# Patient Record
Sex: Female | Born: 1955 | Race: White | Hispanic: No | Marital: Married | Smoking: Current every day smoker
Health system: Southern US, Community
[De-identification: ages and names within clinical notes are randomized; demographics above are authoritative.]

## PROBLEM LIST (undated history)

## (undated) DIAGNOSIS — R000 Tachycardia, unspecified: Secondary | ICD-10-CM

## (undated) DIAGNOSIS — F32A Depression, unspecified: Secondary | ICD-10-CM

## (undated) DIAGNOSIS — N3946 Mixed incontinence: Secondary | ICD-10-CM

## (undated) DIAGNOSIS — R5383 Other fatigue: Secondary | ICD-10-CM

## (undated) DIAGNOSIS — G2581 Restless legs syndrome: Secondary | ICD-10-CM

## (undated) DIAGNOSIS — Z860101 Personal history of adenomatous and serrated colon polyps: Secondary | ICD-10-CM

## (undated) DIAGNOSIS — E785 Hyperlipidemia, unspecified: Secondary | ICD-10-CM

## (undated) DIAGNOSIS — Z8709 Personal history of other diseases of the respiratory system: Secondary | ICD-10-CM

## (undated) DIAGNOSIS — Z87448 Personal history of other diseases of urinary system: Secondary | ICD-10-CM

## (undated) DIAGNOSIS — N200 Calculus of kidney: Secondary | ICD-10-CM

## (undated) DIAGNOSIS — R058 Other specified cough: Secondary | ICD-10-CM

## (undated) DIAGNOSIS — Z8659 Personal history of other mental and behavioral disorders: Secondary | ICD-10-CM

## (undated) DIAGNOSIS — J449 Chronic obstructive pulmonary disease, unspecified: Secondary | ICD-10-CM

## (undated) DIAGNOSIS — Z973 Presence of spectacles and contact lenses: Secondary | ICD-10-CM

## (undated) DIAGNOSIS — D494 Neoplasm of unspecified behavior of bladder: Secondary | ICD-10-CM

## (undated) DIAGNOSIS — R3915 Urgency of urination: Secondary | ICD-10-CM

## (undated) DIAGNOSIS — F329 Major depressive disorder, single episode, unspecified: Secondary | ICD-10-CM

## (undated) DIAGNOSIS — R05 Cough: Secondary | ICD-10-CM

## (undated) DIAGNOSIS — F411 Generalized anxiety disorder: Secondary | ICD-10-CM

## (undated) DIAGNOSIS — J441 Chronic obstructive pulmonary disease with (acute) exacerbation: Secondary | ICD-10-CM

## (undated) DIAGNOSIS — I479 Paroxysmal tachycardia, unspecified: Secondary | ICD-10-CM

## (undated) DIAGNOSIS — G629 Polyneuropathy, unspecified: Secondary | ICD-10-CM

## (undated) DIAGNOSIS — N281 Cyst of kidney, acquired: Secondary | ICD-10-CM

## (undated) DIAGNOSIS — Z86010 Personal history of colonic polyps: Secondary | ICD-10-CM

## (undated) DIAGNOSIS — Z87442 Personal history of urinary calculi: Secondary | ICD-10-CM

## (undated) HISTORY — PX: EXTRACORPOREAL SHOCK WAVE LITHOTRIPSY: SHX1557

## (undated) HISTORY — DX: Restless legs syndrome: G25.81

## (undated) HISTORY — DX: Other fatigue: R53.83

## (undated) HISTORY — DX: Depression, unspecified: F32.A

## (undated) HISTORY — DX: Tachycardia, unspecified: R00.0

## (undated) HISTORY — DX: Major depressive disorder, single episode, unspecified: F32.9

## (undated) HISTORY — PX: OTHER SURGICAL HISTORY: SHX169

---
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
## 1981-09-07 HISTORY — PX: ABDOMINAL HYSTERECTOMY: SHX81

## 1990-09-07 HISTORY — PX: CARPAL TUNNEL RELEASE: SHX101

## 1992-09-07 HISTORY — PX: KNEE ARTHROSCOPY: SUR90

## 1992-09-07 HISTORY — PX: ULNAR NERVE REPAIR: SHX2594

## 1997-12-21 ENCOUNTER — Ambulatory Visit (HOSPITAL_BASED_OUTPATIENT_CLINIC_OR_DEPARTMENT_OTHER): Admission: RE | Admit: 1997-12-21 | Discharge: 1997-12-21 | Payer: Self-pay | Admitting: *Deleted

## 2000-12-22 ENCOUNTER — Ambulatory Visit (HOSPITAL_BASED_OUTPATIENT_CLINIC_OR_DEPARTMENT_OTHER): Admission: RE | Admit: 2000-12-22 | Discharge: 2000-12-22 | Payer: Self-pay | Admitting: *Deleted

## 2000-12-22 ENCOUNTER — Encounter (INDEPENDENT_AMBULATORY_CARE_PROVIDER_SITE_OTHER): Payer: Self-pay | Admitting: Specialist

## 2001-02-17 ENCOUNTER — Encounter: Payer: Self-pay | Admitting: Family Medicine

## 2001-02-17 ENCOUNTER — Encounter: Admission: RE | Admit: 2001-02-17 | Discharge: 2001-02-17 | Payer: Self-pay | Admitting: Family Medicine

## 2001-03-23 HISTORY — PX: OTHER SURGICAL HISTORY: SHX169

## 2001-08-29 ENCOUNTER — Encounter: Payer: Self-pay | Admitting: Family Medicine

## 2001-08-29 ENCOUNTER — Encounter: Admission: RE | Admit: 2001-08-29 | Discharge: 2001-08-29 | Payer: Self-pay | Admitting: Family Medicine

## 2001-09-01 ENCOUNTER — Encounter: Admission: RE | Admit: 2001-09-01 | Discharge: 2001-09-01 | Payer: Self-pay | Admitting: Neurosurgery

## 2001-09-01 ENCOUNTER — Encounter: Payer: Self-pay | Admitting: Neurosurgery

## 2001-09-16 ENCOUNTER — Encounter: Admission: RE | Admit: 2001-09-16 | Discharge: 2001-09-16 | Payer: Self-pay | Admitting: Neurosurgery

## 2001-09-16 ENCOUNTER — Encounter: Payer: Self-pay | Admitting: Neurosurgery

## 2001-10-03 ENCOUNTER — Encounter: Admission: RE | Admit: 2001-10-03 | Discharge: 2001-10-03 | Payer: Self-pay | Admitting: Neurosurgery

## 2001-10-03 ENCOUNTER — Encounter: Payer: Self-pay | Admitting: Neurosurgery

## 2002-01-12 ENCOUNTER — Encounter: Payer: Self-pay | Admitting: Family Medicine

## 2002-01-12 ENCOUNTER — Encounter: Admission: RE | Admit: 2002-01-12 | Discharge: 2002-01-12 | Payer: Self-pay | Admitting: Family Medicine

## 2006-04-22 ENCOUNTER — Ambulatory Visit: Payer: Self-pay | Admitting: Family Medicine

## 2006-06-09 ENCOUNTER — Ambulatory Visit: Payer: Self-pay | Admitting: Family Medicine

## 2006-09-30 ENCOUNTER — Ambulatory Visit (HOSPITAL_COMMUNITY): Admission: RE | Admit: 2006-09-30 | Discharge: 2006-09-30 | Payer: Self-pay | Admitting: Urology

## 2007-03-18 ENCOUNTER — Encounter: Admission: RE | Admit: 2007-03-18 | Discharge: 2007-03-18 | Payer: Self-pay | Admitting: Family Medicine

## 2011-01-23 NOTE — Op Note (Signed)
[place]  Patient:    Gabrielle Gallegos, Gabrielle Gallegos                     MRN: [id number] Proc. Date: 12/22/00 Adm. Date:  [id number] Attending:  Kendell Bane                           Operative Report  PREOPERATIVE DIAGNOSIS:  Mass, right little finger.  POSTOPERATIVE DIAGNOSIS:  Ganglion cyst, right little finger, proximal interphalangeal joint.  OPERATION PERFORMED:  Excision of ganglion, right little finger.  SURGEON:  Lowell Bouton, M.D.  ANESTHESIA:  0.5% Marcaine local with sedation.  OPERATIVE FINDINGS:  The patient had a small nodular mass over the dorsal ulnar aspect of the PIP joint of the right little finger.  DESCRIPTION OF PROCEDURE:  Under 0.5% Marcaine local anesthesia, with the tourniquet on the right arm, the right hand was prepped and draped in the usual fashion and after exsanguinating the limb the tourniquet was inflated to 250 mmHg.  A transverse incision was made across the dorsum of the PIP joint and blunt dissection carried through the subcutaneous tissues.  The ganglion was identified between the central slip and the ulnar lateral band.  It was excised along with a portion of the capsule.  The joint was examined and was found to be fairly smooth beneath the cyst.  The wound was then irrigated with saline.  The skin was closed with a 4-0 subcuticular Prolene.  Sterile dressings were applied and the tourniquet as released with good circulation to the hand.  The patient went to the recovery room awake and stable in good condition. DD:  12/22/00 TD:  12/22/00 Job: [id number]

## 2011-02-12 ENCOUNTER — Other Ambulatory Visit: Payer: Self-pay

## 2011-02-12 ENCOUNTER — Other Ambulatory Visit: Payer: Self-pay | Admitting: Family Medicine

## 2011-02-12 DIAGNOSIS — R110 Nausea: Secondary | ICD-10-CM

## 2011-02-13 ENCOUNTER — Other Ambulatory Visit: Payer: Self-pay

## 2011-02-13 ENCOUNTER — Ambulatory Visit
Admission: RE | Admit: 2011-02-13 | Discharge: 2011-02-13 | Disposition: A | Discharge status: Home / Self Care | Payer: Self-pay | Source: Ambulatory Visit | Attending: Family Medicine | Admitting: Family Medicine

## 2011-02-13 DIAGNOSIS — R110 Nausea: Secondary | ICD-10-CM

## 2011-02-23 ENCOUNTER — Other Ambulatory Visit: Payer: Self-pay | Admitting: Family Medicine

## 2011-02-25 ENCOUNTER — Other Ambulatory Visit: Payer: BC Managed Care – PPO

## 2012-06-21 ENCOUNTER — Other Ambulatory Visit: Payer: Self-pay | Admitting: Internal Medicine

## 2012-12-02 ENCOUNTER — Telehealth: Payer: Self-pay | Admitting: Nurse Practitioner

## 2012-12-02 ENCOUNTER — Other Ambulatory Visit: Payer: Self-pay | Admitting: Nurse Practitioner

## 2012-12-02 NOTE — Telephone Encounter (Signed)
Pt instructed to call pharmacy and have them send request over for refill and can take up to 72 hours. Pt verbalized understanding.

## 2012-12-05 ENCOUNTER — Encounter: Payer: Self-pay | Admitting: *Deleted

## 2012-12-05 NOTE — Telephone Encounter (Signed)
Last seen 09/08/12, last filled 10/31/12

## 2012-12-05 NOTE — Telephone Encounter (Signed)
This encounter was created in error - please disregard.

## 2012-12-27 ENCOUNTER — Other Ambulatory Visit: Payer: Self-pay | Admitting: Nurse Practitioner

## 2012-12-28 NOTE — Telephone Encounter (Signed)
PLEASE PRINT. CALL PT WHEN READY. LAST REFILL 11/05/12. LAST OV 1/14 WITH [facility].

## 2012-12-28 NOTE — Telephone Encounter (Signed)
Let patient know that RX ready for pick up

## 2012-12-28 NOTE — Telephone Encounter (Signed)
Patient aware.

## 2013-01-03 ENCOUNTER — Telehealth: Payer: Self-pay | Admitting: Nurse Practitioner

## 2013-01-03 NOTE — Telephone Encounter (Signed)
Mmm to address 

## 2013-01-03 NOTE — Telephone Encounter (Signed)
NTBS to discuss 

## 2013-01-04 ENCOUNTER — Telehealth: Payer: Self-pay | Admitting: *Deleted

## 2013-01-04 NOTE — Telephone Encounter (Signed)
Appt given to come in on 5-1 to discuss with MMM

## 2013-01-05 ENCOUNTER — Encounter: Payer: Self-pay | Admitting: Nurse Practitioner

## 2013-01-05 ENCOUNTER — Ambulatory Visit (INDEPENDENT_AMBULATORY_CARE_PROVIDER_SITE_OTHER): Payer: BC Managed Care – PPO | Admitting: Nurse Practitioner

## 2013-01-05 VITALS — BP 135/89 | HR 116 | Temp 97.7°F | Ht 63.75 in | Wt 145.0 lb

## 2013-01-05 DIAGNOSIS — G43909 Migraine, unspecified, not intractable, without status migrainosus: Secondary | ICD-10-CM | POA: Insufficient documentation

## 2013-01-05 DIAGNOSIS — M4802 Spinal stenosis, cervical region: Secondary | ICD-10-CM

## 2013-01-05 DIAGNOSIS — F32A Depression, unspecified: Secondary | ICD-10-CM | POA: Insufficient documentation

## 2013-01-05 DIAGNOSIS — F329 Major depressive disorder, single episode, unspecified: Secondary | ICD-10-CM

## 2013-01-05 NOTE — Progress Notes (Signed)
  Subjective:    Patient ID: Gabrielle Gallegos, female    DOB: Jun 08, 1956, 57 y.o.   MRN: [id number]  HPI Patient had Cervical disc surgery in February of 2013. Note was written by neurosurgeon stating that she needed help caring for her granddaughter whom is handicapped. [employer] only has down that she needs 24 hrs a week of help . She is here staing she needs to up the hours because she is having constatnt back pain and cervical neck pain from having to lift granddaughter to provide ADL.    Review of Systems  All other systems reviewed and are negative.       Objective:   Physical Exam  NO PHYSICAL EXAM DONE      Assessment & Plan:  LETTER WRITTEN FOR {ATIENT TO IVE TO [employer].

## 2013-01-18 ENCOUNTER — Telehealth: Payer: Self-pay | Admitting: Nurse Practitioner

## 2013-01-18 MED ORDER — HYDROCODONE-ACETAMINOPHEN 5-325 MG PO TABS: 1.0000 | ORAL_TABLET | Freq: Two times a day (BID) | ORAL | Status: DC

## 2013-01-18 NOTE — Telephone Encounter (Signed)
Patient aware.

## 2013-01-18 NOTE — Telephone Encounter (Signed)
RX ready for pick-up. Inform patient that she is only to get pain meds from me and no other office.

## 2013-01-18 NOTE — Telephone Encounter (Signed)
Mmm to address 

## 2013-02-15 ENCOUNTER — Telehealth: Payer: Self-pay | Admitting: Nurse Practitioner

## 2013-02-15 MED ORDER — HYDROCODONE-ACETAMINOPHEN 5-325 MG PO TABS: 1.0000 | ORAL_TABLET | Freq: Two times a day (BID) | ORAL | Status: DC

## 2013-02-15 NOTE — Telephone Encounter (Signed)
rx ready for pickup 

## 2013-02-15 NOTE — Telephone Encounter (Signed)
Pt aware - rx up front 

## 2013-03-16 ENCOUNTER — Telehealth: Payer: Self-pay | Admitting: Nurse Practitioner

## 2013-03-17 ENCOUNTER — Other Ambulatory Visit: Payer: Self-pay | Admitting: *Deleted

## 2013-03-17 MED ORDER — HYDROCODONE-ACETAMINOPHEN 5-325 MG PO TABS: 1.0000 | ORAL_TABLET | Freq: Two times a day (BID) | ORAL | Status: DC

## 2013-03-17 NOTE — Telephone Encounter (Signed)
rx ready for pickup 

## 2013-03-17 NOTE — Telephone Encounter (Signed)
Last filled 02/15/13, last seen 01/05/13. Call pt to pickup if approved

## 2013-03-17 NOTE — Telephone Encounter (Signed)
Aware. 

## 2013-04-17 ENCOUNTER — Other Ambulatory Visit: Payer: Self-pay | Admitting: Nurse Practitioner

## 2013-04-17 MED ORDER — HYDROCODONE-ACETAMINOPHEN 5-325 MG PO TABS: 1.0000 | ORAL_TABLET | Freq: Two times a day (BID) | ORAL | Status: DC

## 2013-04-17 NOTE — Telephone Encounter (Signed)
Last seen 01/05/13, last filled 03/17/13. Will print, call pt when ready

## 2013-04-17 NOTE — Telephone Encounter (Signed)
rx ready for pickup 

## 2013-04-17 NOTE — Telephone Encounter (Signed)
Pt aware to pick up rx 

## 2013-05-18 ENCOUNTER — Other Ambulatory Visit: Payer: Self-pay | Admitting: Nurse Practitioner

## 2013-05-19 MED ORDER — HYDROCODONE-ACETAMINOPHEN 5-325 MG PO TABS: 1.0000 | ORAL_TABLET | Freq: Two times a day (BID) | ORAL | Status: DC

## 2013-05-19 NOTE — Telephone Encounter (Signed)
rx ready for pick up NTBS for future refills 

## 2013-05-19 NOTE — Telephone Encounter (Signed)
Last seen 01/05/13, last filled 04/17/13, will print call pt to pickup

## 2013-05-22 NOTE — Telephone Encounter (Signed)
Rx up front. Patient notified 

## 2013-06-19 ENCOUNTER — Encounter (INDEPENDENT_AMBULATORY_CARE_PROVIDER_SITE_OTHER): Payer: Self-pay

## 2013-06-19 ENCOUNTER — Ambulatory Visit (INDEPENDENT_AMBULATORY_CARE_PROVIDER_SITE_OTHER): Payer: BC Managed Care – PPO

## 2013-06-19 ENCOUNTER — Encounter: Payer: Self-pay | Admitting: Family Medicine

## 2013-06-19 ENCOUNTER — Ambulatory Visit (INDEPENDENT_AMBULATORY_CARE_PROVIDER_SITE_OTHER): Payer: BC Managed Care – PPO | Admitting: Family Medicine

## 2013-06-19 VITALS — BP 106/69 | HR 110 | Temp 98.2°F | Ht 64.0 in | Wt 147.0 lb

## 2013-06-19 DIAGNOSIS — R059 Cough, unspecified: Secondary | ICD-10-CM

## 2013-06-19 DIAGNOSIS — J209 Acute bronchitis, unspecified: Secondary | ICD-10-CM

## 2013-06-19 DIAGNOSIS — F17200 Nicotine dependence, unspecified, uncomplicated: Secondary | ICD-10-CM

## 2013-06-19 DIAGNOSIS — R05 Cough: Secondary | ICD-10-CM

## 2013-06-19 MED ORDER — BENZONATATE 200 MG PO CAPS: 200.0000 mg | ORAL_CAPSULE | Freq: Two times a day (BID) | ORAL | Status: DC | PRN

## 2013-06-19 MED ORDER — SULFAMETHOXAZOLE-TMP DS 800-160 MG PO TABS: 1.0000 | ORAL_TABLET | Freq: Two times a day (BID) | ORAL | Status: DC

## 2013-06-19 NOTE — Patient Instructions (Signed)
Smoking Cessation Quitting smoking is important to your health and has many advantages. However, it is not always easy to quit since nicotine is a very addictive drug. Often times, people try 3 times or more before being able to quit. This document explains the best ways for you to prepare to quit smoking. Quitting takes hard work and a lot of effort, but you can do it. ADVANTAGES OF QUITTING SMOKING  You will live longer, feel better, and live better.  Your body will feel the impact of quitting smoking almost immediately.  Within 20 minutes, blood pressure decreases. Your pulse returns to its normal level.  After 8 hours, carbon monoxide levels in the blood return to normal. Your oxygen level increases.  After 24 hours, the chance of having a heart attack starts to decrease. Your breath, hair, and body stop smelling like smoke.  After 48 hours, damaged nerve endings begin to recover. Your sense of taste and smell improve.  After 72 hours, the body is virtually free of nicotine. Your bronchial tubes relax and breathing becomes easier.  After 2 to 12 weeks, lungs can hold more air. Exercise becomes easier and circulation improves.  The risk of having a heart attack, stroke, cancer, or lung disease is greatly reduced.  After 1 year, the risk of coronary heart disease is cut in half.  After 5 years, the risk of stroke falls to the same as a nonsmoker.  After 10 years, the risk of lung cancer is cut in half and the risk of other cancers decreases significantly.  After 15 years, the risk of coronary heart disease drops, usually to the level of a nonsmoker.  If you are pregnant, quitting smoking will improve your chances of having a healthy baby.  The people you live with, especially any children, will be healthier.  You will have extra money to spend on things other than cigarettes. QUESTIONS TO THINK ABOUT BEFORE ATTEMPTING TO QUIT You may want to talk about your answers with your  caregiver.  Why do you want to quit?  If you tried to quit in the past, what helped and what did not?  What will be the most difficult situations for you after you quit? How will you plan to handle them?  Who can help you through the tough times? Your family? Friends? A caregiver?  What pleasures do you get from smoking? What ways can you still get pleasure if you quit? Here are some questions to ask your caregiver:  How can you help me to be successful at quitting?  What medicine do you think would be best for me and how should I take it?  What should I do if I need more help?  What is smoking withdrawal like? How can I get information on withdrawal? GET READY  Set a quit date.  Change your environment by getting rid of all cigarettes, ashtrays, matches, and lighters in your home, car, or work. Do not let people smoke in your home.  Review your past attempts to quit. Think about what worked and what did not. GET SUPPORT AND ENCOURAGEMENT You have a better chance of being successful if you have help. You can get support in many ways.  Tell your family, friends, and co-workers that you are going to quit and need their support. Ask them not to smoke around you.  Get individual, group, or telephone counseling and support. Programs are available at local hospitals and health centers. Call your local health department for   information about programs in your area.  Spiritual beliefs and practices may help some smokers quit.  Download a "quit meter" on your computer to keep track of quit statistics, such as how long you have gone without smoking, cigarettes not smoked, and money saved.  Get a self-help book about quitting smoking and staying off of tobacco. LEARN NEW SKILLS AND BEHAVIORS  Distract yourself from urges to smoke. Talk to someone, go for a walk, or occupy your time with a task.  Change your normal routine. Take a different route to work. Drink tea instead of coffee.  Eat breakfast in a different place.  Reduce your stress. Take a hot bath, exercise, or read a book.  Plan something enjoyable to do every day. Reward yourself for not smoking.  Explore interactive web-based programs that specialize in helping you quit. GET MEDICINE AND USE IT CORRECTLY Medicines can help you stop smoking and decrease the urge to smoke. Combining medicine with the above behavioral methods and support can greatly increase your chances of successfully quitting smoking.  Nicotine replacement therapy helps deliver nicotine to your body without the negative effects and risks of smoking. Nicotine replacement therapy includes nicotine gum, lozenges, inhalers, nasal sprays, and skin patches. Some may be available over-the-counter and others require a prescription.  Antidepressant medicine helps people abstain from smoking, but how this works is unknown. This medicine is available by prescription.  Nicotinic receptor partial agonist medicine simulates the effect of nicotine in your brain. This medicine is available by prescription. Ask your caregiver for advice about which medicines to use and how to use them based on your health history. Your caregiver will tell you what side effects to look out for if you choose to be on a medicine or therapy. Carefully read the information on the package. Do not use any other product containing nicotine while using a nicotine replacement product.  RELAPSE OR DIFFICULT SITUATIONS Most relapses occur within the first 3 months after quitting. Do not be discouraged if you start smoking again. Remember, most people try several times before finally quitting. You may have symptoms of withdrawal because your body is used to nicotine. You may crave cigarettes, be irritable, feel very hungry, cough often, get headaches, or have difficulty concentrating. The withdrawal symptoms are only temporary. They are strongest when you first quit, but they will go away within  10 14 days. To reduce the chances of relapse, try to:  Avoid drinking alcohol. Drinking lowers your chances of successfully quitting.  Reduce the amount of caffeine you consume. Once you quit smoking, the amount of caffeine in your body increases and can give you symptoms, such as a rapid heartbeat, sweating, and anxiety.  Avoid smokers because they can make you want to smoke.  Do not let weight gain distract you. Many smokers will gain weight when they quit, usually less than 10 pounds. Eat a healthy diet and stay active. You can always lose the weight gained after you quit.  Find ways to improve your mood other than smoking. FOR MORE INFORMATION  www.smokefree.gov  Document Released: 08/18/2001 Document Revised: 02/23/2012 Document Reviewed: 12/03/2011 [employer] Patient Information 2014 [employer].  

## 2013-06-19 NOTE — Progress Notes (Signed)
Patient ID: Gabrielle Gallegos, female   DOB: Jul 31, 1956, 57 y.o.   MRN: [id number] SUBJECTIVE: CC: Chief Complaint  Patient presents with  . Acute Visit    cough and congestion     HPI: Has cough and congestion. Patient is a smoker. Started with a scratchy throat and nasal congestion. Then cough and congestion. Worse at night. No fever , no chills.    Past Medical History  Diagnosis Date  . Depression   . Fatigue   . Migraines    Past Surgical History  Procedure Laterality Date  . Knee surgery    . Abdominal hysterectomy    . Carpal tunnel release    . Left arm surgery     History   Social History  . Marital Status: Married    Spouse Name: N/A    Number of Children: N/A  . Years of Education: N/A   Occupational History  . Not on file.   Social History Main Topics  . Smoking status: Current Every Day Smoker  . Smokeless tobacco: Never Used  . Alcohol Use: No  . Drug Use: No  . Sexual Activity: Not on file   Other Topics Concern  . Not on file   Social History Narrative  . No narrative on file   Family History  Problem Relation Age of Onset  . Heart disease Mother   . Cancer Father    Current Outpatient Prescriptions on File Prior to Visit  Medication Sig Dispense Refill  . clonazePAM (KLONOPIN) 1 MG tablet Take 1 mg by mouth at bedtime.       . diazepam (VALIUM) 5 MG tablet Take 5 mg by mouth every 12 (twelve) hours as needed.       Marland Kitchen rOPINIRole (REQUIP) 2 MG tablet Take 2 mg by mouth 3 (three) times daily.       Marland Kitchen escitalopram (LEXAPRO) 20 MG tablet Take 20 mg by mouth daily.       Marland Kitchen gabapentin (NEURONTIN) 300 MG capsule Take 300 mg by mouth daily.        No current facility-administered medications on file prior to visit.   No Known Allergies  There is no immunization history on file for this patient. Prior to Admission medications   Medication Sig Start Date End Date Taking? Authorizing Provider  clonazePAM (KLONOPIN) 1 MG tablet Take 1 mg by mouth  at bedtime.  12/28/12  Yes Historical Provider, MD  diazepam (VALIUM) 5 MG tablet Take 5 mg by mouth every 12 (twelve) hours as needed.  01/03/13  Yes Historical Provider, MD  rOPINIRole (REQUIP) 2 MG tablet Take 2 mg by mouth 3 (three) times daily.  12/22/12  Yes Historical Provider, MD  escitalopram (LEXAPRO) 20 MG tablet Take 20 mg by mouth daily.  10/04/12   Historical Provider, MD  gabapentin (NEURONTIN) 300 MG capsule Take 300 mg by mouth daily.  12/22/12   Historical Provider, MD  HYDROcodone-acetaminophen (NORCO/VICODIN) 5-325 MG per tablet Take 1 tablet by mouth 2 (two) times daily. DR [place] 05/19/13   Historical Provider, MD     ROS: As above in the HPI. All other systems are stable or negative.  OBJECTIVE: APPEARANCE:  Patient in no acute distress.The patient appeared well nourished and normally developed. Acyanotic. Waist: VITAL SIGNS:BP 106/69  Pulse 110  Temp(Src) 98.2 F (36.8 C) (Oral)  Ht 5\' 4"  (1.626 m)  Wt 147 lb (66.679 kg)  BMI 25.22 kg/m2  WF Smells of tobacco.  SKIN: warm and  Dry without overt rashes, tattoos and scars  HEAD and Neck: without JVD, Head and scalp: normal Eyes:No scleral icterus. Fundi normal, eye movements normal. Ears: Auricle normal, canal normal, Tympanic membranes normal, insufflation normal. Nose: normal Throat: normal Neck & thyroid: normal  CHEST & LUNGS: Chest wall: normal Lungs: Coarse breath sounds, rhonchi, no wheezes.mno Rales.   CVS: Reveals the PMI to be normally located. Regular rhythm, First and Second Heart sounds are normal,  absence of murmurs, rubs or gallops. Peripheral vasculature: Radial pulses: normal Dorsal pedis pulses: normal Posterior pulses: normal  ABDOMEN:  Appearance: normal Benign, no organomegaly, no masses, no Abdominal Aortic enlargement. No Guarding , no rebound. No Bruits. Bowel sounds: normal  RECTAL: N/A GU: N/A  EXTREMETIES: nonedematous.  NEUROLOGIC: oriented to time,place and  person; nonfocal.  ASSESSMENT: Cough - Plan: DG Chest 2 View, sulfamethoxazole-trimethoprim (BACTRIM DS) 800-160 MG per tablet, benzonatate (TESSALON) 200 MG capsule  Acute bronchitis - Plan: sulfamethoxazole-trimethoprim (BACTRIM DS) 800-160 MG per tablet  Tobacco use disorder   PLAN:  Orders Placed This Encounter  Procedures  . DG Chest 2 View    Standing Status: Future     Number of Occurrences: 1     Standing Expiration Date: 08/19/2014    Order Specific Question:  Reason for Exam (SYMPTOM  OR DIAGNOSIS REQUIRED)    Answer:  cough    Order Specific Question:  Is the patient pregnant?    Answer:  No    Order Specific Question:  Preferred imaging location?    Answer:  Internal    Meds ordered this encounter  Medications  . HYDROcodone-acetaminophen (NORCO/VICODIN) 5-325 MG per tablet    Sig: Take 1 tablet by mouth 2 (two) times daily. DR [place]  . sulfamethoxazole-trimethoprim (BACTRIM DS) 800-160 MG per tablet    Sig: Take 1 tablet by mouth 2 (two) times daily.    Dispense:  20 tablet    Refill:  0  . benzonatate (TESSALON) 200 MG capsule    Sig: Take 1 capsule (200 mg total) by mouth 2 (two) times daily as needed for cough.    Dispense:  20 capsule    Refill:  0    Fluids rest   Smoking cessation discussed and handout in the AVS.  Return if symptoms worsen or fail to improve.  Shawnae Leiva P. Modesto Charon, M.D.

## 2013-06-20 NOTE — Progress Notes (Signed)
Patient ID: Gabrielle Gallegos, female   DOB: [birth year], 57 y.o.   MRN: [id number] [facility] reading (PRIMARY) by  Dr. Modesto Charon: Chest Xray yesterday: increased markings . Bronchitis. No other significant findings. Patient is a smoker.                             Gabrielle Gallegos P. [place]esto Charon, M.D.

## 2013-06-21 ENCOUNTER — Other Ambulatory Visit: Payer: Self-pay | Admitting: Nurse Practitioner

## 2013-06-21 ENCOUNTER — Telehealth: Payer: Self-pay | Admitting: Family Medicine

## 2013-06-22 MED ORDER — HYDROCODONE-ACETAMINOPHEN 5-325 MG PO TABS: 1.0000 | ORAL_TABLET | Freq: Two times a day (BID) | ORAL | Status: DC

## 2013-06-22 NOTE — Telephone Encounter (Signed)
Last filled 05/19/13, last seen by you 05/14

## 2013-06-22 NOTE — Telephone Encounter (Signed)
rx ready for pickup 

## 2013-06-22 NOTE — Telephone Encounter (Signed)
Cannot give her anything else due to norco, valium and clonazepam. 1 teaspoon of buckwheat honey 4 x  A day.

## 2013-06-23 NOTE — Telephone Encounter (Signed)
UP front

## 2013-06-23 NOTE — Telephone Encounter (Signed)
Pt.notified

## 2013-06-27 ENCOUNTER — Ambulatory Visit (INDEPENDENT_AMBULATORY_CARE_PROVIDER_SITE_OTHER): Payer: BC Managed Care – PPO | Admitting: Family Medicine

## 2013-06-27 ENCOUNTER — Encounter: Payer: Self-pay | Admitting: Family Medicine

## 2013-06-27 VITALS — BP 111/74 | HR 113 | Temp 98.5°F | Ht 63.5 in | Wt 148.0 lb

## 2013-06-27 DIAGNOSIS — R1031 Right lower quadrant pain: Secondary | ICD-10-CM

## 2013-06-27 DIAGNOSIS — Z7189 Other specified counseling: Secondary | ICD-10-CM

## 2013-06-27 DIAGNOSIS — IMO0002 Reserved for concepts with insufficient information to code with codable children: Secondary | ICD-10-CM

## 2013-06-27 DIAGNOSIS — S39011A Strain of muscle, fascia and tendon of abdomen, initial encounter: Secondary | ICD-10-CM

## 2013-06-27 DIAGNOSIS — F17200 Nicotine dependence, unspecified, uncomplicated: Secondary | ICD-10-CM

## 2013-06-27 DIAGNOSIS — Z716 Tobacco abuse counseling: Secondary | ICD-10-CM

## 2013-06-27 DIAGNOSIS — J40 Bronchitis, not specified as acute or chronic: Secondary | ICD-10-CM

## 2013-06-27 LAB — POCT CBC
Granulocyte percent: 73.4 %G (ref 37–80)
HCT, POC: 42 % (ref 37.7–47.9)
Hemoglobin: 13.8 g/dL (ref 12.2–16.2)
Lymph, poc: 2.4 (ref 0.6–3.4)
MCH, POC: 28.4 pg (ref 27–31.2)
MCHC: 33 g/dL (ref 31.8–35.4)
MCV: 86.1 fL (ref 80–97)
MPV: 8.1 fL (ref 0–99.8)
POC Granulocyte: 8.7 — AB (ref 2–6.9)
POC LYMPH PERCENT: 20.3 %L (ref 10–50)
Platelet Count, POC: 232 10*3/uL (ref 142–424)
RBC: 4.9 M/uL (ref 4.04–5.48)
RDW, POC: 14.4 %
WBC: 11.8 10*3/uL — AB (ref 4.6–10.2)

## 2013-06-27 LAB — POCT URINALYSIS DIPSTICK
Bilirubin, UA: NEGATIVE
Blood, UA: NEGATIVE
Glucose, UA: NEGATIVE
Ketones, UA: NEGATIVE
Leukocytes, UA: NEGATIVE
Nitrite, UA: NEGATIVE
Protein, UA: NEGATIVE
Spec Grav, UA: 1.02
Urobilinogen, UA: NEGATIVE
pH, UA: 5

## 2013-06-27 MED ORDER — PREDNISONE 50 MG PO TABS: ORAL_TABLET | ORAL | Status: DC

## 2013-06-27 MED ORDER — SODIUM CHLORIDE 0.9 % IV SOLN: 125.0000 mg | Freq: Once | INTRAVENOUS | Status: DC

## 2013-06-27 MED ORDER — METHYLPREDNISOLONE SODIUM SUCC 125 MG IJ SOLR
125.0000 mg | Freq: Once | INTRAMUSCULAR | Status: AC
  Administered 2013-06-27: 125 mg via INTRAVENOUS

## 2013-06-27 MED ORDER — GUAIFENESIN-CODEINE 100-10 MG/5ML PO SOLN: 5.0000 mL | Freq: Three times a day (TID) | ORAL | Status: DC | PRN

## 2013-06-27 NOTE — Progress Notes (Signed)
  Subjective:    Patient ID: Gabrielle Gallegos, female    DOB: 07/15/1956, 57 y.o.   MRN: [id number]  HPI Patient presents today with chief complaint of cough and rectal quadrant pain. Patient was seen last week for upper respiratory symptoms. Was diagnosed with bronchitis. Patient was placed on Bactrim as well as Tessalon Perles for cough. Patient is a symptoms are mildly improved though cough is persistent. Basal history of one pack per day smoking. Greater than 30+ pack years smoking history. Has a progressive right lower quadrant pain it seems to be worse with coughing. Some wheezing. No shortness of breath. No fevers or chills. Patient denies any dysuria, diarrhea, constipation. Patient states that coughing and Abilify has woke her up secondary to abdominal pain.   Review of Systems  All other systems reviewed and are negative.       Objective:   Physical Exam  Constitutional: She appears well-developed and well-nourished.  HENT:  Head: Normocephalic and atraumatic.  Eyes: Conjunctivae are normal. Pupils are equal, round, and reactive to light.  Neck: Normal range of motion. Neck supple.  Cardiovascular: Normal rate and regular rhythm.   Pulmonary/Chest: Effort normal. She has wheezes.  Abdominal: Soft. Bowel sounds are normal.  + RLQ TTP, 10/10 pain  + pain with hip flexion and increased intraabdominal pressure.    Musculoskeletal: Normal range of motion.  Neurological: She is alert.          Assessment & Plan:  Abdominal pain, RLQ - Plan: CT Abdomen Pelvis W Contrast, Comprehensive metabolic panel, POCT CBC  Bronchitis - Plan: methylPREDNISolone sodium succinate (SOLU-MEDROL) 130 mg in sodium chloride 0.9 % 50 mL IVPB, predniSONE (DELTASONE) 50 MG tablet, guaiFENesin-codeine 100-10 MG/5ML syrup  Tobacco abuse counseling  Abdominal wall strain, initial encounter  Suspect viral induced mild COPD exacerbation. Complete course of Bactrim. Solu-Medrol 125 mg IM x1.  Prednisone x7 days. Right lower quadrant abdominal pain likely abdominal wall strain from her recurrent cough. However, given the severity of pain on exam, will obtain CT of the abdomen and pelvis to further evaluate anatomy. No urinary sxs today. Check CBC as well as a CMP. Codeine with guaifenesin for cough. Discussed smoking cessation at length. Will likely need outpatient PFTs in 4-6 weeks pending resolution of symptoms. Discussed general an infectious/respiratory reflux. Follow up as needed

## 2013-06-27 NOTE — Addendum Note (Signed)
Addended by: Gwenith Daily on: 06/27/2013 02:21 PM   Modules accepted: Orders

## 2013-06-27 NOTE — Patient Instructions (Signed)
Bronchitis Bronchitis is the body's way of reacting to injury and/or infection (inflammation) of the bronchi. Bronchi are the air tubes that extend from the windpipe into the lungs. If the inflammation becomes severe, it may cause shortness of breath. CAUSES  Inflammation may be caused by:  A virus.  Germs (bacteria).  Dust.  Allergens.  Pollutants and many other irritants. The cells lining the bronchial tree are covered with tiny hairs (cilia). These constantly beat upward, away from the lungs, toward the mouth. This keeps the lungs free of pollutants. When these cells become too irritated and are unable to do their job, mucus begins to develop. This causes the characteristic cough of bronchitis. The cough clears the lungs when the cilia are unable to do their job. Without either of these protective mechanisms, the mucus would settle in the lungs. Then you would develop pneumonia. Smoking is a common cause of bronchitis and can contribute to pneumonia. Stopping this habit is the single most important thing you can do to help yourself. TREATMENT   Your caregiver may prescribe an antibiotic if the cough is caused by bacteria. Also, medicines that open up your airways make it easier to breathe. Your caregiver may also recommend or prescribe an expectorant. It will loosen the mucus to be coughed up. Only take over-the-counter or prescription medicines for pain, discomfort, or fever as directed by your caregiver.  Removing whatever causes the problem (smoking, for example) is critical to preventing the problem from getting worse.  Cough suppressants may be prescribed for relief of cough symptoms.  Inhaled medicines may be prescribed to help with symptoms now and to help prevent problems from returning.  For those with recurrent (chronic) bronchitis, there may be a need for steroid medicines. SEEK IMMEDIATE MEDICAL CARE IF:   During treatment, you develop more pus-like mucus (purulent  sputum).  You have a fever.  Your baby is older than 3 months with a rectal temperature of 102 F (38.9 C) or higher.  Your baby is 58 months old or younger with a rectal temperature of 100.4 F (38 C) or higher.  You become progressively more ill.  You have increased difficulty breathing, wheezing, or shortness of breath. It is necessary to seek immediate medical care if you are elderly or sick from any other disease. MAKE SURE YOU:   Understand these instructions.  Will watch your condition.  Will get help right away if you are not doing well or get worse. Document Released: 08/24/2005 Document Revised: 11/16/2011 Document Reviewed: 07/03/2008 [facility] Patient Information 2014 [place], [place].  Smoking Cessation Quitting smoking is important to your health and has many advantages. However, it is not always easy to quit since nicotine is a very addictive drug. Often times, people try 3 times or more before being able to quit. This document explains the best ways for you to prepare to quit smoking. Quitting takes hard work and a lot of effort, but you can do it. ADVANTAGES OF QUITTING SMOKING  You will live longer, feel better, and live better.  Your body will feel the impact of quitting smoking almost immediately.  Within 20 minutes, blood pressure decreases. Your pulse returns to its normal level.  After 8 hours, carbon monoxide levels in the blood return to normal. Your oxygen level increases.  After 24 hours, the chance of having a heart attack starts to decrease. Your breath, hair, and body stop smelling like smoke.  After 48 hours, damaged nerve endings begin to recover. Your sense  of taste and smell improve.  After 72 hours, the body is virtually free of nicotine. Your bronchial tubes relax and breathing becomes easier.  After 2 to 12 weeks, lungs can hold more air. Exercise becomes easier and circulation improves.  The risk of having a heart attack, stroke, cancer,  or lung disease is greatly reduced.  After 1 year, the risk of coronary heart disease is cut in half.  After 5 years, the risk of stroke falls to the same as a nonsmoker.  After 10 years, the risk of lung cancer is cut in half and the risk of other cancers decreases significantly.  After 15 years, the risk of coronary heart disease drops, usually to the level of a nonsmoker.  If you are pregnant, quitting smoking will improve your chances of having a healthy baby.  The people you live with, especially any children, will be healthier.  You will have extra money to spend on things other than cigarettes. QUESTIONS TO THINK ABOUT BEFORE ATTEMPTING TO QUIT You may want to talk about your answers with your caregiver.  Why do you want to quit?  If you tried to quit in the past, what helped and what did not?  What will be the most difficult situations for you after you quit? How will you plan to handle them?  Who can help you through the tough times? Your family? Friends? A caregiver?  What pleasures do you get from smoking? What ways can you still get pleasure if you quit? Here are some questions to ask your caregiver:  How can you help me to be successful at quitting?  What medicine do you think would be best for me and how should I take it?  What should I do if I need more help?  What is smoking withdrawal like? How can I get information on withdrawal? GET READY  Set a quit date.  Change your environment by getting rid of all cigarettes, ashtrays, matches, and lighters in your home, car, or work. Do not let people smoke in your home.  Review your past attempts to quit. Think about what worked and what did not. GET SUPPORT AND ENCOURAGEMENT You have a better chance of being successful if you have help. You can get support in many ways.  Tell your family, friends, and co-workers that you are going to quit and need their support. Ask them not to smoke around you.  Get  individual, group, or telephone counseling and support. Programs are available at [employer] and health centers. Call your local health department for information about programs in your area.  Spiritual beliefs and practices may help some smokers quit.  Download a "quit meter" on your computer to keep track of quit statistics, such as how long you have gone without smoking, cigarettes not smoked, and money saved.  Get a self-help book about quitting smoking and staying off of tobacco. LEARN NEW SKILLS AND BEHAVIORS  Distract yourself from urges to smoke. Talk to someone, go for a walk, or occupy your time with a task.  Change your normal routine. Take a different route to work. Drink tea instead of coffee. Eat breakfast in a different place.  Reduce your stress. Take a hot bath, exercise, or read a book.  Plan something enjoyable to do every day. Reward yourself for not smoking.  Explore interactive web-based programs that specialize in helping you quit. GET MEDICINE AND USE IT CORRECTLY Medicines can help you stop smoking and decrease the urge  to smoke. Combining medicine with the above behavioral methods and support can greatly increase your chances of successfully quitting smoking.  Nicotine replacement therapy helps deliver nicotine to your body without the negative effects and risks of smoking. Nicotine replacement therapy includes nicotine gum, lozenges, inhalers, nasal sprays, and skin patches. Some may be available over-the-counter and others require a prescription.  Antidepressant medicine helps people abstain from smoking, but how this works is unknown. This medicine is available by prescription.  Nicotinic receptor partial agonist medicine simulates the effect of nicotine in your brain. This medicine is available by prescription. Ask your caregiver for advice about which medicines to use and how to use them based on your health history. Your caregiver will tell you what side  effects to look out for if you choose to be on a medicine or therapy. Carefully read the information on the package. Do not use any other product containing nicotine while using a nicotine replacement product.  RELAPSE OR DIFFICULT SITUATIONS Most relapses occur within the first 3 months after quitting. Do not be discouraged if you start smoking again. Remember, most people try several times before finally quitting. You may have symptoms of withdrawal because your body is used to nicotine. You may crave cigarettes, be irritable, feel very hungry, cough often, get headaches, or have difficulty concentrating. The withdrawal symptoms are only temporary. They are strongest when you first quit, but they will go away within 10 14 days. To reduce the chances of relapse, try to:  Avoid drinking alcohol. Drinking lowers your chances of successfully quitting.  Reduce the amount of caffeine you consume. Once you quit smoking, the amount of caffeine in your body increases and can give you symptoms, such as a rapid heartbeat, sweating, and anxiety.  Avoid smokers because they can make you want to smoke.  Do not let weight gain distract you. Many smokers will gain weight when they quit, usually less than 10 pounds. Eat a healthy diet and stay active. You can always lose the weight gained after you quit.  Find ways to improve your mood other than smoking. FOR MORE INFORMATION  www.smokefree.gov  Document Released: 08/18/2001 Document Revised: 02/23/2012 Document Reviewed: 12/03/2011 [facility] Patient Information 2014 [place], [place].  Muscle Strain Muscle strain occurs when a muscle is stretched beyond its normal length. A small number of muscle fibers generally are torn. This is especially common in athletes. This happens when a sudden, violent force placed on a muscle stretches it too far. Usually, recovery from muscle strain takes 1 to 2 weeks. Complete healing will take 5 to 6 weeks.  HOME CARE INSTRUCTIONS    While awake, apply ice to the sore muscle for the first 2 days after the injury.  Put ice in a plastic bag.  Place a towel between your skin and the bag.  Leave the ice on for 15-20 minutes each hour.  Do not use the strained muscle for several days, until you no longer have pain.  You may wrap the injured area with an elastic bandage for comfort. Be careful not to wrap it too tightly. This may interfere with blood circulation or increase swelling.  Only take over-the-counter or prescription medicines for pain, discomfort, or fever as directed by your caregiver. SEEK MEDICAL CARE IF:  You have increasing pain or swelling in the injured area. MAKE SURE YOU:   Understand these instructions.  Will watch your condition.  Will get help right away if you are not doing well or get  worse. Document Released: 08/24/2005 Document Revised: 11/16/2011 Document Reviewed: 09/05/2011 [facility] Patient Information 2014 [place], [place].

## 2013-06-28 LAB — COMPREHENSIVE METABOLIC PANEL
ALT: 14 IU/L (ref 0–32)
AST: 23 IU/L (ref 0–40)
Albumin/Globulin Ratio: 1.3 (ref 1.1–2.5)
Albumin: 3.9 g/dL (ref 3.5–5.5)
Alkaline Phosphatase: 114 IU/L (ref 39–117)
BUN/Creatinine Ratio: 18 (ref 9–23)
BUN: 11 mg/dL (ref 6–24)
CO2: 26 mmol/L (ref 18–29)
Calcium: 9.2 mg/dL (ref 8.7–10.2)
Chloride: 97 mmol/L (ref 97–108)
Creatinine, Ser: 0.62 mg/dL (ref 0.57–1.00)
GFR calc Af Amer: 117 mL/min/{1.73_m2} (ref >59)
GFR calc non Af Amer: 101 mL/min/{1.73_m2} (ref >59)
Globulin, Total: 3 g/dL (ref 1.5–4.5)
Glucose: 102 mg/dL — ABNORMAL HIGH (ref 65–99)
Potassium: 4.8 mmol/L (ref 3.5–5.2)
Sodium: 138 mmol/L (ref 134–144)
Total Bilirubin: <0.2 mg/dL (ref 0.0–1.2)
Total Protein: 6.9 g/dL (ref 6.0–8.5)

## 2013-07-04 ENCOUNTER — Ambulatory Visit (HOSPITAL_COMMUNITY)
Admission: RE | Admit: 2013-07-04 | Discharge: 2013-07-04 | Disposition: A | Discharge status: Home / Self Care | Payer: BC Managed Care – PPO | Source: Ambulatory Visit | Attending: Family Medicine | Admitting: Family Medicine

## 2013-07-04 DIAGNOSIS — R1031 Right lower quadrant pain: Secondary | ICD-10-CM

## 2013-07-04 DIAGNOSIS — R109 Unspecified abdominal pain: Secondary | ICD-10-CM | POA: Insufficient documentation

## 2013-07-04 DIAGNOSIS — N289 Disorder of kidney and ureter, unspecified: Secondary | ICD-10-CM | POA: Insufficient documentation

## 2013-07-04 DIAGNOSIS — K7689 Other specified diseases of liver: Secondary | ICD-10-CM | POA: Insufficient documentation

## 2013-07-04 DIAGNOSIS — N200 Calculus of kidney: Secondary | ICD-10-CM | POA: Insufficient documentation

## 2013-07-04 MED ORDER — IOHEXOL 300 MG/ML  SOLN
100.0000 mL | Freq: Once | INTRAMUSCULAR | Status: AC | PRN
  Administered 2013-07-04: 100 mL via INTRAVENOUS

## 2013-07-06 ENCOUNTER — Telehealth: Payer: Self-pay | Admitting: Family Medicine

## 2013-07-10 NOTE — Telephone Encounter (Signed)
DR. Alvester Morin, CAN YOU REVIEW HER CT SCAN? REPORT IS IN EPIC. THANKS.

## 2013-07-11 ENCOUNTER — Ambulatory Visit (INDEPENDENT_AMBULATORY_CARE_PROVIDER_SITE_OTHER): Payer: BC Managed Care – PPO | Admitting: Family Medicine

## 2013-07-11 ENCOUNTER — Encounter: Payer: Self-pay | Admitting: Family Medicine

## 2013-07-11 ENCOUNTER — Ambulatory Visit (INDEPENDENT_AMBULATORY_CARE_PROVIDER_SITE_OTHER): Payer: BC Managed Care – PPO

## 2013-07-11 VITALS — BP 127/79 | HR 121 | Temp 98.0°F | Ht 63.5 in | Wt 149.0 lb

## 2013-07-11 DIAGNOSIS — R109 Unspecified abdominal pain: Secondary | ICD-10-CM

## 2013-07-11 LAB — POCT CBC
Granulocyte percent: 80.1 %G — AB (ref 37–80)
HCT, POC: 37.5 % — AB (ref 37.7–47.9)
Hemoglobin: 13.7 g/dL (ref 12.2–16.2)
Lymph, poc: 2 (ref 0.6–3.4)
MCH, POC: 31.6 pg — AB (ref 27–31.2)
MCHC: 36.5 g/dL — AB (ref 31.8–35.4)
MCV: 86.7 fL (ref 80–97)
MPV: 8.2 fL (ref 0–99.8)
POC Granulocyte: 8.4 — AB (ref 2–6.9)
POC LYMPH PERCENT: 18.6 %L (ref 10–50)
Platelet Count, POC: 204 10*3/uL (ref 142–424)
RBC: 4.3 M/uL (ref 4.04–5.48)
RDW, POC: 14.6 %
WBC: 10.5 10*3/uL — AB (ref 4.6–10.2)

## 2013-07-11 MED ORDER — HYDROCODONE-ACETAMINOPHEN 10-325 MG PO TABS: 1.0000 | ORAL_TABLET | Freq: Three times a day (TID) | ORAL | Status: DC | PRN

## 2013-07-11 MED ORDER — KETOROLAC TROMETHAMINE 60 MG/2ML IM SOLN
60.0000 mg | Freq: Once | INTRAMUSCULAR | Status: AC
  Administered 2013-07-11: 60 mg via INTRAMUSCULAR

## 2013-07-11 MED ORDER — MELOXICAM 15 MG PO TABS: 15.0000 mg | ORAL_TABLET | Freq: Every day | ORAL | Status: DC

## 2013-07-11 NOTE — Progress Notes (Signed)
  Subjective:    Patient ID: Gabrielle Gallegos, female    DOB: March 16, 1956, 57 y.o.   MRN: [id number]  HPI Patient presents today for followup of right-sided abdominal pain. Patient seen on October 21 for symptoms. Had predominant right-sided abdominal pain that was worse with coughing as well as lying on the affected side. A CT scan of the abdomen and pelvis was done at that time to rule out intra-abdominal process. There was a fatty liver changes, small bilateral nonobstructing kidney stones, renal angiomyolipoma, no shortness of process. Patient did have a CBC with a mild white count of 11.8. Patient has had no fevers. A urinalysis was also within normal limits. The patient presents today stating that the pain has been persistent at home. Patient states that pain is greater than 10 out of 10. It is significantly worse with coughing as well as lying on the affected side. No dysuria or diarrhea. Patient is a baseline smoker. Pain is significantly worse with coughing. No shortness of breath.   Review of Systems  All other systems reviewed and are negative.       Objective:   Physical Exam  Constitutional: She appears well-developed and well-nourished.  HENT:  Head: Normocephalic and atraumatic.  Eyes: EOM are normal. Pupils are equal, round, and reactive to light.  Neck: Normal range of motion. Neck supple.  Cardiovascular: Normal rate and regular rhythm.   Abdominal: Soft. Bowel sounds are normal.    + marked TTP over affected area  Most predominant over R lower lateral ribs.   Musculoskeletal: Normal range of motion.  Neurological: She is alert.  Skin: Skin is warm.   [facility] reading (PRIMARY) by  Dr. Alvester Morin Preliminary read of rib x-ray negative for any acute fracture or dislocation.                                         Assessment & Plan:  Right sided abdominal pain - Plan: DG Ribs Unilateral W/Chest Right, POCT CBC, meloxicam (MOBIC) 15 MG tablet, HYDROcodone-acetaminophen  (NORCO) 10-325 MG per tablet, Ambulatory referral to Physical Therapy, CT Chest Wo Contrast, ketorolac (TORADOL) injection 60 mg  Overall symptomatology seems highly consistent with costochondritis/abdominal wall strain. Suspect that chronic coughing from recurrent smoking is likely exacerbating symptoms. Rib films are negative today. However, given duration and intensity of symptoms I think a CT scan will be more beneficial, especially given smoking history. Noted leukocytosis from recent visit is trending down today. Afebrile. No shortness of breath. Patient given Toradol 30 mg IM x1. We'll place a course of Mobic as well as when necessary Vicodin for pain. Followup pending CT scan. Noted chronic findings on recent CT scan of the abdomen. I do not suspect these are contributing to patient's symptoms is pain is fairly focal and reproducible with palpation as well as movement. Had a fairly lengthy discussion with patient. She is agreeable to plan. Followup in ER if symptoms persist despite treatment.

## 2013-07-11 NOTE — Patient Instructions (Signed)
Ketorolac injection What is this medicine? KETOROLAC (kee toe ROLE ak) is a non-steroidal anti-inflammatory drug (NSAID). It is used to treat moderate to severe pain for up to 5 days. It is commonly used after surgery. This medicine should not be used for more than 5 days. This medicine may be used for other purposes; ask your health care provider or pharmacist if you have questions. What should I tell my health care provider before I take this medicine? They need to know if you have any of these conditions: -asthma, especially aspirin-sensitive asthma -bleeding problems -kidney disease -stomach bleed, ulcer, or other problem -taking aspirin, other NSAID, or probenecid -an unusual or allergic reaction to ketorolac, tromethamine, aspirin, other NSAIDs, other medicines, foods, dyes or preservatives -pregnant or trying to get pregnant -breast-feeding How should I use this medicine? This medicine is for injection into a muscle or into a vein. It is given by a health care professional in a hospital or clinic setting. Talk to your pediatrician regarding the use of this medicine in children. While this drug may be prescribed for children as young as 2 years old for selected conditions, precautions do apply. Patients over 56 years old may have a stronger reaction and need a smaller dose. Overdosage: If you think you have taken too much of this medicine contact a poison control center or emergency room at once. NOTE: This medicine is only for you. Do not share this medicine with others. What if I miss a dose? This does not apply. What may interact with this medicine? Do not take this medicine with any of the following medications: -aspirin and aspirin-like medicines -cidofovir -methotrexate -NSAIDs, medicines for pain and inflammation, like ibuprofen or naproxen -pentoxifylline -probenecid This medicine may also interact with the following  medications: -alcohol -alendronate -alprazolam -carbamazepine -diuretics -flavocoxid -fluoxetine -ginkgo -lithium -medicines for blood pressure like enalapril -medicines that affect platelets like pentoxifylline -medicines that treat or prevent blood clots like heparin, warfarin -muscle relaxants -pemetrexed -phenytoin -thiothixene This list may not describe all possible interactions. Give your health care provider a list of all the medicines, herbs, non-prescription drugs, or dietary supplements you use. Also tell them if you smoke, drink alcohol, or use illegal drugs. Some items may interact with your medicine. What should I watch for while using this medicine? Tell your doctor or healthcare professional if your symptoms do not start to get better or if they get worse. This medicine does not prevent heart attack or stroke. In fact, this medicine may increase the chance of a heart attack or stroke. The chance may increase with longer use of this medicine and in people who have heart disease. If you take aspirin to prevent heart attack or stroke, talk with your doctor or health care professional. Do not take medicines such as ibuprofen and naproxen with this medicine. Side effects such as stomach upset, nausea, or ulcers may be more likely to occur. Many medicines available without a prescription should not be taken with this medicine. This medicine can cause ulcers and bleeding in the stomach and intestines at any time during treatment. Do not smoke cigarettes or drink alcohol. These increase irritation to your stomach and can make it more susceptible to damage from this medicine. Ulcers and bleeding can happen without warning symptoms and can cause death. This medicine can cause you to bleed more easily. Try to avoid damage to your teeth and gums when you brush or floss your teeth. What side effects may I notice from receiving   this medicine? Side effects that you should report to your  doctor or health care professional as soon as possible: -allergic reactions like skin rash, itching or hives, swelling of the face, lips, or tongue -black or tarry stools -breathing problems -changes in vision -chest pain -high blood pressure -nausea, vomiting -redness, blistering, peeling or loosening of the skin, including inside the mouth -severe abdominal pain -slurred speech or weakness on one side of the body -trouble passing urine or change in the amount of urine -unexplained weight gain or swelling -unusual bleeding or bruising -unusually weak or tired -yellowing of eyes or skin Side effects that usually do not require medical attention (report to your doctor or health care professional if they continue or are bothersome): -diarrhea -dizziness -headache -heartburn This list may not describe all possible side effects. Call your doctor for medical advice about side effects. You may report side effects to FDA at 1-800-FDA-1088. Where should I keep my medicine? This drug is given in a hospital or clinic and will not be stored at home. NOTE: This sheet is a summary. It may not cover all possible information. If you have questions about this medicine, talk to your doctor, pharmacist, or health care provider.  2012, [employer]vier/Gold Standard. (01/12/2008 5:24:50 PM)

## 2013-07-18 ENCOUNTER — Telehealth: Payer: Self-pay | Admitting: Nurse Practitioner

## 2013-07-18 NOTE — Telephone Encounter (Signed)
ok 

## 2013-07-18 NOTE — Telephone Encounter (Signed)
No vicodin refill- just had filled on 07/11/13

## 2013-07-18 NOTE — Telephone Encounter (Signed)
Imaging discussed at last clinical visit.

## 2013-07-18 NOTE — Telephone Encounter (Signed)
Patient aware she was on 5 but [facility] put her on 10 she will just wait and go back on the 5 in december

## 2013-07-25 ENCOUNTER — Other Ambulatory Visit (HOSPITAL_COMMUNITY): Payer: BC Managed Care – PPO

## 2013-08-09 ENCOUNTER — Telehealth: Payer: Self-pay | Admitting: Nurse Practitioner

## 2013-08-09 DIAGNOSIS — R109 Unspecified abdominal pain: Secondary | ICD-10-CM

## 2013-08-09 MED ORDER — HYDROCODONE-ACETAMINOPHEN 10-325 MG PO TABS: 1.0000 | ORAL_TABLET | Freq: Three times a day (TID) | ORAL | Status: DC | PRN

## 2013-08-09 NOTE — Telephone Encounter (Signed)
rx ready for pickup 

## 2013-08-09 NOTE — Telephone Encounter (Signed)
PT NOTIFIED  

## 2013-09-14 ENCOUNTER — Telehealth: Payer: Self-pay | Admitting: Nurse Practitioner

## 2013-09-14 DIAGNOSIS — R109 Unspecified abdominal pain: Secondary | ICD-10-CM

## 2013-09-14 MED ORDER — MELOXICAM 15 MG PO TABS: 15.0000 mg | ORAL_TABLET | Freq: Every day | ORAL | Status: DC

## 2013-09-14 MED ORDER — HYDROCODONE-ACETAMINOPHEN 10-325 MG PO TABS: 1.0000 | ORAL_TABLET | Freq: Three times a day (TID) | ORAL | Status: DC | PRN

## 2013-09-14 NOTE — Telephone Encounter (Signed)
rx ready for pickup 

## 2013-09-15 NOTE — Telephone Encounter (Signed)
DO YOU WANT HER TO TAKE MELOXICAM REGULARLY?

## 2013-09-15 NOTE — Telephone Encounter (Signed)
PT NOTIFIED AND VERBALIZED UNDERSTANDING.

## 2013-09-15 NOTE — Telephone Encounter (Signed)
She can if it helps

## 2013-10-20 ENCOUNTER — Telehealth: Payer: Self-pay | Admitting: Family Medicine

## 2013-10-20 NOTE — Telephone Encounter (Signed)
No Vicodin refill. This was given for an acute problem 3 months ago. She went to followup with her regular doctor for this.

## 2013-10-22 MED ORDER — HYDROCODONE-ACETAMINOPHEN 5-325 MG PO TABS: 1.0000 | ORAL_TABLET | Freq: Two times a day (BID) | ORAL | Status: DC

## 2013-10-22 NOTE — Telephone Encounter (Signed)
rx ready for pickup 

## 2013-10-23 NOTE — Telephone Encounter (Signed)
Hydrocodone rx ready the patient aware.

## 2013-11-17 ENCOUNTER — Telehealth: Payer: Self-pay | Admitting: Nurse Practitioner

## 2013-11-17 DIAGNOSIS — R109 Unspecified abdominal pain: Secondary | ICD-10-CM

## 2013-11-17 MED ORDER — HYDROCODONE-ACETAMINOPHEN 5-325 MG PO TABS: 1.0000 | ORAL_TABLET | Freq: Two times a day (BID) | ORAL | Status: DC

## 2013-11-17 MED ORDER — MELOXICAM 15 MG PO TABS: 15.0000 mg | ORAL_TABLET | Freq: Every day | ORAL | Status: DC

## 2013-11-17 NOTE — Telephone Encounter (Signed)
rx ready for pick up - if continues to take pain meds will need to refer to pain management

## 2013-11-18 NOTE — Telephone Encounter (Signed)
Patient aware.

## 2013-12-06 ENCOUNTER — Telehealth: Payer: Self-pay | Admitting: Nurse Practitioner

## 2013-12-06 NOTE — Telephone Encounter (Signed)
appt tomorrow

## 2013-12-07 ENCOUNTER — Encounter: Payer: Self-pay | Admitting: Family Medicine

## 2013-12-07 ENCOUNTER — Ambulatory Visit (INDEPENDENT_AMBULATORY_CARE_PROVIDER_SITE_OTHER): Payer: BC Managed Care – PPO | Admitting: Family Medicine

## 2013-12-07 ENCOUNTER — Ambulatory Visit (INDEPENDENT_AMBULATORY_CARE_PROVIDER_SITE_OTHER): Payer: BC Managed Care – PPO

## 2013-12-07 VITALS — BP 126/79 | HR 94 | Temp 97.5°F | Ht 63.5 in | Wt 138.4 lb

## 2013-12-07 DIAGNOSIS — M545 Low back pain, unspecified: Secondary | ICD-10-CM

## 2013-12-07 MED ORDER — METHYLPREDNISOLONE (PAK) 4 MG PO TABS: ORAL_TABLET | ORAL | Status: DC

## 2013-12-07 MED ORDER — KETOROLAC TROMETHAMINE 60 MG/2ML IM SOLN
30.0000 mg | Freq: Once | INTRAMUSCULAR | Status: AC
  Administered 2013-12-07: 30 mg via INTRAMUSCULAR

## 2013-12-07 MED ORDER — KETOROLAC TROMETHAMINE 30 MG/ML IJ SOLN: 30.0000 mg | Freq: Once | INTRAMUSCULAR | Status: AC

## 2013-12-07 MED ORDER — KETOROLAC TROMETHAMINE 60 MG/2ML IM SOLN: 60.0000 mg | Freq: Once | INTRAMUSCULAR | Status: AC

## 2013-12-07 MED ORDER — CYCLOBENZAPRINE HCL 10 MG PO TABS: 10.0000 mg | ORAL_TABLET | Freq: Three times a day (TID) | ORAL | Status: DC | PRN

## 2013-12-07 NOTE — Addendum Note (Signed)
Addended by: Marin Olp on: 12/07/2013 05:24 PM   Modules accepted: Orders

## 2013-12-07 NOTE — Progress Notes (Signed)
   Subjective:    Patient ID: Gabrielle Gallegos, female    DOB: 01-28-1956, 58 y.o.   MRN: [id number]  HPI   This 58 y.o. female presents for evaluation of back pain.  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  She states vicodin is not helping and she has not slept in 3 days. Review of Systems C/o back pain   No chest pain, SOB, HA, dizziness, vision change, N/V, diarrhea, constipation, dysuria, urinary urgency or frequency, myalgias, arthralgias or rash.  Objective:   Physical Exam  Vital signs noted  Well developed well nourished female.  HEENT - Head atraumatic Normocephalic                Eyes - PERRLA, Conjuctiva - clear Sclera- Clear EOMI                Ears - EAC's Wnl TM's Wnl Gross Hearing WNL                Throat - oropharanx wLn Respiratory - Lungs CTA bilateral Cardiac - RRR S1 and S2 without murmur Neuro - Grossly intact. MS - TTP LS muscles and decreased ROM LS spine  Xray of LS spine - scoliosis, no fracture seen Prelimnary reading by Gwyndolyn Saxon Rainn Zupko,FNP    Assessment & Plan:  LBP (low back pain) - Plan: DG Lumbar Spine 2-3 Views, methylPREDNIsolone (MEDROL DOSPACK) 4 MG tablet, cyclobenzaprine (FLEXERIL) 10 MG tablet, ketorolac (TORADOL) 30 MG/ML injection 30 mg  Gabrielle Penner FNP

## 2013-12-12 ENCOUNTER — Telehealth: Payer: Self-pay | Admitting: Family Medicine

## 2013-12-14 ENCOUNTER — Telehealth: Payer: Self-pay | Admitting: Family Medicine

## 2013-12-14 NOTE — Telephone Encounter (Signed)
Follow up.

## 2013-12-14 NOTE — Telephone Encounter (Signed)
Appt scheduled for monday

## 2013-12-14 NOTE — Telephone Encounter (Signed)
Spoke with pt and appt scheduled with [employer]

## 2013-12-18 ENCOUNTER — Encounter: Payer: Self-pay | Admitting: Family Medicine

## 2013-12-18 ENCOUNTER — Ambulatory Visit (INDEPENDENT_AMBULATORY_CARE_PROVIDER_SITE_OTHER): Payer: BC Managed Care – PPO | Admitting: Family Medicine

## 2013-12-18 VITALS — BP 123/71 | HR 125 | Temp 97.5°F | Ht 63.5 in | Wt 140.0 lb

## 2013-12-18 DIAGNOSIS — M549 Dorsalgia, unspecified: Secondary | ICD-10-CM

## 2013-12-18 MED ORDER — HYDROCODONE-ACETAMINOPHEN 5-325 MG PO TABS: 1.0000 | ORAL_TABLET | Freq: Two times a day (BID) | ORAL | Status: DC

## 2013-12-18 NOTE — Progress Notes (Signed)
   Subjective:    Patient ID: Gabrielle Gallegos, female    DOB: April 19, 1956, 58 y.o.   MRN: [id number]  HPI  This 58 y.o. female presents for evaluation of persistent back pain.  Review of Systems No chest pain, SOB, HA, dizziness, vision change, N/V, diarrhea, constipation, dysuria, urinary urgency or frequency, myalgias, arthralgias or rash.     Objective:   Physical Exam  Vital signs noted  Well developed well nourished female.  HEENT - Head atraumatic Normocephalic Respiratory - Lungs CTA bilateral Cardiac - RRR S1 and S2 without murmur MS - TTP lumbar paraspionous muscles    Assessment & Plan:  Back pain - Plan: HYDROcodone-acetaminophen (NORCO/VICODIN) 5-325 MG per tablet Heating pad and follow up prn  Lysbeth Penner FNP

## 2014-01-09 ENCOUNTER — Telehealth: Payer: Self-pay | Admitting: Nurse Practitioner

## 2014-01-09 ENCOUNTER — Telehealth: Payer: Self-pay | Admitting: Family Medicine

## 2014-01-09 NOTE — Telephone Encounter (Signed)
Cannot fill control medicine early- will have to wait until time to refill

## 2014-01-10 NOTE — Telephone Encounter (Signed)
Can we write a letter for her?

## 2014-01-10 NOTE — Telephone Encounter (Signed)
Patient notified and is ok with this.

## 2014-01-11 NOTE — Telephone Encounter (Signed)
Please call patient and see why she needs this?

## 2014-01-12 NOTE — Telephone Encounter (Signed)
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

## 2014-01-22 ENCOUNTER — Telehealth: Payer: Self-pay | Admitting: Nurse Practitioner

## 2014-01-22 DIAGNOSIS — M549 Dorsalgia, unspecified: Secondary | ICD-10-CM

## 2014-01-22 MED ORDER — HYDROCODONE-ACETAMINOPHEN 5-325 MG PO TABS: 1.0000 | ORAL_TABLET | Freq: Two times a day (BID) | ORAL | Status: DC

## 2014-01-22 NOTE — Telephone Encounter (Signed)
Patient aware to pick up 

## 2014-01-22 NOTE — Telephone Encounter (Signed)
rx ready for pickup 

## 2014-02-16 ENCOUNTER — Ambulatory Visit: Payer: BC Managed Care – PPO | Admitting: Family Medicine

## 2014-02-22 ENCOUNTER — Encounter: Payer: Self-pay | Admitting: Nurse Practitioner

## 2014-02-22 ENCOUNTER — Telehealth: Payer: Self-pay | Admitting: Nurse Practitioner

## 2014-02-22 ENCOUNTER — Ambulatory Visit: Payer: BC Managed Care – PPO | Admitting: Physician Assistant

## 2014-02-22 ENCOUNTER — Ambulatory Visit (INDEPENDENT_AMBULATORY_CARE_PROVIDER_SITE_OTHER): Payer: BC Managed Care – PPO | Admitting: Nurse Practitioner

## 2014-02-22 VITALS — BP 132/80 | HR 122 | Temp 98.6°F | Ht 63.5 in | Wt 145.6 lb

## 2014-02-22 DIAGNOSIS — M545 Low back pain, unspecified: Secondary | ICD-10-CM

## 2014-02-22 DIAGNOSIS — G8929 Other chronic pain: Secondary | ICD-10-CM

## 2014-02-22 DIAGNOSIS — M62838 Other muscle spasm: Secondary | ICD-10-CM

## 2014-02-22 DIAGNOSIS — R000 Tachycardia, unspecified: Secondary | ICD-10-CM

## 2014-02-22 DIAGNOSIS — M4120 Other idiopathic scoliosis, site unspecified: Secondary | ICD-10-CM | POA: Insufficient documentation

## 2014-02-22 DIAGNOSIS — M549 Dorsalgia, unspecified: Secondary | ICD-10-CM

## 2014-02-22 MED ORDER — HYDROCODONE-ACETAMINOPHEN 5-325 MG PO TABS: 1.0000 | ORAL_TABLET | Freq: Two times a day (BID) | ORAL | Status: DC

## 2014-02-22 MED ORDER — METOPROLOL SUCCINATE ER 50 MG PO TB24: 50.0000 mg | ORAL_TABLET | Freq: Every day | ORAL | Status: DC

## 2014-02-22 MED ORDER — CYCLOBENZAPRINE HCL 5 MG PO TABS: 5.0000 mg | ORAL_TABLET | Freq: Three times a day (TID) | ORAL | Status: DC | PRN

## 2014-02-22 NOTE — Progress Notes (Signed)
   Subjective:    Patient ID: Gabrielle Gallegos, female    DOB: Dec 16, 1955, 58 y.o.   MRN: [id number]  HPI  Patient is here today due to muscle spasm in her left arm. The problem started about 3 weeks ago. Denies any numbness or tingling in the hand. Took tylenol, aleve provide no relieve but heating pad provide the most relieve. Patient is on Norco.  Pressure make it worse.      Review of Systems  Constitutional: Negative.   HENT: Negative.   Eyes: Negative.   Respiratory: Negative.   Cardiovascular: Negative.   Gastrointestinal: Negative.   Neurological: Negative.        Objective:   Physical Exam  Constitutional: She is oriented to person, place, and time. She appears well-developed and well-nourished.  HENT:  Head: Normocephalic and atraumatic.  Eyes: Pupils are equal, round, and reactive to light.  Neck: Normal range of motion.  Cardiovascular: Normal rate and regular rhythm.   Pulmonary/Chest: Effort normal.  Abdominal: Soft.  Musculoskeletal: She exhibits no edema and no tenderness.  Pain with  Hyperextension.  Neurological: She is alert and oriented to person, place, and time.  Skin: Skin is warm and dry.    BP 132/80  Pulse 122  Temp(Src) 98.6 F (37 C) (Oral)  Ht 5' 3.5" (1.613 m)  Wt 145 lb 9.6 oz (66.044 kg)  BMI 25.38 kg/m2  EKG-sinus [place] Abbey, FNP      Assessment & Plan:  1. Muscle spasm Moist heat assage if helps - cyclobenzaprine (FLEXERIL) 5 MG tablet; Take 1 tablet (5 mg total) by mouth 3 (three) times daily as needed for muscle spasms.  Dispense: 30 tablet; Refill: 1  2. Midline low back pain without sciatica Need referral to pain clinic - HYDROcodone-acetaminophen (NORCO/VICODIN) 5-325 MG per tablet; Take 1 tablet by mouth 2 (two) times daily.  Dispense: 60 tablet; Refill: 0  3. Chronic back pain -referral to pain management  4. Tachycardia Referral to cardiology Metoprolol 50 XL- 1 po Qd #30 5  refills  Mary-Margaret Hassell Done, FNP

## 2014-02-22 NOTE — Patient Instructions (Addendum)
Nonspecific Tachycardia  Tachycardia is a faster than normal heartbeat (more than 100 beats per minute). In adults, the heart normally beats between 60 and 100 times a minute. A fast heartbeat may be a normal response to exercise or stress. It does not necessarily mean that something is wrong. However, sometimes when your heart beats too fast it may not be able to pump enough blood to the rest of your body. This can result in chest pain, shortness of breath, dizziness, and even fainting. Nonspecific tachycardia means that the specific cause or pattern of your tachycardia is unknown.  CAUSES   Tachycardia may be harmless or it may be due to a more serious underlying cause. Possible causes of tachycardia include:  · Exercise or exertion.  · Fever.  · Pain or injury.  · Infection.  · Loss of body fluids (dehydration).  · Overactive thyroid.  · Lack of red blood cells (anemia).  · Anxiety and stress.  · Alcohol.  · Caffeine.  · Tobacco products.  · Diet pills.  · Illegal drugs.  · Heart disease.  SYMPTOMS  · Rapid or irregular heartbeat (palpitations).  · Suddenly feeling your heart beating (cardiac awareness).  · Dizziness.  · Tiredness (fatigue).  · Shortness of breath.  · Chest pain.  · Nausea.  · Fainting.  DIAGNOSIS   Your caregiver will perform a physical exam and take your medical history. In some cases, a heart specialist (cardiologist) may be consulted. Your caregiver may also order:  · Blood tests.  · Electrocardiography. This test records the electrical activity of your heart.  · A heart monitoring test.  TREATMENT   Treatment will depend on the likely cause of your tachycardia. The goal is to treat the underlying cause of your tachycardia. Treatment methods may include:  · Replacement of fluids or blood through an intravenous (IV) tube for moderate to severe dehydration or anemia.  · New medicines or changes in your current medicines.  · Diet and lifestyle changes.  · Treatment for certain  infections.  · Stress relief or relaxation methods.  HOME CARE INSTRUCTIONS   · Rest.  · Drink enough fluids to keep your urine clear or pale yellow.  · Do not smoke.  · Avoid:  ¨ Caffeine.  ¨ Tobacco.  ¨ Alcohol.  ¨ Chocolate.  ¨ Stimulants such as over-the-counter diet pills or pills that help you stay awake.  ¨ Situations that cause anxiety or stress.  ¨ Illegal drugs such as marijuana, phencyclidine (PCP), and cocaine.  · Only take medicine as directed by your caregiver.  · Keep all follow-up appointments as directed by your caregiver.  SEEK IMMEDIATE MEDICAL CARE IF:   · You have pain in your chest, upper arms, jaw, or neck.  · You become weak, dizzy, or feel faint.  · You have palpitations that will not go away.  · You vomit, have diarrhea, or pass blood in your stool.  · Your skin is cool, pale, and wet.  · You have a fever that will not go away with rest, fluids, and medicine.  MAKE SURE YOU:   · Understand these instructions.  · Will watch your condition.  · Will get help right away if you are not doing well or get worse.  Document Released: 10/01/2004 Document Revised: 11/16/2011 Document Reviewed: 08/04/2011  ExitCare® Patient Information ©2015 [employer]. This information is not intended to replace advice given to you by your health care Cobain Morici. Make sure you discuss any questions   you have with your health care Shawntay Prest.

## 2014-03-01 ENCOUNTER — Ambulatory Visit: Payer: BC Managed Care – PPO | Admitting: Physician Assistant

## 2014-03-23 ENCOUNTER — Other Ambulatory Visit: Payer: Self-pay | Admitting: Nurse Practitioner

## 2014-03-28 ENCOUNTER — Encounter: Payer: Self-pay | Admitting: Physical Medicine & Rehabilitation

## 2014-04-04 ENCOUNTER — Telehealth: Payer: Self-pay | Admitting: Family Medicine

## 2014-04-04 DIAGNOSIS — M545 Low back pain, unspecified: Secondary | ICD-10-CM

## 2014-04-04 MED ORDER — HYDROCODONE-ACETAMINOPHEN 5-325 MG PO TABS: 1.0000 | ORAL_TABLET | Freq: Two times a day (BID) | ORAL | Status: DC

## 2014-04-04 NOTE — Telephone Encounter (Signed)
rx ready for pickup 

## 2014-04-04 NOTE — Telephone Encounter (Signed)
PATIENT AWARE

## 2014-04-17 ENCOUNTER — Institutional Professional Consult (permissible substitution): Payer: BC Managed Care – PPO | Admitting: Cardiology

## 2014-04-19 ENCOUNTER — Telehealth: Payer: Self-pay | Admitting: Nurse Practitioner

## 2014-04-19 MED ORDER — GABAPENTIN 300 MG PO CAPS: 300.0000 mg | ORAL_CAPSULE | Freq: Every day | ORAL | Status: DC

## 2014-04-19 NOTE — Telephone Encounter (Signed)
I sent the Rx to the pharmacy.

## 2014-05-02 ENCOUNTER — Other Ambulatory Visit: Payer: Self-pay | Admitting: Nurse Practitioner

## 2014-05-03 ENCOUNTER — Encounter: Payer: Self-pay | Admitting: Nurse Practitioner

## 2014-05-03 ENCOUNTER — Ambulatory Visit (INDEPENDENT_AMBULATORY_CARE_PROVIDER_SITE_OTHER): Payer: BC Managed Care – PPO | Admitting: Nurse Practitioner

## 2014-05-03 VITALS — BP 147/81 | HR 134 | Temp 97.9°F | Ht 63.0 in | Wt 147.0 lb

## 2014-05-03 DIAGNOSIS — M62838 Other muscle spasm: Secondary | ICD-10-CM

## 2014-05-03 DIAGNOSIS — G589 Mononeuropathy, unspecified: Secondary | ICD-10-CM

## 2014-05-03 DIAGNOSIS — G629 Polyneuropathy, unspecified: Secondary | ICD-10-CM

## 2014-05-03 DIAGNOSIS — R000 Tachycardia, unspecified: Secondary | ICD-10-CM

## 2014-05-03 MED ORDER — DILTIAZEM HCL 60 MG PO TABS: 60.0000 mg | ORAL_TABLET | Freq: Four times a day (QID) | ORAL | Status: DC

## 2014-05-03 NOTE — Progress Notes (Signed)
   Subjective:    Patient ID: Gabrielle Gallegos, female    DOB: 02/11/1956, 58 y.o.   MRN: [id number]  HPI Patine tin today with several complaints: *Bilateral leg and feet tingling x 3 years.  Legs are restless at night and has been on Gabapentin since Aug 2015 with an increase in dosage and no relief.Went to urgentt care Monday and neurotin was increased to $RemoveBefo'300mg'lWqJjNpDkwK$  TID Not able to sleep due to this.   * Gets tired and legs weak when walking. Having lots of muscle spasm in arms and legs * Still having an increased heart rate and stopped taking Toprol last week because the medicine was causing vomiting.      Review of Systems  Constitutional: Negative.   Respiratory: Negative.   Cardiovascular: Negative.  Negative for chest pain and palpitations.  Skin: Negative.   Neurological: Negative for weakness.       Bilateral lower leg feet burning and tingling  Psychiatric/Behavioral: Negative.        Objective:   Physical Exam  Constitutional: She is oriented to person, place, and time. She appears well-developed and well-nourished.  Cardiovascular: Regular rhythm and normal heart sounds.  Tachycardia present.   Pulmonary/Chest: Breath sounds normal.  Neurological: She is alert and oriented to person, place, and time.  Skin:  Monofilament 4/4 bilateral lower legs and feet   Dry flaky skin bilateral feet  Psychiatric: She has a normal mood and affect. Her behavior is normal. Judgment and thought content normal.    BP 147/81  Pulse 134  Temp(Src) 97.9 F (36.6 C) (Oral)  Ht $R'5\' 3"'Yq$  (1.6 m)  Wt 147 lb (66.679 kg)  BMI 26.05 kg/m2       Assessment & Plan:  1. Neuropathy Continue current dose of neurotin   2. Muscle spasms of both lower extremities Labs pendig - BMP8+EGFR  3. Tachycardia Will do cardizem until we see what thyroid levels look like - Thyroid Panel With TSH - diltiazem (CARDIZEM) 60 MG tablet; Take 1 tablet (60 mg total) by mouth 4 (four) times daily.  Dispense: 30  tablet; Refill: 0   Mary-Margaret Hassell Done, FNP

## 2014-05-03 NOTE — Patient Instructions (Signed)
Nonspecific Tachycardia  Tachycardia is a faster than normal heartbeat (more than 100 beats per minute). In adults, the heart normally beats between 60 and 100 times a minute. A fast heartbeat may be a normal response to exercise or stress. It does not necessarily mean that something is wrong. However, sometimes when your heart beats too fast it may not be able to pump enough blood to the rest of your body. This can result in chest pain, shortness of breath, dizziness, and even fainting. Nonspecific tachycardia means that the specific cause or pattern of your tachycardia is unknown.  CAUSES   Tachycardia may be harmless or it may be due to a more serious underlying cause. Possible causes of tachycardia include:  · Exercise or exertion.  · Fever.  · Pain or injury.  · Infection.  · Loss of body fluids (dehydration).  · Overactive thyroid.  · Lack of red blood cells (anemia).  · Anxiety and stress.  · Alcohol.  · Caffeine.  · Tobacco products.  · Diet pills.  · Illegal drugs.  · Heart disease.  SYMPTOMS  · Rapid or irregular heartbeat (palpitations).  · Suddenly feeling your heart beating (cardiac awareness).  · Dizziness.  · Tiredness (fatigue).  · Shortness of breath.  · Chest pain.  · Nausea.  · Fainting.  DIAGNOSIS   Your caregiver will perform a physical exam and take your medical history. In some cases, a heart specialist (cardiologist) may be consulted. Your caregiver may also order:  · Blood tests.  · Electrocardiography. This test records the electrical activity of your heart.  · A heart monitoring test.  TREATMENT   Treatment will depend on the likely cause of your tachycardia. The goal is to treat the underlying cause of your tachycardia. Treatment methods may include:  · Replacement of fluids or blood through an intravenous (IV) tube for moderate to severe dehydration or anemia.  · New medicines or changes in your current medicines.  · Diet and lifestyle changes.  · Treatment for certain  infections.  · Stress relief or relaxation methods.  HOME CARE INSTRUCTIONS   · Rest.  · Drink enough fluids to keep your urine clear or pale yellow.  · Do not smoke.  · Avoid:  ¨ Caffeine.  ¨ Tobacco.  ¨ Alcohol.  ¨ Chocolate.  ¨ Stimulants such as over-the-counter diet pills or pills that help you stay awake.  ¨ Situations that cause anxiety or stress.  ¨ Illegal drugs such as marijuana, phencyclidine (PCP), and cocaine.  · Only take medicine as directed by your caregiver.  · Keep all follow-up appointments as directed by your caregiver.  SEEK IMMEDIATE MEDICAL CARE IF:   · You have pain in your chest, upper arms, jaw, or neck.  · You become weak, dizzy, or feel faint.  · You have palpitations that will not go away.  · You vomit, have diarrhea, or pass blood in your stool.  · Your skin is cool, pale, and wet.  · You have a fever that will not go away with rest, fluids, and medicine.  MAKE SURE YOU:   · Understand these instructions.  · Will watch your condition.  · Will get help right away if you are not doing well or get worse.  Document Released: 10/01/2004 Document Revised: 11/16/2011 Document Reviewed: 08/04/2011  ExitCare® Patient Information ©2015 [employer]. This information is not intended to replace advice given to you by your health care provider. Make sure you discuss any questions   you have with your health care provider.

## 2014-05-04 LAB — BMP8+EGFR
BUN/Creatinine Ratio: 33 — ABNORMAL HIGH (ref 9–23)
BUN: 18 mg/dL (ref 6–24)
CO2: 23 mmol/L (ref 18–29)
Calcium: 9.6 mg/dL (ref 8.7–10.2)
Chloride: 93 mmol/L — ABNORMAL LOW (ref 97–108)
Creatinine, Ser: 0.55 mg/dL — ABNORMAL LOW (ref 0.57–1.00)
GFR calc Af Amer: 120 mL/min/{1.73_m2} (ref >59)
GFR calc non Af Amer: 104 mL/min/{1.73_m2} (ref >59)
Glucose: 112 mg/dL — ABNORMAL HIGH (ref 65–99)
Potassium: 4.1 mmol/L (ref 3.5–5.2)
Sodium: 137 mmol/L (ref 134–144)

## 2014-05-04 LAB — THYROID PANEL WITH TSH
Free Thyroxine Index: 2.1 (ref 1.2–4.9)
T3 Uptake Ratio: 22 % — ABNORMAL LOW (ref 24–39)
T4, Total: 9.4 ug/dL (ref 4.5–12.0)
TSH: 0.509 u[IU]/mL (ref 0.450–4.500)

## 2014-05-07 ENCOUNTER — Telehealth: Payer: Self-pay | Admitting: Family Medicine

## 2014-05-07 NOTE — Telephone Encounter (Signed)
Saturday she had a bad anxiety attack and having bad sob... She was fine after that. And now today she is dizzy. She was wondering if the diltizem and gabapentin could do that to her.

## 2014-05-07 NOTE — Telephone Encounter (Signed)
Could possibly- lets juts see how you do the next couple f days.

## 2014-05-07 NOTE — Telephone Encounter (Signed)
Patient aware.

## 2014-05-15 ENCOUNTER — Other Ambulatory Visit: Payer: Self-pay | Admitting: Nurse Practitioner

## 2014-05-15 NOTE — Telephone Encounter (Signed)
Last ov 05/03/14. Review note on last visit to see if you want to continue Cardizem. Will need a bigger quantity if you want to continue.

## 2014-05-18 ENCOUNTER — Ambulatory Visit: Payer: BC Managed Care – PPO | Admitting: Physical Medicine & Rehabilitation

## 2014-05-18 ENCOUNTER — Encounter: Payer: Self-pay | Admitting: Cardiology

## 2014-05-18 ENCOUNTER — Ambulatory Visit (INDEPENDENT_AMBULATORY_CARE_PROVIDER_SITE_OTHER): Payer: BC Managed Care – PPO | Admitting: Cardiology

## 2014-05-18 VITALS — BP 138/78 | HR 106 | Ht 64.0 in | Wt 149.8 lb

## 2014-05-18 DIAGNOSIS — I498 Other specified cardiac arrhythmias: Secondary | ICD-10-CM

## 2014-05-18 DIAGNOSIS — G2581 Restless legs syndrome: Secondary | ICD-10-CM

## 2014-05-18 DIAGNOSIS — R000 Tachycardia, unspecified: Secondary | ICD-10-CM

## 2014-05-18 NOTE — Patient Instructions (Addendum)
Your physician recommends that you continue on your current medications as directed. Please refer to the Current Medication list given to you today.  Your physician has requested that you have an echocardiogram. Echocardiography is a painless test that uses sound waves to create images of your heart. It provides your doctor with information about the size and shape of your heart and how well your heart's chambers and valves are working. This procedure takes approximately one hour. There are no restrictions for this procedure.  Your physician has recommended that you wear a holter monitor. Holter monitors are medical devices that record the heart's electrical activity. Doctors most often use these monitors to diagnose arrhythmias. Arrhythmias are problems with the speed or rhythm of the heartbeat. The monitor is a small, portable device. You can wear one while you do your normal daily activities. This is usually used to diagnose what is causing palpitations/syncope (passing out). [place] physician recommends that you schedule a follow-up appointment in: 2 week ov/ekg

## 2014-05-18 NOTE — Progress Notes (Signed)
Gabrielle Gallegos Date of Birth:  12/29/1955 [facility] [address] [place], [place]  [address] [phone]        Fax   [phone]   History of Present Illness: This 58 year old woman is seen at the request of Chevis Pretty FNP for evaluation of rapid heart action.  The patient has a history of persistent sinus tachycardia.  He has a history of previous panic attacks and anxiety attacks.  She has severe restless leg syndrome.  She sleeps poorly at night.  She is under a lot of stress looking after a special needs granddaughter age 70 who has cerebral palsy from anoxia at birth. She does not have any history of diabetes or hypertension.  She has been checked for overactive thyroid with negative results.  She has had a prior trial of metoprolol but had to stop it because it caused nausea and vomiting.  She has not had any chest pain to suggest angina pectoris.  She has had exertional dyspnea.  She had a chest x-ray in October 2014 which showed heart size at the upper limits of normal. Family history reveals her father died of lung cancer at age 28.  Her mother is living at age 61 but has had bypass surgery.  Current Outpatient Prescriptions  Medication Sig Dispense Refill  . diltiazem (CARDIZEM) 60 MG tablet TAKE ONE TABLET BY MOUTH 4 TIMES DAILY  30 tablet  0  . gabapentin (NEURONTIN) 300 MG capsule Take 300 mg by mouth 3 (three) times daily.      . meloxicam (MOBIC) 15 MG tablet TAKE ONE TABLET BY MOUTH ONCE DAILY  30 tablet  0  . cyclobenzaprine (FLEXERIL) 5 MG tablet Take 1 tablet (5 mg total) by mouth 3 (three) times daily as needed for muscle spasms.  30 tablet  1  . HYDROcodone-acetaminophen (NORCO/VICODIN) 5-325 MG per tablet Take 1 tablet by mouth 2 (two) times daily.  60 tablet  0   No current facility-administered medications for this visit.    No Known Allergies  Patient Active Problem List   Diagnosis Date Noted  . Inappropriate sinus  tachycardia 05/18/2014  . Restless leg syndrome 05/18/2014  . Neuropathy 05/03/2014  . Scoliosis (and kyphoscoliosis), idiopathic 02/22/2014  . Chronic back pain 02/22/2014  . Tobacco use disorder 06/19/2013  . Acute bronchitis 06/19/2013  . Cough 06/19/2013  . Depression 01/05/2013  . Migraines 01/05/2013    History  Smoking status  . Former Smoker  . Quit date: 06/20/2013  Smokeless tobacco  . Never Used    History  Alcohol Use No    Family History  Problem Relation Age of Onset  . Heart disease Mother   . Cancer Father     Review of Systems: Constitutional: no fever chills diaphoresis or fatigue or change in weight.  Head and neck: no hearing loss, no epistaxis, no photophobia or visual disturbance. Respiratory: No cough, shortness of breath or wheezing. Cardiovascular: No chest pain peripheral edema, palpitations. Gastrointestinal: No abdominal distention, no abdominal pain, no change in bowel habits hematochezia or melena. Genitourinary: No dysuria, no frequency, no urgency, no nocturia. Musculoskeletal:No arthralgias, no back pain, no gait disturbance or myalgias. Neurological: No dizziness, no headaches, no numbness, no seizures, no syncope, no weakness, no tremors. Hematologic: No lymphadenopathy, no easy bruising. Psychiatric: No confusion, no hallucinations, positive for restless leg syndrome    Physical Exam: Filed Vitals:   05/18/14 1009  BP: 138/78  Pulse: 106  The patient appears to be in no distress.  Head and neck exam reveals that the pupils are equal and reactive.  The extraocular movements are full.  There is no scleral icterus.  Mouth and pharynx are benign.  No lymphadenopathy.  No carotid bruits.  The jugular venous pressure is normal.  Thyroid is not enlarged or tender.  Chest is clear to percussion and auscultation.  No rales or rhonchi.  Expansion of the chest is symmetrical.  Heart reveals no abnormal lift or heave.  First and second  heart sounds are normal.  There is no murmur gallop rub or click.  The abdomen is soft and nontender.  Bowel sounds are normoactive.  There is no hepatosplenomegaly or mass.  There are no abdominal bruits.  Extremities reveal no phlebitis or edema.  Pedal pulses are good.  There is no cyanosis or clubbing.  Neurologic exam is normal strength and no lateralizing weakness.  No sensory deficits.  Integument reveals no rash  EKG to his sinus tachycardia at 106 per minute and is otherwise normal.  Since last tracing of 02/22/14, the heart rate has slowed slightly.  Assessment / Plan: 1. sinus tachycardia of uncertain etiology. 2. restless leg syndrome 3. history of anxiety attacks 4. dyspnea on exertion and previous chest x-ray showing borderline cardiomegaly  Disposition: the physical examination is unremarkable in revealing a cause of her tachycardia.  Recent lab work shows that she is not anemic and her thyroid function is normal.  I suspect that her tachycardia may be secondary to anxiety and stress.  I will obtain a 48 hour Holter monitor to see what her heart rate does at various times during the day.  We will also obtain an echocardiogram. I have asked her to return in 2 weeks for office visit and EKG and to review the above studies.  She did not tolerate Toprol.  We may consider alternative empiric therapy for her tachycardia after we see what the above studies show. Many thanks for the opportunity to see this pleasant woman with you.

## 2014-05-23 ENCOUNTER — Encounter: Payer: Self-pay | Admitting: *Deleted

## 2014-05-23 ENCOUNTER — Encounter (INDEPENDENT_AMBULATORY_CARE_PROVIDER_SITE_OTHER): Payer: BC Managed Care – PPO

## 2014-05-23 ENCOUNTER — Ambulatory Visit (HOSPITAL_COMMUNITY): Payer: BC Managed Care – PPO | Attending: Cardiology | Admitting: Cardiology

## 2014-05-23 DIAGNOSIS — R000 Tachycardia, unspecified: Secondary | ICD-10-CM

## 2014-05-23 DIAGNOSIS — R0602 Shortness of breath: Secondary | ICD-10-CM | POA: Diagnosis present

## 2014-05-23 DIAGNOSIS — Z87891 Personal history of nicotine dependence: Secondary | ICD-10-CM | POA: Insufficient documentation

## 2014-05-23 DIAGNOSIS — I498 Other specified cardiac arrhythmias: Secondary | ICD-10-CM

## 2014-05-23 HISTORY — PX: TRANSTHORACIC ECHOCARDIOGRAM: SHX275

## 2014-05-23 NOTE — Progress Notes (Signed)
Echo performed. 

## 2014-05-23 NOTE — Progress Notes (Signed)
Patient ID: Gabrielle Gallegos, female   DOB: Aug 18, 1956, 58 y.o.   MRN: [id number] E-Cardio 48 hour holter monitor applied to patient.

## 2014-05-26 ENCOUNTER — Telehealth: Payer: Self-pay | Admitting: *Deleted

## 2014-05-26 NOTE — Telephone Encounter (Signed)
Patient called stating that cardizem that she was given was making her swell and causing her to have SOB. Has been to see cardiologist and is to return to him this week and also will see you this week. Weaned herself off of cardizem and feeling much better. Her pulse rate is still elevated but BP is good. Just wanted you to know that she had stopped and wanted to make sure that was ok. Please advise

## 2014-05-27 NOTE — Telephone Encounter (Signed)
Just make sure let cardiologist know stopped taking

## 2014-05-28 ENCOUNTER — Telehealth: Payer: Self-pay | Admitting: Family Medicine

## 2014-05-28 MED ORDER — GABAPENTIN 300 MG PO CAPS: 300.0000 mg | ORAL_CAPSULE | Freq: Three times a day (TID) | ORAL | Status: DC

## 2014-05-28 NOTE — Telephone Encounter (Signed)
Please call patient and find out what she is talking about

## 2014-05-28 NOTE — Telephone Encounter (Signed)
Patient aware. She has a follow up appt with Dr Mare Ferrari on 9/23 and she will let him know.

## 2014-05-29 NOTE — Telephone Encounter (Signed)
Quantity of gabapentin was changed and sent to pharmacy.

## 2014-05-30 ENCOUNTER — Ambulatory Visit (INDEPENDENT_AMBULATORY_CARE_PROVIDER_SITE_OTHER): Payer: BC Managed Care – PPO | Admitting: Cardiology

## 2014-05-30 ENCOUNTER — Encounter: Payer: Self-pay | Admitting: Cardiology

## 2014-05-30 VITALS — BP 118/68 | HR 118 | Ht 64.0 in | Wt 150.0 lb

## 2014-05-30 DIAGNOSIS — I498 Other specified cardiac arrhythmias: Secondary | ICD-10-CM

## 2014-05-30 DIAGNOSIS — R000 Tachycardia, unspecified: Secondary | ICD-10-CM

## 2014-05-30 MED ORDER — CARVEDILOL 3.125 MG PO TABS: 3.1250 mg | ORAL_TABLET | Freq: Two times a day (BID) | ORAL | Status: DC

## 2014-05-30 NOTE — Progress Notes (Signed)
Gabrielle Gallegos Date of Birth:  08-19-1956 [facility] [address] [place], [place]  [address] [phone]        Fax   [phone]   History of Present Illness: This 58 year old woman is seen back for a followup office visit.  She was initially seen at the request of Chevis Pretty FNP for evaluation of rapid heart action.  The patient has a history of persistent sinus tachycardia.  He has a history of previous panic attacks and anxiety attacks.  She has severe restless leg syndrome.  She sleeps poorly at night.  She is under a lot of stress looking after a special needs granddaughter age 12 who has cerebral palsy from anoxia at birth. She does not have any history of diabetes or hypertension.  She has been checked for overactive thyroid with negative results.  She has had a prior trial of metoprolol but had to stop it because it caused nausea and vomiting.  She has not had any chest pain to suggest angina pectoris.  She has had exertional dyspnea.  She had a chest x-ray in October 2014 which showed heart size at the upper limits of normal. Family history reveals her father died of lung cancer at age 15.  Her mother is living at age 42 but has had bypass surgery. Since her initial visit the patient had a 48 hour Holter monitor which showed persistent sinus tachycardia during waking hours.  Her heart rate would drop into the mid 80s during sleep.  Current Outpatient Prescriptions  Medication Sig Dispense Refill  . gabapentin (NEURONTIN) 300 MG capsule Take 1 capsule (300 mg total) by mouth 3 (three) times daily.  90 capsule  1  . meloxicam (MOBIC) 15 MG tablet TAKE ONE TABLET BY MOUTH ONCE DAILY  30 tablet  0  . carvedilol (COREG) 3.125 MG tablet Take 1 tablet (3.125 mg total) by mouth 2 (two) times daily.  60 tablet  3  . cyclobenzaprine (FLEXERIL) 5 MG tablet Take 1 tablet (5 mg total) by mouth 3 (three) times daily as needed for muscle spasms.  30 tablet  1    . diltiazem (CARDIZEM) 60 MG tablet TAKE ONE TABLET BY MOUTH 4 TIMES DAILY  30 tablet  0  . HYDROcodone-acetaminophen (NORCO/VICODIN) 5-325 MG per tablet Take 1 tablet by mouth 2 (two) times daily.  60 tablet  0   No current facility-administered medications for this visit.    Allergies  Allergen Reactions  . Diltiazem     Short of breath, bloated  . Metoprolol     Nausea and vomiting    Patient Active Problem List   Diagnosis Date Noted  . Inappropriate sinus tachycardia 05/18/2014  . Restless leg syndrome 05/18/2014  . Neuropathy 05/03/2014  . Scoliosis (and kyphoscoliosis), idiopathic 02/22/2014  . Chronic back pain 02/22/2014  . Tobacco use disorder 06/19/2013  . Acute bronchitis 06/19/2013  . Cough 06/19/2013  . Depression 01/05/2013  . Migraines 01/05/2013    History  Smoking status  . Former Smoker  . Quit date: 06/20/2013  Smokeless tobacco  . Never Used    History  Alcohol Use No    Family History  Problem Relation Age of Onset  . Heart disease Mother   . Cancer Father     Review of Systems: Constitutional: no fever chills diaphoresis or fatigue or change in weight.  Head and neck: no hearing loss, no epistaxis, no photophobia or visual disturbance. Respiratory:  No cough, shortness of breath or wheezing. Cardiovascular: No chest pain peripheral edema, palpitations. Gastrointestinal: No abdominal distention, no abdominal pain, no change in bowel habits hematochezia or melena. Genitourinary: No dysuria, no frequency, no urgency, no nocturia. Musculoskeletal:No arthralgias, no back pain, no gait disturbance or myalgias. Neurological: No dizziness, no headaches, no numbness, no seizures, no syncope, no weakness, no tremors. Hematologic: No lymphadenopathy, no easy bruising. Psychiatric: No confusion, no hallucinations, positive for restless leg syndrome    Physical Exam: Filed Vitals:   05/30/14 1013  BP: 118/68  Pulse: 118  The patient appears  to be in no distress.  Head and neck exam reveals that the pupils are equal and reactive.  The extraocular movements are full.  There is no scleral icterus.  Mouth and pharynx are benign.  No lymphadenopathy.  No carotid bruits.  The jugular venous pressure is normal.  Thyroid is not enlarged or tender.  Chest is clear to percussion and auscultation.  No rales or rhonchi.  Expansion of the chest is symmetrical.  Heart reveals no abnormal lift or heave.  First and second heart sounds are normal.  There is no murmur gallop rub or click.  The abdomen is soft and nontender.  Bowel sounds are normoactive.  There is no hepatosplenomegaly or mass.  There are no abdominal bruits.  Extremities reveal no phlebitis or edema.  Pedal pulses are good.  There is no cyanosis or clubbing.  Neurologic exam is normal strength and no lateralizing weakness.  No sensory deficits.  Integument reveals no rash  EKG to his sinus tachycardia at 106 per minute and is otherwise normal.  Since last tracing of 02/22/14, the heart rate has slowed slightly.  Assessment / Plan: 1. inappropriate sinus tachycardia 2. restless leg syndrome 3. history of anxiety attacks 4. dyspnea on exertion and previous chest x-ray showing borderline cardiomegaly  Disposition: the physical examination is unremarkable in revealing a cause of her tachycardia.  Recent lab work shows that she is not anemic and her thyroid function is normal.  I suspect that her tachycardia may be secondary to anxiety and stress.  Since last visit her echocardiogram showed normal left ventricular function and no cause for tachycardia.  Her 48 hour Holter monitor showed only sinus rhythm.  No ectopic rhythms were seen.  Her heart rate is in appropriately rapid.  No secondary causes for tachycardia have been found such as anemia or hyperthyroidism. We will treat her empirically.  She did not tolerate diltiazem and did not tolerate metoprolol previously.  We will try  low dose carvedilol 3.125 mg twice a day to try to slow her sinus tachycardia. I have asked her to let me check her again in 2 months

## 2014-05-30 NOTE — Patient Instructions (Signed)
START CARVEDILOL 3.125 MG TWICE A DAY  Your physician recommends that you schedule a follow-up appointment in: 2 [place]

## 2014-05-31 ENCOUNTER — Ambulatory Visit (INDEPENDENT_AMBULATORY_CARE_PROVIDER_SITE_OTHER): Payer: BC Managed Care – PPO | Admitting: Nurse Practitioner

## 2014-05-31 ENCOUNTER — Encounter: Payer: Self-pay | Admitting: Nurse Practitioner

## 2014-05-31 VITALS — BP 117/70 | HR 123 | Temp 97.7°F | Ht 64.0 in | Wt 150.0 lb

## 2014-05-31 DIAGNOSIS — Z13820 Encounter for screening for osteoporosis: Secondary | ICD-10-CM

## 2014-05-31 DIAGNOSIS — G2581 Restless legs syndrome: Secondary | ICD-10-CM

## 2014-05-31 DIAGNOSIS — F3289 Other specified depressive episodes: Secondary | ICD-10-CM

## 2014-05-31 DIAGNOSIS — F17200 Nicotine dependence, unspecified, uncomplicated: Secondary | ICD-10-CM

## 2014-05-31 DIAGNOSIS — G589 Mononeuropathy, unspecified: Secondary | ICD-10-CM

## 2014-05-31 DIAGNOSIS — G43819 Other migraine, intractable, without status migrainosus: Secondary | ICD-10-CM

## 2014-05-31 DIAGNOSIS — G629 Polyneuropathy, unspecified: Secondary | ICD-10-CM

## 2014-05-31 DIAGNOSIS — F32A Depression, unspecified: Secondary | ICD-10-CM

## 2014-05-31 DIAGNOSIS — M545 Low back pain, unspecified: Secondary | ICD-10-CM

## 2014-05-31 DIAGNOSIS — G43C1 Periodic headache syndromes in child or adult, intractable: Secondary | ICD-10-CM

## 2014-05-31 DIAGNOSIS — R000 Tachycardia, unspecified: Secondary | ICD-10-CM

## 2014-05-31 DIAGNOSIS — G8929 Other chronic pain: Secondary | ICD-10-CM

## 2014-05-31 DIAGNOSIS — F329 Major depressive disorder, single episode, unspecified: Secondary | ICD-10-CM

## 2014-05-31 DIAGNOSIS — F410 Panic disorder [episodic paroxysmal anxiety] without agoraphobia: Secondary | ICD-10-CM

## 2014-05-31 DIAGNOSIS — G43119 Migraine with aura, intractable, without status migrainosus: Secondary | ICD-10-CM

## 2014-05-31 DIAGNOSIS — M549 Dorsalgia, unspecified: Secondary | ICD-10-CM

## 2014-05-31 HISTORY — DX: Tachycardia, unspecified: R00.0

## 2014-05-31 MED ORDER — HYDROCODONE-ACETAMINOPHEN 5-325 MG PO TABS: 1.0000 | ORAL_TABLET | Freq: Two times a day (BID) | ORAL | Status: DC

## 2014-05-31 MED ORDER — PRAMIPEXOLE DIHYDROCHLORIDE 0.5 MG PO TABS: 0.5000 mg | ORAL_TABLET | Freq: Every evening | ORAL | Status: DC | PRN

## 2014-05-31 NOTE — Patient Instructions (Signed)
Restless Legs Syndrome Restless legs syndrome is a movement disorder. It may also be called a sensorimotor disorder.  CAUSES  No one knows what specifically causes restless legs syndrome, but it tends to run in families. It is also more common in people with low iron, in pregnancy, in people who need dialysis, and those with nerve damage (neuropathy).Some medications may make restless legs syndrome worse.Those medications include drugs to treat high blood pressure, some heart conditions, nausea, colds, allergies, and depression. SYMPTOMS Symptoms include uncomfortable sensations in the legs. These leg sensations are worse during periods of inactivity or rest. They are also worse while sitting or lying down. Individuals that have the disorder describe sensations in the legs that feel like:  Pulling.  Drawing.  Crawling.  Worming.  Boring.  Tingling.  Pins and needles.  Prickling.  Pain. The sensations are usually accompanied by an overwhelming urge to move the legs. Sudden muscle jerks may also occur. Movement provides temporary relief from the discomfort. In rare cases, the arms may also be affected. Symptoms may interfere with going to sleep (sleep onset insomnia). Restless legs syndrome may also be related to periodic limb movement disorder (PLMD). PLMD is another more common motor disorder. It also causes interrupted sleep. The symptoms from PLMD usually occur most often when you are awake. TREATMENT  Treatment for restless legs syndrome is symptomatic. This means that the symptoms are treated.   Massage and cold compresses may provide temporary relief.  Walk, stretch, or take a cold or hot bath.  Get regular exercise and a good night's sleep.  Avoid caffeine, alcohol, nicotine, and medications that can make it worse.  Do activities that provide mental stimulation like discussions, needlework, and video games. These may be helpful if you are not able to walk or stretch. Some  medications are effective in relieving the symptoms. However, many of these medications have side effects. Ask your caregiver about medications that may help your symptoms. Correcting iron deficiency may improve symptoms for some patients. Document Released: 08/14/2002 Document Revised: 01/08/2014 Document Reviewed: 11/20/2010 [employer] Patient Information 2015 [employer]. This information is not intended to replace advice given to you by your health care provider. Make sure you discuss any questions you have with your health care provider.  

## 2014-05-31 NOTE — Progress Notes (Signed)
Subjective:    Patient ID: Gabrielle Gallegos, female    DOB: 1956-01-01, 58 y.o.   MRN: [id number]  HPI Patient here today for follow up of chronic medical problems. -Restless leg syndrome/ neuropathy- gabapentin 300mg  TID which helps a little with the burning sensation but not the constant movement of legs. - tachycardia- coreg 3.125mg  daily- inermittent- feels more when she is trying to sleep- just recently started diltiazem. - Panic attacks- Cardiologist told her that he thought she was having panic attacks because she hyperventilates and feels heart racing- seems to be happening 3-4 a month lasting about 5-10 minutes at a time. - smoker- trying to cut back- smokes 5-6 a day.  Review of Systems  Constitutional: Positive for fatigue. Negative for fever and appetite change.  HENT: Negative.   Respiratory: Negative.   Cardiovascular: Negative.   Gastrointestinal: Negative.   Genitourinary: Negative.   Neurological: Negative.   Psychiatric/Behavioral: Negative.   All other systems reviewed and are negative.      Objective:   Physical Exam  Constitutional: She is oriented to person, place, and time. She appears well-developed and well-nourished. No distress.  HENT:  Nose: Nose normal.  Mouth/Throat: Oropharynx is clear and moist.  Eyes: EOM are normal.  Neck: Trachea normal, normal range of motion and full passive range of motion without pain. Neck supple. No JVD present. Carotid bruit is not present. No thyromegaly present.  Cardiovascular: Normal rate, regular rhythm, normal heart sounds and intact distal pulses.  Exam reveals no gallop and no friction rub.   No murmur heard. Pulmonary/Chest: Effort normal and breath sounds normal.  Abdominal: Soft. Bowel sounds are normal. She exhibits no distension and no mass. There is no tenderness.  Musculoskeletal: Normal range of motion.  Lymphadenopathy:    She has no cervical adenopathy.  Neurological: She is alert and oriented to  person, place, and time. She has normal reflexes.  Skin: Skin is warm and dry.  Psychiatric: She has a normal mood and affect. Her behavior is normal. Judgment and thought content normal.   BP 117/70  Pulse 123  Temp(Src) 97.7 F (36.5 C) (Oral)  Ht 5\' 4"  (1.626 m)  Wt 150 lb (68.04 kg)  BMI 25.73 kg/m2        Assessment & Plan:   1. Restless leg syndrome   2. Neuropathy   3. Intractable periodic headache syndrome   4. Depression   5. Chronic back pain   6. Tobacco use disorder   7. Tachycardia   8. Panic attacks   9. Midline low back pain without sciatica   10. Screening for osteoporosis    Meds ordered this encounter  Medications  . HYDROcodone-acetaminophen (NORCO/VICODIN) 5-325 MG per tablet    Sig: Take 1 tablet by mouth 2 (two) times daily.    Dispense:  60 tablet    Refill:  0    Order Specific Question:  Supervising Provider    Answer:  Chipper Herb [[id number]]  . pramipexole (MIRAPEX) 0.5 MG tablet    Sig: Take 1 tablet (0.5 mg total) by mouth at bedtime and may repeat dose one time if needed.    Dispense:  30 tablet    Refill:  2    Order Specific Question:  Supervising Provider    Answer:  Joycelyn Man   Added mirapex for restless leg- keep legs warm Health maintenance reviewed- to make appointment for mammo- refuses colonoscopy Labs pending  Diet and exercise encouraged Follow  up in 6 months  [place], FNP

## 2014-06-01 ENCOUNTER — Other Ambulatory Visit: Payer: Self-pay | Admitting: Nurse Practitioner

## 2014-06-01 DIAGNOSIS — E785 Hyperlipidemia, unspecified: Secondary | ICD-10-CM

## 2014-06-01 LAB — CMP14+EGFR
ALT: 45 IU/L — ABNORMAL HIGH (ref 0–32)
AST: 40 IU/L (ref 0–40)
Albumin/Globulin Ratio: 1.4 (ref 1.1–2.5)
Albumin: 4.4 g/dL (ref 3.5–5.5)
Alkaline Phosphatase: 101 IU/L (ref 39–117)
BUN/Creatinine Ratio: 107 — ABNORMAL HIGH (ref 9–23)
BUN: 16 mg/dL (ref 6–24)
CO2: 25 mmol/L (ref 18–29)
Calcium: 10 mg/dL (ref 8.7–10.2)
Chloride: 98 mmol/L (ref 97–108)
Creatinine, Ser: 0.15 mg/dL — ABNORMAL LOW (ref 0.57–1.00)
GFR calc Af Amer: 185 mL/min/{1.73_m2} (ref >59)
GFR calc non Af Amer: 160 mL/min/{1.73_m2} (ref >59)
Globulin, Total: 3.1 g/dL (ref 1.5–4.5)
Glucose: 104 mg/dL — ABNORMAL HIGH (ref 65–99)
Potassium: 4.2 mmol/L (ref 3.5–5.2)
Sodium: 142 mmol/L (ref 134–144)
Total Bilirubin: <0.2 mg/dL (ref 0.0–1.2)
Total Protein: 7.5 g/dL (ref 6.0–8.5)

## 2014-06-01 LAB — NMR, LIPOPROFILE
Cholesterol: 349 mg/dL — ABNORMAL HIGH (ref 100–199)
HDL Cholesterol by NMR: 41 mg/dL (ref >39)
HDL Particle Number: 30.8 umol/L (ref >=30.5)
LDL Particle Number: 2495 nmol/L — ABNORMAL HIGH (ref <1000)
LDL Size: 19.7 nm (ref >20.5)
LP-IR Score: 93 — ABNORMAL HIGH (ref <=45)
Small LDL Particle Number: 1979 nmol/L — ABNORMAL HIGH (ref <=527)
Triglycerides by NMR: 1315 mg/dL (ref 0–149)

## 2014-06-01 MED ORDER — FENOFIBRATE 160 MG PO TABS: 160.0000 mg | ORAL_TABLET | Freq: Every day | ORAL | Status: DC

## 2014-06-01 MED ORDER — ROSUVASTATIN CALCIUM 10 MG PO TABS: 10.0000 mg | ORAL_TABLET | Freq: Every day | ORAL | Status: DC

## 2014-06-11 ENCOUNTER — Telehealth: Payer: Self-pay | Admitting: Cardiology

## 2014-06-11 NOTE — Telephone Encounter (Signed)
Follow up:   Per pt saw PCP and was told by her, she would not give her medication for Anxiety.  Pt needs a call back on what to do please.

## 2014-06-11 NOTE — Telephone Encounter (Signed)
We cannot give her any anxiety medications.  She will need to discuss further with her PCP.

## 2014-06-11 NOTE — Telephone Encounter (Signed)
Spoke with patient and she stated she discussed anxiety medications with her PCP and she to give her anything.  Will forward to  Dr. Mare Ferrari for review.

## 2014-06-12 NOTE — Telephone Encounter (Signed)
Advised patient

## 2014-06-26 ENCOUNTER — Other Ambulatory Visit: Payer: Self-pay | Admitting: Nurse Practitioner

## 2014-06-29 ENCOUNTER — Ambulatory Visit: Payer: BC Managed Care – PPO

## 2014-06-29 ENCOUNTER — Ambulatory Visit: Payer: BC Managed Care – PPO | Admitting: Physical Medicine & Rehabilitation

## 2014-06-30 ENCOUNTER — Ambulatory Visit (INDEPENDENT_AMBULATORY_CARE_PROVIDER_SITE_OTHER): Payer: BC Managed Care – PPO | Admitting: General Practice

## 2014-06-30 VITALS — BP 116/78 | HR 102 | Temp 97.9°F | Ht 64.0 in | Wt 144.6 lb

## 2014-06-30 DIAGNOSIS — L259 Unspecified contact dermatitis, unspecified cause: Secondary | ICD-10-CM

## 2014-06-30 MED ORDER — METHYLPREDNISOLONE ACETATE 80 MG/ML IJ SUSP
80.0000 mg | Freq: Once | INTRAMUSCULAR | Status: AC
  Administered 2014-06-30: 80 mg via INTRAMUSCULAR

## 2014-06-30 NOTE — Patient Instructions (Addendum)
Rash A rash is a change in the color or texture of your skin. There are many different types of rashes. You may have other problems that accompany your rash. CAUSES   Infections.  Allergic reactions. This can include allergies to pets or foods.  Certain medicines.  Exposure to certain chemicals, soaps, or cosmetics.  Heat.  Exposure to poisonous plants.  Tumors, both cancerous and noncancerous. SYMPTOMS   Redness.  Scaly skin.  Itchy skin.  Dry or cracked skin.  Bumps.  Blisters.  Pain. DIAGNOSIS  Your caregiver may do a physical exam to determine what type of rash you have. A skin sample (biopsy) may be taken and examined under a microscope. TREATMENT  Treatment depends on the type of rash you have. Your caregiver may prescribe certain medicines. For serious conditions, you may need to see a skin doctor (dermatologist). HOME CARE INSTRUCTIONS   Avoid the substance that caused your rash.  Do not scratch your rash. This can cause infection.  You may take cool baths to help stop itching.  Only take over-the-counter or prescription medicines as directed by your caregiver.  Keep all follow-up appointments as directed by your caregiver. SEEK IMMEDIATE MEDICAL CARE IF:  You have increasing pain, swelling, or redness.  You have a fever.  You have new or severe symptoms.  You have body aches, diarrhea, or vomiting.  Your rash is not better after 3 days. MAKE SURE YOU:  Understand these instructions.  Will watch your condition.  Will get help right away if you are not doing well or get worse. Document Released: 08/14/2002 Document Revised: 11/16/2011 Document Reviewed: 06/08/2011 [facility] Patient Information 2015 [place], [place]. This information is not intended to replace advice given to you by your health care provider. Make sure you discuss any questions you have with your health care provider.  Hives Hives are itchy, red, swollen areas of the skin. They  can vary in size and location on your body. Hives can come and go for hours or several days (acute hives) or for several weeks (chronic hives). Hives do not spread from person to person (noncontagious). They may get worse with scratching, exercise, and emotional stress. CAUSES   Allergic reaction to food, additives, or drugs.  Infections, including the common cold.  Illness, such as vasculitis, lupus, or thyroid disease.  Exposure to sunlight, heat, or cold.  Exercise.  Stress.  Contact with chemicals. SYMPTOMS   Red or white swollen patches on the skin. The patches may change size, shape, and location quickly and repeatedly.  Itching.  Swelling of the hands, feet, and face. This may occur if hives develop deeper in the skin. DIAGNOSIS  Your caregiver can usually tell what is wrong by performing a physical exam. Skin or blood tests may also be done to determine the cause of your hives. In some cases, the cause cannot be determined. TREATMENT  Mild cases usually get better with medicines such as antihistamines. Severe cases may require an emergency epinephrine injection. If the cause of your hives is known, treatment includes avoiding that trigger.  HOME CARE INSTRUCTIONS   Avoid causes that trigger your hives.  Take antihistamines as directed by your caregiver to reduce the severity of your hives. Non-sedating or low-sedating antihistamines are usually recommended. Do not drive while taking an antihistamine.  Take any other medicines prescribed for itching as directed by your caregiver.  Wear loose-fitting clothing.  Keep all follow-up appointments as directed by your caregiver. SEEK MEDICAL CARE IF:  You have persistent or severe itching that is not relieved with medicine.  You have painful or swollen joints. SEEK IMMEDIATE MEDICAL CARE IF:   You have a fever.  Your tongue or lips are swollen.  You have trouble breathing or swallowing.  You feel tightness in the  throat or chest.  You have abdominal pain. These problems may be the first sign of a life-threatening allergic reaction. Call your local emergency services (911 in [place] MAKE SURE YOU:   Understand these instructions.  Will watch your condition.  Will get help right away if you are not doing well or get worse. Document Released: 08/24/2005 Document Revised: 08/29/2013 Document Reviewed: 11/17/2011 [facility] Patient Information 2015 [place], [place]. This information is not intended to replace advice given to you by your health care provider. Make sure you discuss any questions you have with your health care provider.

## 2014-06-30 NOTE — Progress Notes (Signed)
   Subjective:    Patient ID: Gabrielle Gallegos, female    DOB: [birth year], 57 y.o.   MRN: [id number]  Rash This is a new problem. The current episode started yesterday. The problem has been gradually worsening since onset. The affected locations include the left arm and right arm. The rash is characterized by redness and dryness. She was exposed to nothing. Pertinent negatives include no congestion, cough, diarrhea, fatigue, fever, joint pain, rhinorrhea, shortness of breath, sore throat or vomiting. Past treatments include moisturizer. The treatment provided no relief. Her past medical history is significant for eczema.  Patient reports an episode of hives this morning prior to showering and worsened afterwards. Reports hives gradually resolved and haven't returned.     Review of Systems  Constitutional: Negative for fever and fatigue.  HENT: Negative for congestion, rhinorrhea and sore throat.   Respiratory: Negative for cough, chest tightness and shortness of breath.   Cardiovascular: Negative for chest pain and palpitations.  Gastrointestinal: Negative for vomiting and diarrhea.  Musculoskeletal: Negative for joint pain.  Skin: Positive for rash.  Neurological: Negative for dizziness, weakness and headaches.       Objective:   Physical Exam  Constitutional: She is oriented to person, place, and time. She appears well-developed and well-nourished.  Cardiovascular: Regular rhythm.  Tachycardia present.   Pulmonary/Chest: Effort normal and breath sounds normal. No respiratory distress. She exhibits no tenderness.  Neurological: She is alert and oriented to person, place, and time.  Skin: Skin is warm and dry. Rash noted. There is erythema.  Maculopapular erythematous rash noted to bilateral lower arms, just below elbows to mid lower arm. Negative drainage or warmth.  Extremely dry skin noted to lower other area of arms, lower legs, and feet.   Psychiatric: She has a normal mood and  affect.          Assessment & Plan:  1. Contact dermatitis - methylPREDNISolone acetate (DEPO-MEDROL) injection 80 mg; Inject 1 mL (80 mg total) into the muscle once. -avoid irritants -cool compresses -may take OTC antihistamine as directed for itching -discussed importance of keeping skin moisturized, with history of eczema -RTO if symptoms worsen or unresolved -may refer to dermatologist -Patient verbalized understanding Erby Pian, FNP-C

## 2014-07-16 ENCOUNTER — Encounter: Payer: BC Managed Care – PPO | Attending: Physical Medicine & Rehabilitation

## 2014-07-16 ENCOUNTER — Ambulatory Visit: Payer: BC Managed Care – PPO | Admitting: Physical Medicine & Rehabilitation

## 2014-07-25 ENCOUNTER — Telehealth: Payer: Self-pay | Admitting: Family Medicine

## 2014-07-25 NOTE — Telephone Encounter (Signed)
Pt given appt tomorrow with Dr Laurance Flatten @ 3

## 2014-07-26 ENCOUNTER — Ambulatory Visit: Payer: BC Managed Care – PPO | Admitting: Family Medicine

## 2014-08-03 ENCOUNTER — Other Ambulatory Visit: Payer: Self-pay | Admitting: Nurse Practitioner

## 2014-08-20 ENCOUNTER — Ambulatory Visit: Payer: BC Managed Care – PPO | Admitting: Nurse Practitioner

## 2014-08-29 ENCOUNTER — Ambulatory Visit (INDEPENDENT_AMBULATORY_CARE_PROVIDER_SITE_OTHER): Payer: BC Managed Care – PPO | Admitting: Pharmacist

## 2014-08-29 ENCOUNTER — Ambulatory Visit (INDEPENDENT_AMBULATORY_CARE_PROVIDER_SITE_OTHER): Payer: BC Managed Care – PPO

## 2014-08-29 VITALS — Ht 63.0 in | Wt 145.0 lb

## 2014-08-29 DIAGNOSIS — Z13820 Encounter for screening for osteoporosis: Secondary | ICD-10-CM

## 2014-08-29 DIAGNOSIS — Z720 Tobacco use: Secondary | ICD-10-CM

## 2014-08-29 DIAGNOSIS — F17200 Nicotine dependence, unspecified, uncomplicated: Secondary | ICD-10-CM

## 2014-08-29 DIAGNOSIS — Z716 Tobacco abuse counseling: Secondary | ICD-10-CM

## 2014-08-29 LAB — HM DEXA SCAN

## 2014-08-29 MED ORDER — BUPROPION HCL ER (XL) 150 MG PO TB24: 150.0000 mg | ORAL_TABLET | Freq: Every day | ORAL | Status: DC

## 2014-08-29 NOTE — Progress Notes (Signed)
Patient ID: Gabrielle Gallegos, female   DOB: 1956-08-13, 58 y.o.   MRN: [id number]  [facility] Current Height: Height: 5\' 3"  (160 cm)      Max Lifetime Height:  5\' 3"  Current Weight: Weight: 145 lb (65.772 kg)       Ethnicity:Caucasian    HPI: Does pt already have a diagnosis of:  Osteopenia?  No Osteoporosis?  No  Back Pain?  Yes       Kyphosis?  No Patient does have diagnosis of scoliosis Prior fracture?  Yes - toe on left foot Med(s) for Osteoporosis/Osteopenia:  none Med(s) previously tried for Osteoporosis/Osteopenia:  none                                                             PMH: Age at menopause:  Surgical at 58yo Hysterectomy?  Yes Oophorectomy?  No HRT? No Steroid Use?  No Thyroid med?  No History of cancer?  No History of digestive disorders (ie Crohn's)?  No Current or previous eating disorders?  No Last Vitamin D Result:  Not previously done Last GFR Result:  160   FH/SH: Family history of osteoporosis?  Yes - niece Parent with history of hip fracture?  No Family history of breast cancer?  No Exercise?  Yes - walking a little Smoking?  Yes  - 1/2 ppd She reports that she has tried nicotine patches in past - step 1 patches worked well but when she started step 2 patches she has increased cravings and started smoking again.  She also reports that earlier this year she tried Wellbutrin for depression and notice that when she smoked she felt nauseated.  She thought it was an adverse reaction to medication and stopped Wellbutrin. Alcohol?  No    Calcium Assessment Calcium Intake  # of servings/day  Calcium mg  Milk (8 oz) 0.5  x  300  = 150mg   Yogurt (4 oz) 0 x  200 = 0  Cheese (1 oz) 1 x  200 = 200mg   Other Calcium sources   250mg   Ca supplement MVI qd = 400mg    Estimated calcium intake per day 1000mg    ** history of kidney stone - does not take calcium supplements  DEXA Results Date of Test T-Score for AP Spine L1-L4 T-Score for Total Left  Hip T-Score for Total Right Hip  08/29/2014 0.9 0.1 0.2                   Assessment: Normal BMD Tobacco abuse / smoking cessation  Recommendations: 1.  Reviewed BMD results and discussed estimated fracture risk 2.  recommend calcium 1200mg  daily through diet.  Handout given with dairy and non dairy sources of calcium 3.  recommend weight bearing exercise - 30 minutes at least 4 days per week.   4.  Counseled and educated about fall risk and prevention. 5.  Start wellbutrin XL 150mg  qd. Quit date of January 4th is set.  Patient will start Wellbutrin 1 week prior and try to decrease number of cigarettes smoked during the first week. 6.  Discussed triggers for smoking and patient given suggestions on trigger advoidance.   Recheck DEXA:  2 years  Time spent counseling patient:  30 minutes

## 2014-08-29 NOTE — Patient Instructions (Signed)

## 2014-08-30 LAB — VITAMIN D 25 HYDROXY (VIT D DEFICIENCY, FRACTURES): Vit D, 25-Hydroxy: 5.9 ng/mL — ABNORMAL LOW (ref 30.0–100.0)

## 2014-08-31 ENCOUNTER — Other Ambulatory Visit: Payer: Self-pay | Admitting: Pharmacist

## 2014-08-31 DIAGNOSIS — R7989 Other specified abnormal findings of blood chemistry: Secondary | ICD-10-CM

## 2014-08-31 MED ORDER — VITAMIN D (ERGOCALCIFEROL) 1.25 MG (50000 UNIT) PO CAPS: 50000.0000 [IU] | ORAL_CAPSULE | ORAL | Status: DC

## 2014-09-03 ENCOUNTER — Telehealth: Payer: Self-pay

## 2014-09-03 NOTE — Telephone Encounter (Signed)
Pt aware and already has prescription

## 2014-09-03 NOTE — Telephone Encounter (Signed)
-----   Message from [place], [place] sent at 08/31/2014 10:23 PM EST ----- Vitamin D is very low - recommend start vitamin D 50,000 IU once weekly for 12 weeks.  Recheck vitamin D in 12 weeks.  Lab appt made for recheck and rx sent to pharmacy on file.  Please notify patient

## 2014-09-04 ENCOUNTER — Other Ambulatory Visit: Payer: Self-pay | Admitting: Nurse Practitioner

## 2014-09-19 ENCOUNTER — Ambulatory Visit (INDEPENDENT_AMBULATORY_CARE_PROVIDER_SITE_OTHER): Payer: BLUE CROSS/BLUE SHIELD

## 2014-09-19 ENCOUNTER — Ambulatory Visit (INDEPENDENT_AMBULATORY_CARE_PROVIDER_SITE_OTHER): Payer: BLUE CROSS/BLUE SHIELD | Admitting: Family Medicine

## 2014-09-19 ENCOUNTER — Encounter: Payer: Self-pay | Admitting: Family Medicine

## 2014-09-19 VITALS — BP 138/87 | HR 110 | Temp 98.6°F | Ht 63.0 in | Wt 144.2 lb

## 2014-09-19 DIAGNOSIS — R05 Cough: Secondary | ICD-10-CM

## 2014-09-19 DIAGNOSIS — J206 Acute bronchitis due to rhinovirus: Secondary | ICD-10-CM

## 2014-09-19 DIAGNOSIS — R059 Cough, unspecified: Secondary | ICD-10-CM

## 2014-09-19 MED ORDER — PREDNISONE 20 MG PO TABS: ORAL_TABLET | ORAL | Status: DC

## 2014-09-19 MED ORDER — METHYLPREDNISOLONE ACETATE 80 MG/ML IJ SUSP
80.0000 mg | Freq: Once | INTRAMUSCULAR | Status: AC
  Administered 2014-09-19: 80 mg via INTRAMUSCULAR

## 2014-09-19 MED ORDER — IPRATROPIUM-ALBUTEROL 0.5-2.5 (3) MG/3ML IN SOLN
3.0000 mL | RESPIRATORY_TRACT | Status: DC
  Administered 2014-09-19: 3 mL via RESPIRATORY_TRACT

## 2014-09-19 MED ORDER — AMOXICILLIN 875 MG PO TABS: 875.0000 mg | ORAL_TABLET | Freq: Two times a day (BID) | ORAL | Status: DC

## 2014-09-19 MED ORDER — HYDROCODONE-HOMATROPINE 5-1.5 MG/5ML PO SYRP: 5.0000 mL | ORAL_SOLUTION | Freq: Three times a day (TID) | ORAL | Status: DC | PRN

## 2014-09-19 NOTE — Progress Notes (Signed)
   Subjective:    Patient ID: Gabrielle Gallegos, female    DOB: 04-Jan-1956, 59 y.o.   MRN: [id number]  HPI Patient is here for c/o uri and cough.  Review of Systems  Constitutional: Negative for fever.  HENT: Negative for ear pain.   Eyes: Negative for discharge.  Respiratory: Negative for cough.   Cardiovascular: Negative for chest pain.  Gastrointestinal: Negative for abdominal distention.  Endocrine: Negative for polyuria.  Genitourinary: Negative for difficulty urinating.  Musculoskeletal: Negative for gait problem and neck pain.  Skin: Negative for color change and rash.  Neurological: Negative for speech difficulty and headaches.  Psychiatric/Behavioral: Negative for agitation.       Objective:    BP 138/87 mmHg  Pulse 110  Temp(Src) 98.6 F (37 C) (Oral)  Ht 5\' 3"  (1.6 m)  Wt 144 lb 3.2 oz (65.409 kg)  BMI 25.55 kg/m2 Physical Exam  Constitutional: She is oriented to person, place, and time. She appears well-developed and well-nourished.  HENT:  Head: Normocephalic and atraumatic.  Mouth/Throat: Oropharynx is clear and moist.  Eyes: Pupils are equal, round, and reactive to light.  Neck: Normal range of motion. Neck supple.  Cardiovascular: Normal rate and regular rhythm.   No murmur heard. Pulmonary/Chest: Effort normal and breath sounds normal.  Abdominal: Soft. Bowel sounds are normal. There is no tenderness.  Neurological: She is alert and oriented to person, place, and time.  Skin: Skin is warm and dry.  Psychiatric: She has a normal mood and affect.    CXR - No infiltrates Prelimnary reading by Iverson Alamin      Assessment & Plan:     ICD-9-CM ICD-10-CM   1. Cough 786.2 R05 DG Chest 2 View     amoxicillin (AMOXIL) 875 MG tablet     predniSONE (DELTASONE) 20 MG tablet     HYDROcodone-homatropine (HYCODAN) 5-1.5 MG/5ML syrup     methylPREDNISolone acetate (DEPO-MEDROL) injection 80 mg     ipratropium-albuterol (DUONEB) 0.5-2.5 (3) MG/3ML  nebulizer solution 3 mL  2. Acute bronchitis due to Rhinovirus 466.0 J20.6 amoxicillin (AMOXIL) 875 MG tablet   079.3  predniSONE (DELTASONE) 20 MG tablet     HYDROcodone-homatropine (HYCODAN) 5-1.5 MG/5ML syrup     methylPREDNISolone acetate (DEPO-MEDROL) injection 80 mg     ipratropium-albuterol (DUONEB) 0.5-2.5 (3) MG/3ML nebulizer solution 3 mL     No Follow-up on file.  Lysbeth Penner FNP

## 2014-09-20 ENCOUNTER — Telehealth: Payer: Self-pay | Admitting: Family Medicine

## 2014-09-20 ENCOUNTER — Other Ambulatory Visit: Payer: Self-pay | Admitting: Family Medicine

## 2014-09-20 MED ORDER — ALBUTEROL SULFATE HFA 108 (90 BASE) MCG/ACT IN AERS: 2.0000 | INHALATION_SPRAY | Freq: Four times a day (QID) | RESPIRATORY_TRACT | Status: DC | PRN

## 2014-09-20 NOTE — Telephone Encounter (Signed)
Please advise 

## 2014-09-20 NOTE — Telephone Encounter (Signed)
Inhaler sent to pharm

## 2014-09-20 NOTE — Telephone Encounter (Signed)
Patient was seen yesterday and states that the middle of her back hurts a lot. She states that she is coughing a lot. Patient states that she will give it a couple of more days and if does not improve then she will need to be seen.

## 2014-09-20 NOTE — Telephone Encounter (Signed)
Patient aware inhaler sent to pharmacy.

## 2014-10-09 ENCOUNTER — Other Ambulatory Visit: Payer: Self-pay | Admitting: Nurse Practitioner

## 2014-10-16 ENCOUNTER — Encounter: Payer: Self-pay | Admitting: Nurse Practitioner

## 2014-10-16 ENCOUNTER — Ambulatory Visit (INDEPENDENT_AMBULATORY_CARE_PROVIDER_SITE_OTHER): Payer: BLUE CROSS/BLUE SHIELD | Admitting: Nurse Practitioner

## 2014-10-16 VITALS — BP 147/76 | HR 120 | Temp 97.1°F | Ht 63.0 in | Wt 145.0 lb

## 2014-10-16 DIAGNOSIS — N3001 Acute cystitis with hematuria: Secondary | ICD-10-CM

## 2014-10-16 DIAGNOSIS — R319 Hematuria, unspecified: Secondary | ICD-10-CM

## 2014-10-16 DIAGNOSIS — F410 Panic disorder [episodic paroxysmal anxiety] without agoraphobia: Secondary | ICD-10-CM

## 2014-10-16 LAB — POCT UA - MICROSCOPIC ONLY
Bacteria, U Microscopic: NEGATIVE
Casts, Ur, LPF, POC: NEGATIVE
Crystals, Ur, HPF, POC: NEGATIVE
Mucus, UA: NEGATIVE

## 2014-10-16 LAB — POCT URINALYSIS DIPSTICK
Bilirubin, UA: NEGATIVE
Glucose, UA: NEGATIVE
Nitrite, UA: NEGATIVE
Protein, UA: NEGATIVE
Spec Grav, UA: >=1.03
Urobilinogen, UA: NEGATIVE
pH, UA: 5

## 2014-10-16 MED ORDER — CIPROFLOXACIN HCL 500 MG PO TABS: 500.0000 mg | ORAL_TABLET | Freq: Two times a day (BID) | ORAL | Status: DC

## 2014-10-16 MED ORDER — SERTRALINE HCL 50 MG PO TABS: 50.0000 mg | ORAL_TABLET | Freq: Every day | ORAL | Status: DC

## 2014-10-16 NOTE — Patient Instructions (Signed)

## 2014-10-16 NOTE — Progress Notes (Addendum)
Subjective:    Patient ID: Gabrielle Gallegos, female    DOB: [birth year], 59 y.o.   MRN: [id number]  HPI  Patient is here today complaining of hematuria that she noticed last night. This morning the hematuria was more prominent. She reports burning and urinary urgency. Denies any vaginal discharge, denies urinary frequency. She report lower back pain that been going on for about a week, her pain is a 4/10 currently. She denies any lower abdominal pain. She has taken tylenol to alleviate her back pain which provide minor relief.   * patient c/o panic attacks with heart racing - saw cardiologist 4 months ago and had echo and heart was fine- said it is coming form anxiety. Is currently on nothing- She was on wellbutrin to help her stop smoking but is no longer taking.  Review of Systems  Constitutional: Negative.   HENT: Negative.   Eyes: Negative.   Respiratory: Negative.   Cardiovascular: Negative.   Gastrointestinal: Negative.   Endocrine: Negative.   Genitourinary: Positive for dysuria, urgency and hematuria. Negative for frequency and vaginal discharge.  Musculoskeletal: Negative.   Allergic/Immunologic: Negative.   Neurological: Negative.   Hematological: Negative.   Psychiatric/Behavioral: Negative.        Objective:   Physical Exam  Constitutional: She is oriented to person, place, and time. She appears well-developed and well-nourished.  HENT:  Head: Normocephalic.  Eyes: Pupils are equal, round, and reactive to light.  Neck: Normal range of motion.  Cardiovascular: Normal rate and regular rhythm.   Pulmonary/Chest: Effort normal.  Abdominal: Soft.  Musculoskeletal: Normal range of motion.  No CVA tenderness.   Neurological: She is alert and oriented to person, place, and time.  Skin: Skin is warm.  Psychiatric: She has a normal mood and affect.   BP 147/76 mmHg  Pulse 120  Temp(Src) 97.1 F (36.2 C) (Oral)  Ht 5\' 3"  (1.6 m)  Wt 145 lb (65.772 kg)  BMI 25.69  kg/m2  Results for orders placed or performed in visit on 10/16/14  POCT UA - Microscopic Only  Result Value Ref Range   WBC, Ur, HPF, POC 5-7    RBC, urine, microscopic 20-30    Bacteria, U Microscopic negative    Mucus, UA negative    Epithelial cells, urine per micros occ    Crystals, Ur, HPF, POC negative    Casts, Ur, LPF, POC negative    Yeast, UA moderate   POCT urinalysis dipstick  Result Value Ref Range   Color, UA gold    Clarity, UA cloudy    Glucose, UA negative    Bilirubin, UA negative    Ketones, UA large    Spec Grav, UA >=1.030    Blood, UA large    pH, UA 5.0    Protein, UA negative    Urobilinogen, UA negative    Nitrite, UA negative    Leukocytes, UA small (1+)           Assessment & Plan:   1. Hematuria - POCT UA - Microscopic Only - POCT urinalysis dipstick  2. Acute cystitis with hematuria Take medication as prescribe Cotton underwear Take shower not bath Cranberry juice, yogurt Force fluids AZO over the counter X2 days Culture pending RTO prn - ciprofloxacin (CIPRO) 500 MG tablet; Take 1 tablet (500 mg total) by mouth 2 (two) times daily.  Dispense: 14 tablet; Refill: 0 - Urine culture  3. Panic attacks Stress management Medication side effects discussed rto in 3  weeks recheck - sertraline (ZOLOFT) 50 MG tablet; Take 1 tablet (50 mg total) by mouth daily.  Dispense: 30 tablet; Refill: [place], FNP

## 2014-10-16 NOTE — Addendum Note (Signed)
Addended by: Chevis Pretty on: 10/16/2014 02:17 PM   Modules accepted: Orders, Medications, Level of Service

## 2014-10-18 LAB — URINE CULTURE

## 2014-10-22 ENCOUNTER — Telehealth: Payer: Self-pay | Admitting: *Deleted

## 2014-10-22 NOTE — Telephone Encounter (Signed)
-----   Message from [facility] [place] sent at 10/18/2014  2:53 PM EST ----- Can wait until next week- just continue to watch blood- does she have any on her panties?

## 2014-10-22 NOTE — Telephone Encounter (Signed)
Spoke with pt to inform of MMM recommendations No blood noted  She will keep appt or come in sooner if sxs return

## 2014-10-25 ENCOUNTER — Encounter: Payer: Self-pay | Admitting: Nurse Practitioner

## 2014-10-25 ENCOUNTER — Encounter (INDEPENDENT_AMBULATORY_CARE_PROVIDER_SITE_OTHER): Payer: Self-pay

## 2014-10-25 ENCOUNTER — Ambulatory Visit (INDEPENDENT_AMBULATORY_CARE_PROVIDER_SITE_OTHER): Payer: BLUE CROSS/BLUE SHIELD | Admitting: Nurse Practitioner

## 2014-10-25 ENCOUNTER — Ambulatory Visit (INDEPENDENT_AMBULATORY_CARE_PROVIDER_SITE_OTHER): Payer: BLUE CROSS/BLUE SHIELD

## 2014-10-25 VITALS — BP 135/83 | HR 93 | Temp 96.5°F | Ht 63.0 in | Wt 147.0 lb

## 2014-10-25 DIAGNOSIS — R319 Hematuria, unspecified: Secondary | ICD-10-CM

## 2014-10-25 LAB — POCT URINALYSIS DIPSTICK
Bilirubin, UA: NEGATIVE
Blood, UA: NEGATIVE
Glucose, UA: NEGATIVE
Ketones, UA: NEGATIVE
Nitrite, UA: NEGATIVE
Protein, UA: NEGATIVE
Spec Grav, UA: 1.015
Urobilinogen, UA: NEGATIVE
pH, UA: 6

## 2014-10-25 LAB — POCT UA - MICROSCOPIC ONLY
Bacteria, U Microscopic: NEGATIVE
Casts, Ur, LPF, POC: NEGATIVE
Crystals, Ur, HPF, POC: NEGATIVE
Mucus, UA: NEGATIVE
Yeast, UA: NEGATIVE

## 2014-10-25 NOTE — Progress Notes (Signed)
   Subjective:    Patient ID: Gabrielle Gallegos, female    DOB: [birth year], 59 y.o.   MRN: [id number]  HPI Patient in today c/o vaginal bleeding. This started about 2 weeks ago with UTI - thought she had hematuria but was actually having vaginal bleeding- Said that she passed a few clots and then just stopped bleeding- Patient had hysterectomy at age 18. She has history of kidney stones and has had back pain with slight nausea.    Review of Systems  Constitutional: Negative.   HENT: Negative.   Respiratory: Negative.   Cardiovascular: Negative.   Genitourinary: Positive for hematuria.  Neurological: Negative.   Psychiatric/Behavioral: Negative.   All other systems reviewed and are negative.      Objective:   Physical Exam  Constitutional: She is oriented to person, place, and time. She appears well-developed and well-nourished.  Cardiovascular: Normal rate, regular rhythm and normal heart sounds.   Pulmonary/Chest: Effort normal and breath sounds normal.  Genitourinary:  Vaginal cuff intact- no bleeding vaginally No adnexal masses or tenderness  Neurological: She is alert and oriented to person, place, and time.  Skin: Skin is warm and dry.  Psychiatric: She has a normal mood and affect. Her behavior is normal. Judgment and thought content normal.   BP 135/83 mmHg  Pulse 93  Temp(Src) 96.5 F (35.8 C) (Oral)  Ht 5\' 3"  (1.6 m)  Wt 147 lb (66.679 kg)  BMI 26.05 kg/m2  KUB-moderate stool burden- no visible kidney stones-Preliminary reading by Ronnald Collum, FNP  [facility] Results for orders placed or performed in visit on 10/25/14  POCT UA - Microscopic Only  Result Value Ref Range   WBC, Ur, HPF, POC 5-10    RBC, urine, microscopic rare    Bacteria, U Microscopic neg    Mucus, UA neg    Epithelial cells, urine per micros rare    Crystals, Ur, HPF, POC neg    Casts, Ur, LPF, POC neg    Yeast, UA neg   POCT urinalysis dipstick  Result Value Ref Range   Color, UA yellow    Clarity, UA clear    Glucose, UA neg    Bilirubin, UA neg    Ketones, UA neg    Spec Grav, UA 1.015    Blood, UA neg    pH, UA 6.0    Protein, UA neg    Urobilinogen, UA negative    Nitrite, UA neg    Leukocytes, UA Trace          Assessment & Plan:  1. Blood in urine keep diary of episodes RTO if reoccurs - POCT UA - Microscopic Only - POCT urinalysis dipstick - DG Abd 1 View; Future * feel like patient probably passed a kidney stone  Mary-Margaret Hassell Done, FNP

## 2014-10-25 NOTE — Patient Instructions (Signed)

## 2014-10-26 ENCOUNTER — Telehealth: Payer: Self-pay | Admitting: Nurse Practitioner

## 2014-10-26 MED ORDER — HYDROCODONE-ACETAMINOPHEN 5-325 MG PO TABS: 1.0000 | ORAL_TABLET | Freq: Four times a day (QID) | ORAL | Status: DC | PRN

## 2014-10-26 NOTE — Telephone Encounter (Signed)
Patient aware rx ready to be picked up 

## 2014-10-28 ENCOUNTER — Other Ambulatory Visit: Payer: Self-pay | Admitting: Cardiology

## 2014-11-07 ENCOUNTER — Ambulatory Visit (INDEPENDENT_AMBULATORY_CARE_PROVIDER_SITE_OTHER): Payer: Commercial Indemnity

## 2014-11-07 ENCOUNTER — Encounter: Payer: Self-pay | Admitting: Nurse Practitioner

## 2014-11-07 ENCOUNTER — Ambulatory Visit (INDEPENDENT_AMBULATORY_CARE_PROVIDER_SITE_OTHER): Payer: Commercial Indemnity | Admitting: Nurse Practitioner

## 2014-11-07 VITALS — BP 138/84 | HR 112 | Temp 96.9°F | Ht 63.0 in | Wt 147.0 lb

## 2014-11-07 DIAGNOSIS — N200 Calculus of kidney: Secondary | ICD-10-CM | POA: Diagnosis not present

## 2014-11-07 DIAGNOSIS — K5900 Constipation, unspecified: Secondary | ICD-10-CM | POA: Diagnosis not present

## 2014-11-07 DIAGNOSIS — R319 Hematuria, unspecified: Secondary | ICD-10-CM | POA: Diagnosis not present

## 2014-11-07 LAB — POCT UA - MICROSCOPIC ONLY
Casts, Ur, LPF, POC: NEGATIVE
Crystals, Ur, HPF, POC: NEGATIVE
Yeast, UA: NEGATIVE

## 2014-11-07 LAB — POCT URINALYSIS DIPSTICK
Bilirubin, UA: NEGATIVE
Glucose, UA: NEGATIVE
Ketones, UA: NEGATIVE
Leukocytes, UA: NEGATIVE
Nitrite, UA: NEGATIVE
Spec Grav, UA: 1.025
Urobilinogen, UA: NEGATIVE
pH, UA: 5

## 2014-11-07 NOTE — Patient Instructions (Signed)

## 2014-11-07 NOTE — Progress Notes (Signed)
   Subjective:    Patient ID: Gabrielle Gallegos, female    DOB: 10-05-1955, 59 y.o.   MRN: [id number]  HPI Patient here today for recheck of kidney stone - was seen to weeks ago with hematuria and back pain. 65mm kidney stone in left kidney on KUB. Pain has worsened and patient says that sh ehas not passed anything. Wants rechecked.   Review of Systems  Constitutional: Negative.   HENT: Negative.   Respiratory: Negative.   Cardiovascular: Negative.   Gastrointestinal: Negative.   Genitourinary: Positive for frequency and hematuria.  Neurological: Negative.   Psychiatric/Behavioral: Negative.   All other systems reviewed and are negative.      Objective:   Physical Exam  Constitutional: She is oriented to person, place, and time. She appears well-developed and well-nourished.  Cardiovascular: Normal rate, regular rhythm and normal heart sounds.   Pulmonary/Chest: Effort normal and breath sounds normal.  Genitourinary:  Left CVA tenderness  Neurological: She is alert and oriented to person, place, and time.  Skin: Skin is warm and dry.  Psychiatric: She has a normal mood and affect. Her behavior is normal. Judgment and thought content normal.    BP 138/84 mmHg  Pulse 112  Temp(Src) 96.9 F (36.1 C) (Oral)  Ht 5\' 3"  (1.6 m)  Wt 147 lb (66.679 kg)  BMI 26.05 kg/m2  Results for orders placed or performed in visit on 11/07/14  POCT urinalysis dipstick  Result Value Ref Range   Color, UA gold    Clarity, UA clear    Glucose, UA neg    Bilirubin, UA neg    Ketones, UA neg    Spec Grav, UA 1.025    Blood, UA small    pH, UA 5.0    Protein, UA 3+    Urobilinogen, UA negative    Nitrite, UA neg    Leukocytes, UA Negative   POCT UA - Microscopic Only  Result Value Ref Range   WBC, Ur, HPF, POC 1-3    RBC, urine, microscopic rare    Bacteria, U Microscopic few    Mucus, UA few    Epithelial cells, urine per micros few    Crystals, Ur, HPF, POC neg    Casts, Ur, LPF, POC  neg    Yeast, UA neg     KUB- kidnay stone not visible dull to Moderate stool burden throughout colon-Preliminary reading by Ronnald Collum, FNP  [facility]      Assessment & Plan:  Hematuria - Plan: POCT urinalysis dipstick, POCT UA - Microscopic Only  Kidney stone - Plan: DG Abd 1 View  Constipation, unspecified constipation type  Milk of magnesia and prune juice- repeat Every 6 hours until good results- Once good results use miralax QOD Force fluids RTO prn  Mary-Margaret Hassell Done, FNP

## 2014-11-13 ENCOUNTER — Telehealth: Payer: Self-pay | Admitting: Nurse Practitioner

## 2014-11-13 MED ORDER — HYDROCODONE-ACETAMINOPHEN 5-325 MG PO TABS: 1.0000 | ORAL_TABLET | Freq: Four times a day (QID) | ORAL | Status: DC | PRN

## 2014-11-13 NOTE — Telephone Encounter (Signed)
lortab rx ready for pick up No more if still hurting that bad needs to see urologist

## 2014-11-13 NOTE — Telephone Encounter (Signed)
Patient aware of results.

## 2014-11-23 ENCOUNTER — Telehealth: Payer: Self-pay | Admitting: Nurse Practitioner

## 2014-11-23 ENCOUNTER — Other Ambulatory Visit: Payer: Self-pay | Admitting: Cardiology

## 2014-11-23 ENCOUNTER — Other Ambulatory Visit: Payer: Self-pay | Admitting: Nurse Practitioner

## 2014-11-23 DIAGNOSIS — N200 Calculus of kidney: Secondary | ICD-10-CM

## 2014-11-23 NOTE — Telephone Encounter (Signed)
Referral put in for urology per Ronnald Collum , FNP's notes from yesterday.

## 2014-12-03 ENCOUNTER — Other Ambulatory Visit: Payer: Self-pay

## 2014-12-10 ENCOUNTER — Telehealth: Payer: Self-pay | Admitting: Nurse Practitioner

## 2014-12-10 NOTE — Telephone Encounter (Signed)
See if can move urology appointment up- kidney stone

## 2014-12-10 NOTE — Telephone Encounter (Signed)
Stp she states she only has 3 lortab left and she usually has to take 1-1/2 lortab to help the pain because one isn't enough. Her appt isn't until 4/28 and they don't have anything earlier, pt states she is well hydrated. Is there anything else we can do? Please advise.

## 2014-12-11 MED ORDER — HYDROCODONE-ACETAMINOPHEN 5-325 MG PO TABS: 1.0000 | ORAL_TABLET | Freq: Four times a day (QID) | ORAL | Status: DC | PRN

## 2014-12-11 NOTE — Telephone Encounter (Signed)
Pt aware written Rx at front desk ready to be picked up

## 2014-12-11 NOTE — Telephone Encounter (Signed)
lortab rx ready for pick up

## 2014-12-11 NOTE — Telephone Encounter (Signed)
She has tried to move up appt, they cant see her any sooner

## 2014-12-12 NOTE — Telephone Encounter (Signed)
Pain rx up front to be picked up- was notified yesterday

## 2014-12-12 NOTE — Telephone Encounter (Signed)
Please advise 

## 2014-12-27 ENCOUNTER — Other Ambulatory Visit: Payer: Self-pay | Admitting: Family Medicine

## 2014-12-27 NOTE — Telephone Encounter (Signed)
Last seen 11/07/14  This med not on EPIC list MMM

## 2014-12-28 ENCOUNTER — Telehealth: Payer: Self-pay | Admitting: *Deleted

## 2014-12-28 ENCOUNTER — Encounter: Payer: Self-pay | Admitting: Nurse Practitioner

## 2014-12-28 NOTE — Telephone Encounter (Signed)
Detailed message left that letter has been faxed.

## 2015-01-01 ENCOUNTER — Telehealth: Payer: Self-pay | Admitting: *Deleted

## 2015-01-01 ENCOUNTER — Encounter: Payer: Self-pay | Admitting: Nurse Practitioner

## 2015-01-01 NOTE — Telephone Encounter (Signed)
Letter ready for pick up once signed by Dr. Laurance Flatten

## 2015-01-01 NOTE — Telephone Encounter (Signed)
Letter has been faxed per Glean Salen

## 2015-01-01 NOTE — Telephone Encounter (Signed)
The letter that you wrote for Gabrielle Gallegos, needs to says that she needs 40 hours a week and must be signed by a MD. This needs to be done asap, she is about to lose [employer] services.

## 2015-02-14 ENCOUNTER — Ambulatory Visit: Payer: Commercial Indemnity | Admitting: Physician Assistant

## 2015-02-15 ENCOUNTER — Ambulatory Visit (INDEPENDENT_AMBULATORY_CARE_PROVIDER_SITE_OTHER): Payer: Commercial Indemnity | Admitting: Physician Assistant

## 2015-02-15 ENCOUNTER — Encounter: Payer: Self-pay | Admitting: Physician Assistant

## 2015-02-15 VITALS — BP 132/77 | HR 112 | Temp 97.9°F | Ht 63.0 in | Wt 144.0 lb

## 2015-02-15 DIAGNOSIS — G2581 Restless legs syndrome: Secondary | ICD-10-CM | POA: Diagnosis not present

## 2015-02-15 DIAGNOSIS — R000 Tachycardia, unspecified: Secondary | ICD-10-CM

## 2015-02-15 DIAGNOSIS — I471 Supraventricular tachycardia: Secondary | ICD-10-CM

## 2015-02-15 MED ORDER — HYDROCODONE-ACETAMINOPHEN 5-325 MG PO TABS: 1.0000 | ORAL_TABLET | Freq: Four times a day (QID) | ORAL | Status: DC | PRN

## 2015-02-15 MED ORDER — GABAPENTIN 300 MG PO CAPS: 300.0000 mg | ORAL_CAPSULE | Freq: Three times a day (TID) | ORAL | Status: DC

## 2015-02-15 MED ORDER — CARVEDILOL 3.125 MG PO TABS: ORAL_TABLET | ORAL | Status: DC

## 2015-02-15 NOTE — Progress Notes (Signed)
   Subjective:    Patient ID: MAKITA BLOW, female    DOB: 12-21-1955, 59 y.o.   MRN: [id number]  HPI 59 y/o female with h/o scoliosis, peripheral neuropathy, restless leg syndrome presents with c/o increased pain  In legs. She has been taking Hydrocodone, 5/325 q 6 hours prn for pain, gabapentin '300mg'$  once daily, mirapex 0.'5mg'$  qhs which helped at first, but does not seem to be helping now. Describes the pain as aching and burning.   She also has a "knot" on her anterior right shin x 1 month that alternates in size. Denies trauma.   She is not taking her Crestor at this time and would like to be reassessed and new labs prior to having her medication refills. She is not fasting today.     Review of Systems  Constitutional: Negative.   Musculoskeletal: Positive for back pain and arthralgias (leg and hip).       Bilateral leg pain, radiates to hips   Skin: Negative.        Objective:   Physical Exam  Constitutional: She appears well-developed and well-nourished. No distress.  Skin: She is not diaphoretic.  Slightly Raised nodule on anterior right shin, slightly mobile. Appears to be inflammation of tissues.negative for ecchymosis or erythema. Non TTP   Nursing note and vitals reviewed.         Assessment & Plan:  1. RLS (restless legs syndrome)  - gabapentin (NEURONTIN) 300 MG capsule; Take 1 capsule (300 mg total) by mouth 3 (three) times daily.  Dispense: 90 capsule; Refill: 1 - HYDROcodone-acetaminophen (LORTAB) 5-325 MG per tablet; Take 1 tablet by mouth every 6 (six) hours as needed for moderate pain.  Dispense: 40 tablet; Refill: 0  2. Inappropriate sinus tachycardia  - carvedilol (COREG) 3.125 MG tablet; TAKE ONE TABLET BY MOUTH TWICE DAILY  Dispense: 60 tablet; Refill: 0   Reassess lesion on right leg if persists.   Continue all meds Labs pending Health Maintenance reviewed Diet and exercise encouraged RTO 1 month for reassessment with MMM and labs to confirm  if she needs Crestor (at patient request)  Ryott Rafferty A. Benjamin Stain PA-C

## 2015-02-28 ENCOUNTER — Telehealth: Payer: Self-pay | Admitting: Pharmacist

## 2015-02-28 NOTE — Telephone Encounter (Signed)
Patient reminded about labs - appt set for tomorrow at 9:15am

## 2015-03-01 ENCOUNTER — Other Ambulatory Visit: Payer: Self-pay

## 2015-03-04 ENCOUNTER — Other Ambulatory Visit (INDEPENDENT_AMBULATORY_CARE_PROVIDER_SITE_OTHER): Payer: Commercial Indemnity

## 2015-03-04 DIAGNOSIS — Z79899 Other long term (current) drug therapy: Secondary | ICD-10-CM | POA: Diagnosis not present

## 2015-03-04 DIAGNOSIS — R7989 Other specified abnormal findings of blood chemistry: Secondary | ICD-10-CM

## 2015-03-04 NOTE — Progress Notes (Signed)
Lab only 

## 2015-03-05 LAB — LIPID PANEL
Chol/HDL Ratio: 7 ratio units — ABNORMAL HIGH (ref 0.0–4.4)
Cholesterol, Total: 265 mg/dL — ABNORMAL HIGH (ref 100–199)
HDL: 38 mg/dL — ABNORMAL LOW (ref >39)
Triglycerides: 485 mg/dL — ABNORMAL HIGH (ref 0–149)

## 2015-03-05 LAB — HEPATIC FUNCTION PANEL
ALT: 27 IU/L (ref 0–32)
AST: 26 IU/L (ref 0–40)
Albumin: 4.2 g/dL (ref 3.5–5.5)
Alkaline Phosphatase: 86 IU/L (ref 39–117)
Bilirubin Total: 0.3 mg/dL (ref 0.0–1.2)
Bilirubin, Direct: 0.1 mg/dL (ref 0.00–0.40)
Total Protein: 7.3 g/dL (ref 6.0–8.5)

## 2015-03-05 LAB — VITAMIN D 25 HYDROXY (VIT D DEFICIENCY, FRACTURES): Vit D, 25-Hydroxy: 10.6 ng/mL — ABNORMAL LOW (ref 30.0–100.0)

## 2015-03-08 ENCOUNTER — Telehealth: Payer: Self-pay | Admitting: Pharmacist

## 2015-03-08 MED ORDER — VITAMIN D (ERGOCALCIFEROL) 1.25 MG (50000 UNIT) PO CAPS: ORAL_CAPSULE | ORAL | Status: DC

## 2015-03-08 MED ORDER — ATORVASTATIN CALCIUM 40 MG PO TABS: 40.0000 mg | ORAL_TABLET | Freq: Every day | ORAL | Status: DC

## 2015-03-08 NOTE — Telephone Encounter (Signed)
Patient notified of lab results and Rx for atorvastatin and vitamin D sent to pharmacy . Also made appt in 6 weeks to see MMM.

## 2015-03-21 ENCOUNTER — Ambulatory Visit (INDEPENDENT_AMBULATORY_CARE_PROVIDER_SITE_OTHER): Payer: Commercial Indemnity | Admitting: Nurse Practitioner

## 2015-03-21 ENCOUNTER — Encounter: Payer: Self-pay | Admitting: Nurse Practitioner

## 2015-03-21 VITALS — BP 158/92 | HR 133 | Temp 96.9°F | Ht 63.0 in | Wt 142.0 lb

## 2015-03-21 DIAGNOSIS — F411 Generalized anxiety disorder: Secondary | ICD-10-CM

## 2015-03-21 DIAGNOSIS — F329 Major depressive disorder, single episode, unspecified: Secondary | ICD-10-CM

## 2015-03-21 DIAGNOSIS — F32A Depression, unspecified: Secondary | ICD-10-CM

## 2015-03-21 MED ORDER — SERTRALINE HCL 100 MG PO TABS: 100.0000 mg | ORAL_TABLET | Freq: Every day | ORAL | Status: DC

## 2015-03-21 MED ORDER — CLONAZEPAM 0.5 MG PO TABS: 0.5000 mg | ORAL_TABLET | Freq: Two times a day (BID) | ORAL | Status: DC | PRN

## 2015-03-21 NOTE — Progress Notes (Signed)
   Subjective:    Patient ID: Gabrielle Gallegos, female    DOB: 1955-11-19, 59 y.o.   MRN: [id number]  HPI Patient here today c/o anxiety. She says that her mom had a heart attack in middle of May but survived. Then her sister in law had a heart attack 02-27-2023 and died.  Then on Mar 24, 2023 her sister died after having surgery.Patient is just very tearful anxious. All she wants to do is cry. Is unable to concentrate and get anything done. Her mom has dementia and calls her 20 times a day telling her the samethings over and over again. Patient says she just can't take it anymore. Denies suicidal thoughts. She is currently on zoloft which has helped some.    Review of Systems  Constitutional: Negative.   HENT: Negative.   Respiratory: Negative.   Cardiovascular: Negative.   Genitourinary: Negative.   Neurological: Negative.   Psychiatric/Behavioral: Negative for suicidal ideas. The patient is nervous/anxious.   All other systems reviewed and are negative.      Objective:   Physical Exam  Constitutional: She is oriented to person, place, and time. She appears well-developed and well-nourished.  Cardiovascular: Normal rate, regular rhythm and normal heart sounds.   Pulmonary/Chest: Effort normal and breath sounds normal.  Neurological: She is alert and oriented to person, place, and time.  Skin: Skin is warm and dry.  Psychiatric: She has a normal mood and affect. Her behavior is normal. Judgment and thought content normal.  Tearful during exam- poor eye contact    BP 158/92 mmHg  Pulse 133  Temp(Src) 96.9 F (36.1 C) (Oral)  Ht '5\' 3"'$  (1.6 m)  Wt 142 lb (64.411 kg)  BMI 25.16 kg/m2       Assessment & Plan:  1. GAD (generalized anxiety disorder) Stress management - clonazePAM (KLONOPIN) 0.5 MG tablet; Take 1 tablet (0.5 mg total) by mouth 2 (two) times daily as needed for anxiety.  Dispense: 60 tablet; Refill: 1  2. Depression Increase dose of zoloft today Side effects  reviewed - sertraline (ZOLOFT) 100 MG tablet; Take 1 tablet (100 mg total) by mouth daily.  Dispense: 30 tablet; Refill: [place], FNP

## 2015-03-21 NOTE — Patient Instructions (Signed)
Stress and Stress Management Stress is a normal reaction to life events. It is what you feel when life demands more than you are used to or more than you can handle. Some stress can be useful. For example, the stress reaction can help you catch the last bus of the day, study for a test, or meet a deadline at work. But stress that occurs too often or for too long can cause problems. It can affect your emotional health and interfere with relationships and normal daily activities. Too much stress can weaken your immune system and increase your risk for physical illness. If you already have a medical problem, stress can make it worse. CAUSES  All sorts of life events may cause stress. An event that causes stress for one person may not be stressful for another person. Major life events commonly cause stress. These may be positive or negative. Examples include losing your job, moving into a new home, getting married, having a baby, or losing a loved one. Less obvious life events may also cause stress, especially if they occur day after day or in combination. Examples include working long hours, driving in traffic, caring for children, being in debt, or being in a difficult relationship. SIGNS AND SYMPTOMS Stress may cause emotional symptoms including, the following:  Anxiety. This is feeling worried, afraid, on edge, overwhelmed, or out of control.  Anger. This is feeling irritated or impatient.  Depression. This is feeling sad, down, helpless, or guilty.  Difficulty focusing, remembering, or making decisions. Stress may cause physical symptoms, including the following:   Aches and pains. These may affect your head, neck, back, stomach, or other areas of your body.  Tight muscles or clenched jaw.  Low energy or trouble sleeping. Stress may cause unhealthy behaviors, including the following:   Eating to feel better (overeating) or skipping meals.  Sleeping too little, too much, or both.  Working  too much or putting off tasks (procrastination).  Smoking, drinking alcohol, or using drugs to feel better. DIAGNOSIS  Stress is diagnosed through an assessment by your health care provider. Your health care provider will ask questions about your symptoms and any stressful life events.Your health care provider will also ask about your medical history and may order blood tests or other tests. Certain medical conditions and medicine can cause physical symptoms similar to stress. Mental illness can cause emotional symptoms and unhealthy behaviors similar to stress. Your health care provider may refer you to a mental health professional for further evaluation.  TREATMENT  Stress management is the recommended treatment for stress.The goals of stress management are reducing stressful life events and coping with stress in healthy ways.  Techniques for reducing stressful life events include the following:  Stress identification. Self-monitor for stress and identify what causes stress for you. These skills may help you to avoid some stressful events.  Time management. Set your priorities, keep a calendar of events, and learn to say "no." These tools can help you avoid making too many commitments. Techniques for coping with stress include the following:  Rethinking the problem. Try to think realistically about stressful events rather than ignoring them or overreacting. Try to find the positives in a stressful situation rather than focusing on the negatives.  Exercise. Physical exercise can release both physical and emotional tension. The key is to find a form of exercise you enjoy and do it regularly.  Relaxation techniques. These relax the body and mind. Examples include yoga, meditation, tai chi, biofeedback, deep  breathing, progressive muscle relaxation, listening to music, being out in nature, journaling, and other hobbies. Again, the key is to find one or more that you enjoy and can do  regularly.  Healthy lifestyle. Eat a balanced diet, get plenty of sleep, and do not smoke. Avoid using alcohol or drugs to relax.  Strong support network. Spend time with family, friends, or other people you enjoy being around.Express your feelings and talk things over with someone you trust. Counseling or talktherapy with a mental health professional may be helpful if you are having difficulty managing stress on your own. Medicine is typically not recommended for the treatment of stress.Talk to your health care provider if you think you need medicine for symptoms of stress. HOME CARE INSTRUCTIONS  Keep all follow-up visits as directed by your health care provider.  Take all medicines as directed by your health care provider. SEEK MEDICAL CARE IF:  Your symptoms get worse or you start having new symptoms.  You feel overwhelmed by your problems and can no longer manage them on your own. SEEK IMMEDIATE MEDICAL CARE IF:  You feel like hurting yourself or someone else. Document Released: 02/17/2001 Document Revised: 01/08/2014 Document Reviewed: 04/18/2013 [employer] Patient Information 2015 [employer]. This information is not intended to replace advice given to you by your health care provider. Make sure you discuss any questions you have with your health care provider.  

## 2015-03-28 ENCOUNTER — Ambulatory Visit (INDEPENDENT_AMBULATORY_CARE_PROVIDER_SITE_OTHER): Payer: Commercial Indemnity | Admitting: Nurse Practitioner

## 2015-03-28 ENCOUNTER — Encounter: Payer: Self-pay | Admitting: Nurse Practitioner

## 2015-03-28 VITALS — BP 144/84 | HR 141 | Temp 96.9°F | Ht 63.0 in | Wt 144.0 lb

## 2015-03-28 DIAGNOSIS — I479 Paroxysmal tachycardia, unspecified: Secondary | ICD-10-CM

## 2015-03-28 DIAGNOSIS — M7989 Other specified soft tissue disorders: Secondary | ICD-10-CM

## 2015-03-28 MED ORDER — CARVEDILOL 6.25 MG PO TABS
6.2500 mg | ORAL_TABLET | Freq: Two times a day (BID) | ORAL | Status: DC
Start: 2015-03-28 — End: 2015-04-23

## 2015-03-28 NOTE — Patient Instructions (Signed)
Nonspecific Tachycardia  Tachycardia is a faster than normal heartbeat (more than 100 beats per minute). In adults, the heart normally beats between 60 and 100 times a minute. A fast heartbeat may be a normal response to exercise or stress. It does not necessarily mean that something is wrong. However, sometimes when your heart beats too fast it may not be able to pump enough blood to the rest of your body. This can result in chest pain, shortness of breath, dizziness, and even fainting. Nonspecific tachycardia means that the specific cause or pattern of your tachycardia is unknown.  CAUSES   Tachycardia may be harmless or it may be due to a more serious underlying cause. Possible causes of tachycardia include:  · Exercise or exertion.  · Fever.  · Pain or injury.  · Infection.  · Loss of body fluids (dehydration).  · Overactive thyroid.  · Lack of red blood cells (anemia).  · Anxiety and stress.  · Alcohol.  · Caffeine.  · Tobacco products.  · Diet pills.  · Illegal drugs.  · Heart disease.  SYMPTOMS  · Rapid or irregular heartbeat (palpitations).  · Suddenly feeling your heart beating (cardiac awareness).  · Dizziness.  · Tiredness (fatigue).  · Shortness of breath.  · Chest pain.  · Nausea.  · Fainting.  DIAGNOSIS   Your caregiver will perform a physical exam and take your medical history. In some cases, a heart specialist (cardiologist) may be consulted. Your caregiver may also order:  · Blood tests.  · Electrocardiography. This test records the electrical activity of your heart.  · A heart monitoring test.  TREATMENT   Treatment will depend on the likely cause of your tachycardia. The goal is to treat the underlying cause of your tachycardia. Treatment methods may include:  · Replacement of fluids or blood through an intravenous (IV) tube for moderate to severe dehydration or anemia.  · New medicines or changes in your current medicines.  · Diet and lifestyle changes.  · Treatment for certain  infections.  · Stress relief or relaxation methods.  HOME CARE INSTRUCTIONS   · Rest.  · Drink enough fluids to keep your urine clear or pale yellow.  · Do not smoke.  · Avoid:  ¨ Caffeine.  ¨ Tobacco.  ¨ Alcohol.  ¨ Chocolate.  ¨ Stimulants such as over-the-counter diet pills or pills that help you stay awake.  ¨ Situations that cause anxiety or stress.  ¨ Illegal drugs such as marijuana, phencyclidine (PCP), and cocaine.  · Only take medicine as directed by your caregiver.  · Keep all follow-up appointments as directed by your caregiver.  SEEK IMMEDIATE MEDICAL CARE IF:   · You have pain in your chest, upper arms, jaw, or neck.  · You become weak, dizzy, or feel faint.  · You have palpitations that will not go away.  · You vomit, have diarrhea, or pass blood in your stool.  · Your skin is cool, pale, and wet.  · You have a fever that will not go away with rest, fluids, and medicine.  MAKE SURE YOU:   · Understand these instructions.  · Will watch your condition.  · Will get help right away if you are not doing well or get worse.  Document Released: 10/01/2004 Document Revised: 11/16/2011 Document Reviewed: 08/04/2011  ExitCare® Patient Information ©2015 [employer]. This information is not intended to replace advice given to you by your health care provider. Make sure you discuss any questions   you have with your health care provider.

## 2015-03-28 NOTE — Progress Notes (Signed)
   Subjective:    Patient ID: Gabrielle Gallegos, female    DOB: Dec 11, 1955, 59 y.o.   MRN: [id number]  HPI Patient in c/o knots on her shins- noticed them about 2 weeks ago- had one lower and it went away and now has one higher up. Slight soreness.  Tachycardia today- taking coreg 3.125- has follow up appointment with cardiologist in 1 month  Review of Systems  Constitutional: Negative.   HENT: Negative.   Respiratory: Negative.   Cardiovascular: Negative.   Genitourinary: Negative.   Neurological: Negative.   Psychiatric/Behavioral: Negative.   All other systems reviewed and are negative.      Objective:   Physical Exam  Constitutional: She is oriented to person, place, and time. She appears well-developed and well-nourished.  Cardiovascular: Normal rate, regular rhythm and normal heart sounds.   Pulmonary/Chest: Effort normal and breath sounds normal.  Neurological: She is alert and oriented to person, place, and time.  Skin: Skin is warm and dry.  Soft tissue nodule right lower shin - mildly tender to touch- no erythema no drainage  Psychiatric: She has a normal mood and affect. Her behavior is normal. Judgment and thought content normal.    BP 144/84 mmHg  Pulse 141  Temp(Src) 96.9 F (36.1 C) (Oral)  Ht '5\' 3"'$  (1.6 m)  Wt 144 lb (65.318 kg)  BMI 25.51 kg/m2       Assessment & Plan:  1. Tachycardia, paroxysmal Avoid caffeine folow up with cardiologist - carvedilol (COREG) 6.25 MG tablet; Take 1 tablet (6.25 mg total) by mouth 2 (two) times daily with a meal.  Dispense: 60 tablet; Refill: 3  2. Nodule of soft tissue Watch area RTO prn  Mary-Margaret Hassell Done, FNP

## 2015-04-07 ENCOUNTER — Other Ambulatory Visit: Payer: Self-pay | Admitting: Nurse Practitioner

## 2015-04-23 ENCOUNTER — Other Ambulatory Visit: Payer: Self-pay | Admitting: Family Medicine

## 2015-04-23 ENCOUNTER — Ambulatory Visit (INDEPENDENT_AMBULATORY_CARE_PROVIDER_SITE_OTHER): Payer: Commercial Indemnity | Admitting: Nurse Practitioner

## 2015-04-23 ENCOUNTER — Encounter: Payer: Self-pay | Admitting: Nurse Practitioner

## 2015-04-23 ENCOUNTER — Telehealth: Payer: Self-pay | Admitting: Nurse Practitioner

## 2015-04-23 VITALS — BP 134/85 | HR 123 | Temp 97.7°F | Ht 63.0 in | Wt 144.0 lb

## 2015-04-23 DIAGNOSIS — M7989 Other specified soft tissue disorders: Secondary | ICD-10-CM

## 2015-04-23 DIAGNOSIS — I479 Paroxysmal tachycardia, unspecified: Secondary | ICD-10-CM

## 2015-04-23 MED ORDER — CARVEDILOL 6.25 MG PO TABS: 6.2500 mg | ORAL_TABLET | Freq: Two times a day (BID) | ORAL | Status: DC

## 2015-04-23 NOTE — Telephone Encounter (Signed)
Patient aware and appointment scheduled.  

## 2015-04-23 NOTE — Progress Notes (Signed)
   Subjective:    Patient ID: Gabrielle Gallegos, female    DOB: 03-Mar-1956, 59 y.o.   MRN: [id number]  HPI Patient was seen 03/28/15 with soft tissue nodule on right shin- patient thinks that it is getting bigger and wants it looked at again.    Review of Systems  Constitutional: Negative.   HENT: Negative.   Respiratory: Negative.   Cardiovascular: Negative.   Genitourinary: Negative.   Neurological: Negative.   Psychiatric/Behavioral: Negative.   All other systems reviewed and are negative.      Objective:   Physical Exam  Constitutional: She is oriented to person, place, and time. She appears well-developed and well-nourished.  Cardiovascular: Normal rate, regular rhythm and normal heart sounds.   Pulmonary/Chest: Effort normal.  Neurological: She is alert and oriented to person, place, and time.  Skin: Skin is warm.  3cm hard tender nodule on right shin and similar 1 cm area on left lower shin  Psychiatric: She has a normal mood and affect. Her behavior is normal. Judgment and thought content normal.    BP 134/85 mmHg  Pulse 123  Temp(Src) 97.7 F (36.5 C) (Oral)  Ht '5\' 3"'$  (1.6 m)  Wt 144 lb (65.318 kg)  BMI 25.51 kg/m2       Assessment & Plan:  1. Nodule of soft tissue Just watch areas until see surgeon - Ambulatory referral to [place], [place]

## 2015-04-23 NOTE — Telephone Encounter (Signed)
cardevidolol I sent the Rx to the pharmacy. ntbs if knot is increasing in size

## 2015-04-25 ENCOUNTER — Ambulatory Visit: Payer: Commercial Indemnity | Admitting: Nurse Practitioner

## 2015-04-28 ENCOUNTER — Other Ambulatory Visit: Payer: Self-pay | Admitting: Physician Assistant

## 2015-04-29 ENCOUNTER — Ambulatory Visit: Payer: Self-pay | Admitting: Nurse Practitioner

## 2015-04-29 ENCOUNTER — Telehealth: Payer: Self-pay | Admitting: Nurse Practitioner

## 2015-05-14 ENCOUNTER — Other Ambulatory Visit: Payer: Self-pay | Admitting: Surgery

## 2015-05-24 ENCOUNTER — Other Ambulatory Visit: Payer: Self-pay | Admitting: Nurse Practitioner

## 2015-05-24 NOTE — Telephone Encounter (Signed)
Needs to be seen and symptoms reassessed before any further refills, changed to 30 tabs with no refills. Use only if needed.

## 2015-05-24 NOTE — Telephone Encounter (Signed)
Gabrielle Gallegos pt, last seen 03/28/15. Last filled 04/23/15. Route to pool for nurse to call in

## 2015-06-05 ENCOUNTER — Other Ambulatory Visit: Payer: Self-pay | Admitting: Nurse Practitioner

## 2015-06-10 ENCOUNTER — Telehealth: Payer: Self-pay | Admitting: Nurse Practitioner

## 2015-06-10 MED ORDER — CLONAZEPAM 0.5 MG PO TABS: 0.5000 mg | ORAL_TABLET | Freq: Every day | ORAL | Status: DC

## 2015-06-10 NOTE — Telephone Encounter (Signed)
Called to [facility]. Patient notified 

## 2015-06-10 NOTE — Telephone Encounter (Signed)
Please call in klonopin 0.5 1 po QHS #[phone]  with 0 refills

## 2015-06-25 ENCOUNTER — Encounter (INDEPENDENT_AMBULATORY_CARE_PROVIDER_SITE_OTHER): Payer: Commercial Indemnity | Admitting: Family Medicine

## 2015-06-25 DIAGNOSIS — R319 Hematuria, unspecified: Secondary | ICD-10-CM | POA: Diagnosis not present

## 2015-06-25 LAB — POCT URINALYSIS DIPSTICK
Bilirubin, UA: NEGATIVE
Glucose, UA: NEGATIVE
Ketones, UA: NEGATIVE
Nitrite, UA: NEGATIVE
Protein, UA: NEGATIVE
Spec Grav, UA: >=1.03
Urobilinogen, UA: NEGATIVE
pH, UA: 6

## 2015-06-25 LAB — POCT UA - MICROSCOPIC ONLY
Casts, Ur, LPF, POC: NEGATIVE
Crystals, Ur, HPF, POC: NEGATIVE
Mucus, UA: NEGATIVE

## 2015-06-26 ENCOUNTER — Encounter: Payer: Self-pay | Admitting: Family Medicine

## 2015-06-26 ENCOUNTER — Ambulatory Visit (INDEPENDENT_AMBULATORY_CARE_PROVIDER_SITE_OTHER): Payer: Commercial Indemnity | Admitting: Family Medicine

## 2015-06-26 VITALS — BP 131/76 | HR 96 | Temp 97.5°F | Ht 63.0 in | Wt 142.0 lb

## 2015-06-26 DIAGNOSIS — N200 Calculus of kidney: Secondary | ICD-10-CM

## 2015-06-26 DIAGNOSIS — I8390 Asymptomatic varicose veins of unspecified lower extremity: Secondary | ICD-10-CM

## 2015-06-26 DIAGNOSIS — I868 Varicose veins of other specified sites: Secondary | ICD-10-CM

## 2015-06-26 MED ORDER — TAMSULOSIN HCL 0.4 MG PO CAPS: 0.4000 mg | ORAL_CAPSULE | Freq: Every day | ORAL | Status: DC

## 2015-06-26 MED ORDER — HYDROCODONE-ACETAMINOPHEN 10-325 MG PO TABS: 1.0000 | ORAL_TABLET | Freq: Four times a day (QID) | ORAL | Status: DC | PRN

## 2015-06-26 NOTE — Patient Instructions (Signed)
Dietary Guidelines to Help Prevent Kidney Stones Your risk of kidney stones can be decreased by adjusting the foods you eat. The most important thing you can do is drink enough fluid. You should drink enough fluid to keep your urine clear or pale yellow. The following guidelines provide specific information for the type of kidney stone you have had. GUIDELINES ACCORDING TO TYPE OF KIDNEY STONE Calcium Oxalate Kidney Stones  Reduce the amount of salt you eat. Foods that have a lot of salt cause your body to release excess calcium into your urine. The excess calcium can combine with a substance called oxalate to form kidney stones.  Reduce the amount of animal protein you eat if the amount you eat is excessive. Animal protein causes your body to release excess calcium into your urine. Ask your dietitian how much protein from animal sources you should be eating.  Avoid foods that are high in oxalates. If you take vitamins, they should have less than 500 mg of vitamin C. Your body turns vitamin C into oxalates. You do not need to avoid fruits and vegetables high in vitamin C. Calcium Phosphate Kidney Stones  Reduce the amount of salt you eat to help prevent the release of excess calcium into your urine.  Reduce the amount of animal protein you eat if the amount you eat is excessive. Animal protein causes your body to release excess calcium into your urine. Ask your dietitian how much protein from animal sources you should be eating.  Get enough calcium from food or take a calcium supplement (ask your dietitian for recommendations). Food sources of calcium that do not increase your risk of kidney stones include:  Broccoli.  Dairy products, such as cheese and yogurt.  Pudding. Uric Acid Kidney Stones  Do not have more than 6 oz of animal protein per day. FOOD SOURCES Animal Protein Sources  Meat (all types).  Poultry.  Eggs.  Fish, seafood. Foods High in Salt  Salt seasonings.  Soy  sauce.  Teriyaki sauce.  Cured and processed meats.  Salted crackers and snack foods.  Fast food.  Canned soups and most canned foods. Foods High in Oxalates  Grains:  Amaranth.  Barley.  Grits.  Wheat germ.  Bran.  Buckwheat flour.  All bran cereals.  Pretzels.  Whole wheat bread.  Vegetables:  Beans (wax).  Beets and beet greens.  Collard greens.  Eggplant.  Escarole.  Leeks.  Okra.  Parsley.  Rutabagas.  Spinach.  Swiss chard.  Tomato paste.  Fried potatoes.  Sweet potatoes.  Fruits:  Red currants.  Figs.  Kiwi.  Rhubarb.  Meat and Other Protein Sources:  Beans (dried).  Soy burgers and other soybean products.  Miso.  Nuts (peanuts, almonds, pecans, cashews, hazelnuts).  Nut butters.  Sesame seeds and tahini (paste made of sesame seeds).  Poppy seeds.  Beverages:  Chocolate drink mixes.  Soy milk.  Instant iced tea.  Juices made from high-oxalate fruits or vegetables.  Other:  Carob.  Chocolate.  Fruitcake.  Marmalades.   This information is not intended to replace advice given to you by your health care provider. Make sure you discuss any questions you have with your health care provider.   Document Released: 12/19/2010 Document Revised: 08/29/2013 Document Reviewed: 07/21/2013 Elsevier Interactive Patient Education 2016 [employer] Inc.  

## 2015-06-26 NOTE — Progress Notes (Signed)
BP 131/76 mmHg  Pulse 96  Temp(Src) 97.5 F (36.4 C) (Oral)  Ht '5\' 3"'$  (1.6 m)  Wt 142 lb (64.411 kg)  BMI 25.16 kg/m2   Subjective:    Patient ID: Gabrielle Gallegos, female    DOB: September 25, 1955, 59 y.o.   MRN: [id number]  HPI: Gabrielle Gallegos is a 59 y.o. female presenting on 06/26/2015 for Hematuria and Knots on legs   HPI Abdominal pain and bloody urine Patient presents today with abdominal pain and flank pain worse on the left than the right but she is having on both sides. She is also having suprapubic abdominal pain and bloody urine. This all started yesterday. She has had previous bouts of kidney stones and most recently had an x-ray 2 months ago that showed that she did have a kidney stone in the renal pole. At that time the abdominal x-ray showed about a 4 mm stone. She denies any fevers or chills. The pain radiates from the front into her flank. This is very similar to her pain that she's had previously. She describes it as a 10 out of 10 and worse labor pain. She is tempted to start drinking more fluids but is not always good that.  Leg bumps She has 3 small spots one on the right lower leg and 2 on the left lower leg that have presented as small bumps. they have come and gone and are sometimes slightly tender.  Relevant past medical, surgical, family and social history reviewed and updated as indicated. Interim medical history since our last visit reviewed. Allergies and medications reviewed and updated.  Review of Systems  Constitutional: Negative for fever and chills.  HENT: Negative for congestion, ear discharge and ear pain.   Eyes: Negative for redness and visual disturbance.  Respiratory: Negative for chest tightness and shortness of breath.   Cardiovascular: Negative for chest pain, palpitations and leg swelling.  Genitourinary: Positive for dysuria, frequency, hematuria, flank pain and decreased urine volume. Negative for difficulty urinating.  Musculoskeletal:  Negative for back pain and gait problem.  Skin: Positive for wound. Negative for rash.  Neurological: Negative for light-headedness and headaches.  Psychiatric/Behavioral: Negative for behavioral problems and agitation.  All other systems reviewed and are negative.   Per HPI unless specifically indicated above     Medication List       This list is accurate as of: 06/26/15 10:21 AM.  Always use your most recent med list.               atorvastatin 40 MG tablet  Commonly known as:  LIPITOR  Take 1 tablet (40 mg total) by mouth daily.     carvedilol 6.25 MG tablet  Commonly known as:  COREG  Take 1 tablet (6.25 mg total) by mouth 2 (two) times daily with a meal.     clonazePAM 0.5 MG tablet  Commonly known as:  KLONOPIN  Take 1 tablet (0.5 mg total) by mouth at bedtime.     gabapentin 300 MG capsule  Commonly known as:  NEURONTIN  TAKE ONE CAPSULE BY MOUTH THREE TIMES DAILY     HYDROcodone-acetaminophen 10-325 MG tablet  Commonly known as:  NORCO  Take 1 tablet by mouth every 6 (six) hours as needed.     meloxicam 15 MG tablet  Commonly known as:  MOBIC  TAKE ONE TABLET BY MOUTH ONCE DAILY     sertraline 100 MG tablet  Commonly known as:  ZOLOFT  Take 1  tablet (100 mg total) by mouth daily.     tamsulosin 0.4 MG Caps capsule  Commonly known as:  FLOMAX  Take 1 capsule (0.4 mg total) by mouth daily.     Vitamin D (Ergocalciferol) 50000 UNITS Caps capsule  Commonly known as:  DRISDOL  Take 1 capsule twice a WEEK           Objective:    BP 131/76 mmHg  Pulse 96  Temp(Src) 97.5 F (36.4 C) (Oral)  Ht '5\' 3"'$  (1.6 m)  Wt 142 lb (64.411 kg)  BMI 25.16 kg/m2  Wt Readings from Last 3 Encounters:  06/26/15 142 lb (64.411 kg)  04/23/15 144 lb (65.318 kg)  03/28/15 144 lb (65.318 kg)    Physical Exam  Constitutional: She is oriented to person, place, and time. She appears well-developed and well-nourished. No distress.  Eyes: Conjunctivae and EOM are  normal. Pupils are equal, round, and reactive to light.  Cardiovascular: Normal rate, regular rhythm, normal heart sounds and intact distal pulses.   No murmur heard. Pulmonary/Chest: Effort normal and breath sounds normal. No respiratory distress. She has no wheezes.  Abdominal: Normal appearance and bowel sounds are normal. There is no hepatosplenomegaly. There is tenderness in the suprapubic area. There is CVA tenderness (Bilateral worse on left). There is no rigidity, no guarding and negative Murphy's sign.  Musculoskeletal: Normal range of motion. She exhibits no edema or tenderness.  Neurological: She is alert and oriented to person, place, and time. Coordination normal.  Skin: Skin is warm and dry. Lesion (3 small spots consistent with varicose veins) noted. No rash noted. She is not diaphoretic.  Psychiatric: She has a normal mood and affect. Her behavior is normal.  Vitals reviewed.   Results for orders placed or performed in visit on 06/25/15  POCT urinalysis dipstick  Result Value Ref Range   Color, UA brown    Clarity, UA cloudy    Glucose, UA neg    Bilirubin, UA neg    Ketones, UA neg    Spec Grav, UA >=1.030    Blood, UA large    pH, UA 6.0    Protein, UA neg    Urobilinogen, UA negative    Nitrite, UA neg    Leukocytes, UA Trace (A) Negative  POCT UA - Microscopic Only  Result Value Ref Range   WBC, Ur, HPF, POC 10-15    RBC, urine, microscopic 80-150    Bacteria, U Microscopic occ    Mucus, UA neg    Epithelial cells, urine per micros occ    Crystals, Ur, HPF, POC neg    Casts, Ur, LPF, POC neg    Yeast, UA mod       Assessment & Plan:   Problem List Items Addressed This Visit    None    Visit Diagnoses    Kidney stones, calcium oxalate    -  Primary    She's had them before and never occurred before. We'll treat as kidney stones based on UA and get CT scan and give Flomax    Relevant Medications    HYDROcodone-acetaminophen (NORCO) 10-325 MG tablet      Other Relevant Orders    CT Abdomen Pelvis Wo Contrast    Varicose veins        Small bilateral varicose veins, will watch and observe        Follow up plan: Return if symptoms worsen or fail to improve.  Caryl Pina, MD [facility] 06/26/2015, 10:21 AM

## 2015-06-27 LAB — URINE CULTURE

## 2015-06-27 NOTE — Progress Notes (Signed)
This encounter was created in error - please disregard.

## 2015-07-03 ENCOUNTER — Ambulatory Visit (HOSPITAL_COMMUNITY): Payer: Managed Care, Other (non HMO)

## 2015-07-03 ENCOUNTER — Telehealth: Payer: Self-pay

## 2015-07-03 DIAGNOSIS — Z87442 Personal history of urinary calculi: Secondary | ICD-10-CM

## 2015-07-03 DIAGNOSIS — R319 Hematuria, unspecified: Secondary | ICD-10-CM

## 2015-07-03 NOTE — Telephone Encounter (Signed)
Ok to do referral, but likely they will want her to do CT anyways. Caryl Pina, MD [facility] 07/03/2015, 5:31 PM

## 2015-07-03 NOTE — Telephone Encounter (Signed)
Patient doesn't want CT  Wants to go right to [facility]  Because of cost

## 2015-07-03 NOTE — Telephone Encounter (Signed)
Referral to urologist sent, patient informed

## 2015-07-08 IMAGING — CR DG CHEST 2V
2 series · 2 of 2 positions shown · non-contrast
Comparison: Chest x-ray of 07/11/2013

CLINICAL DATA: Cough, cold for several weeks, smoking history

EXAM:
CHEST  2 VIEW

[view not recorded (1 of 2)]
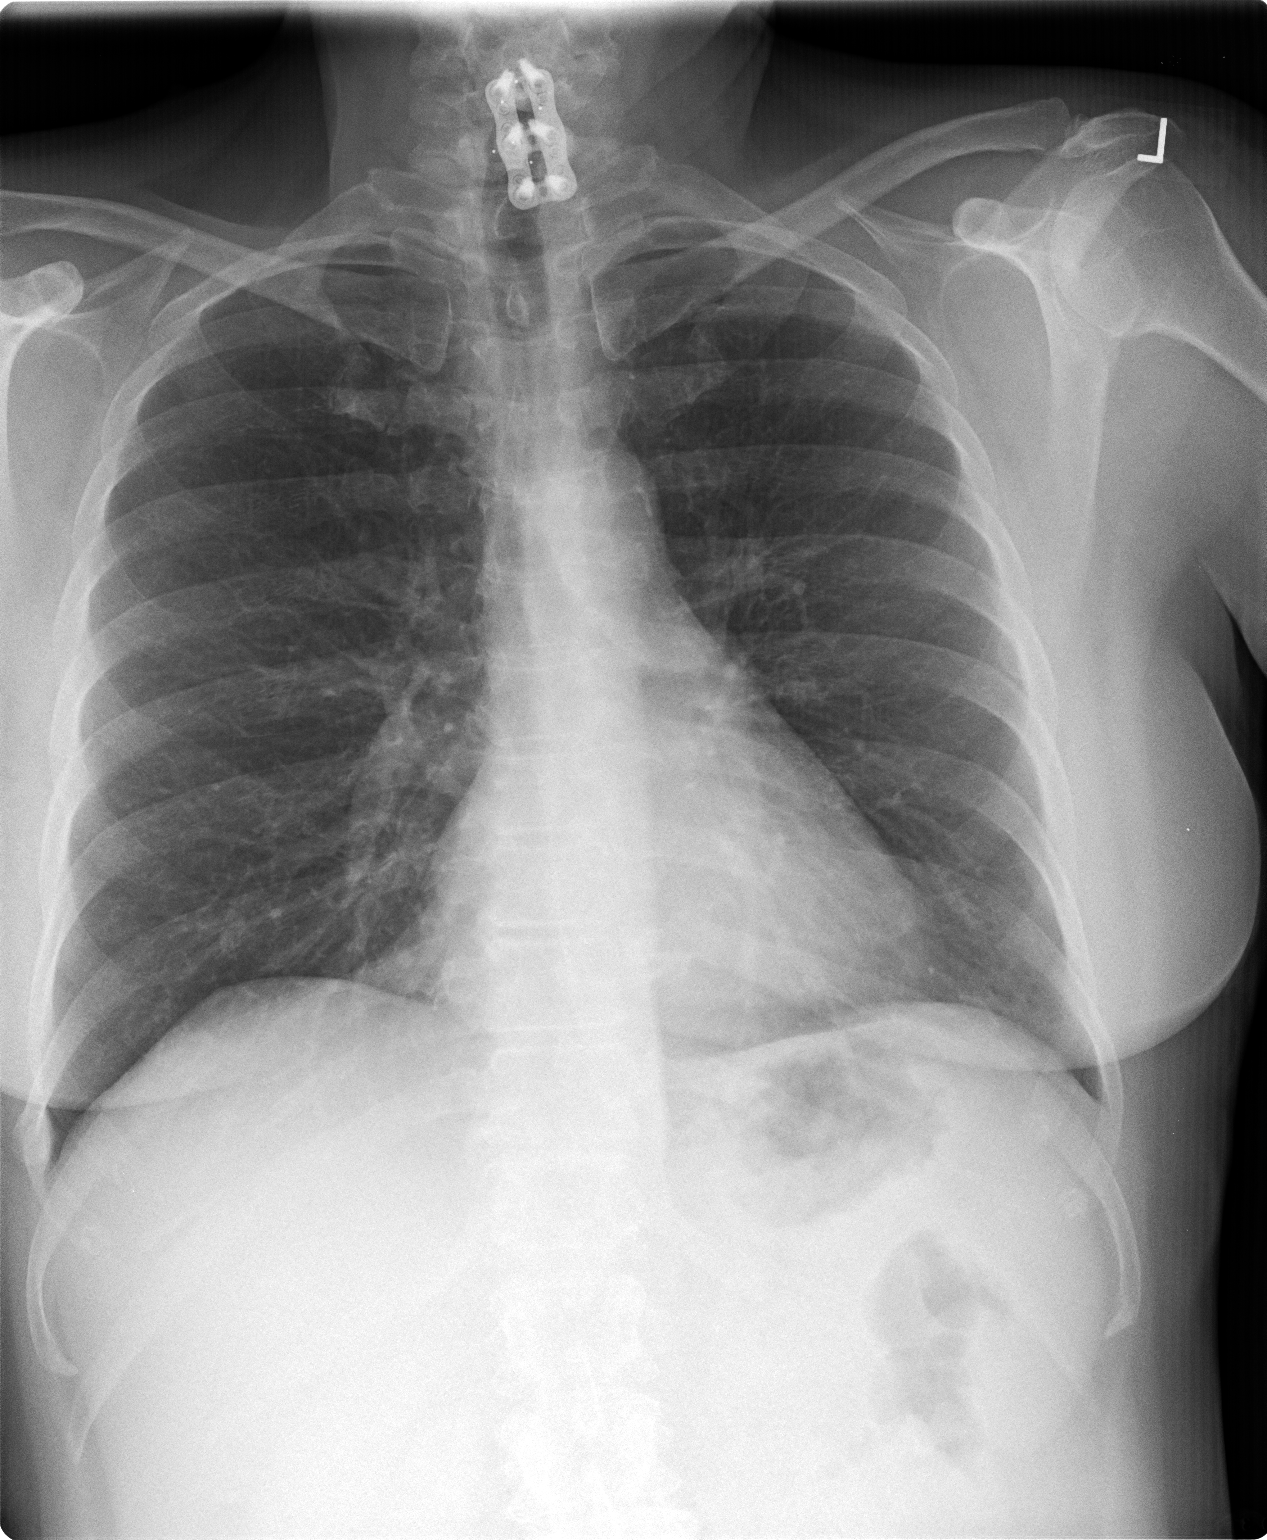

[view not recorded (2 of 2)]
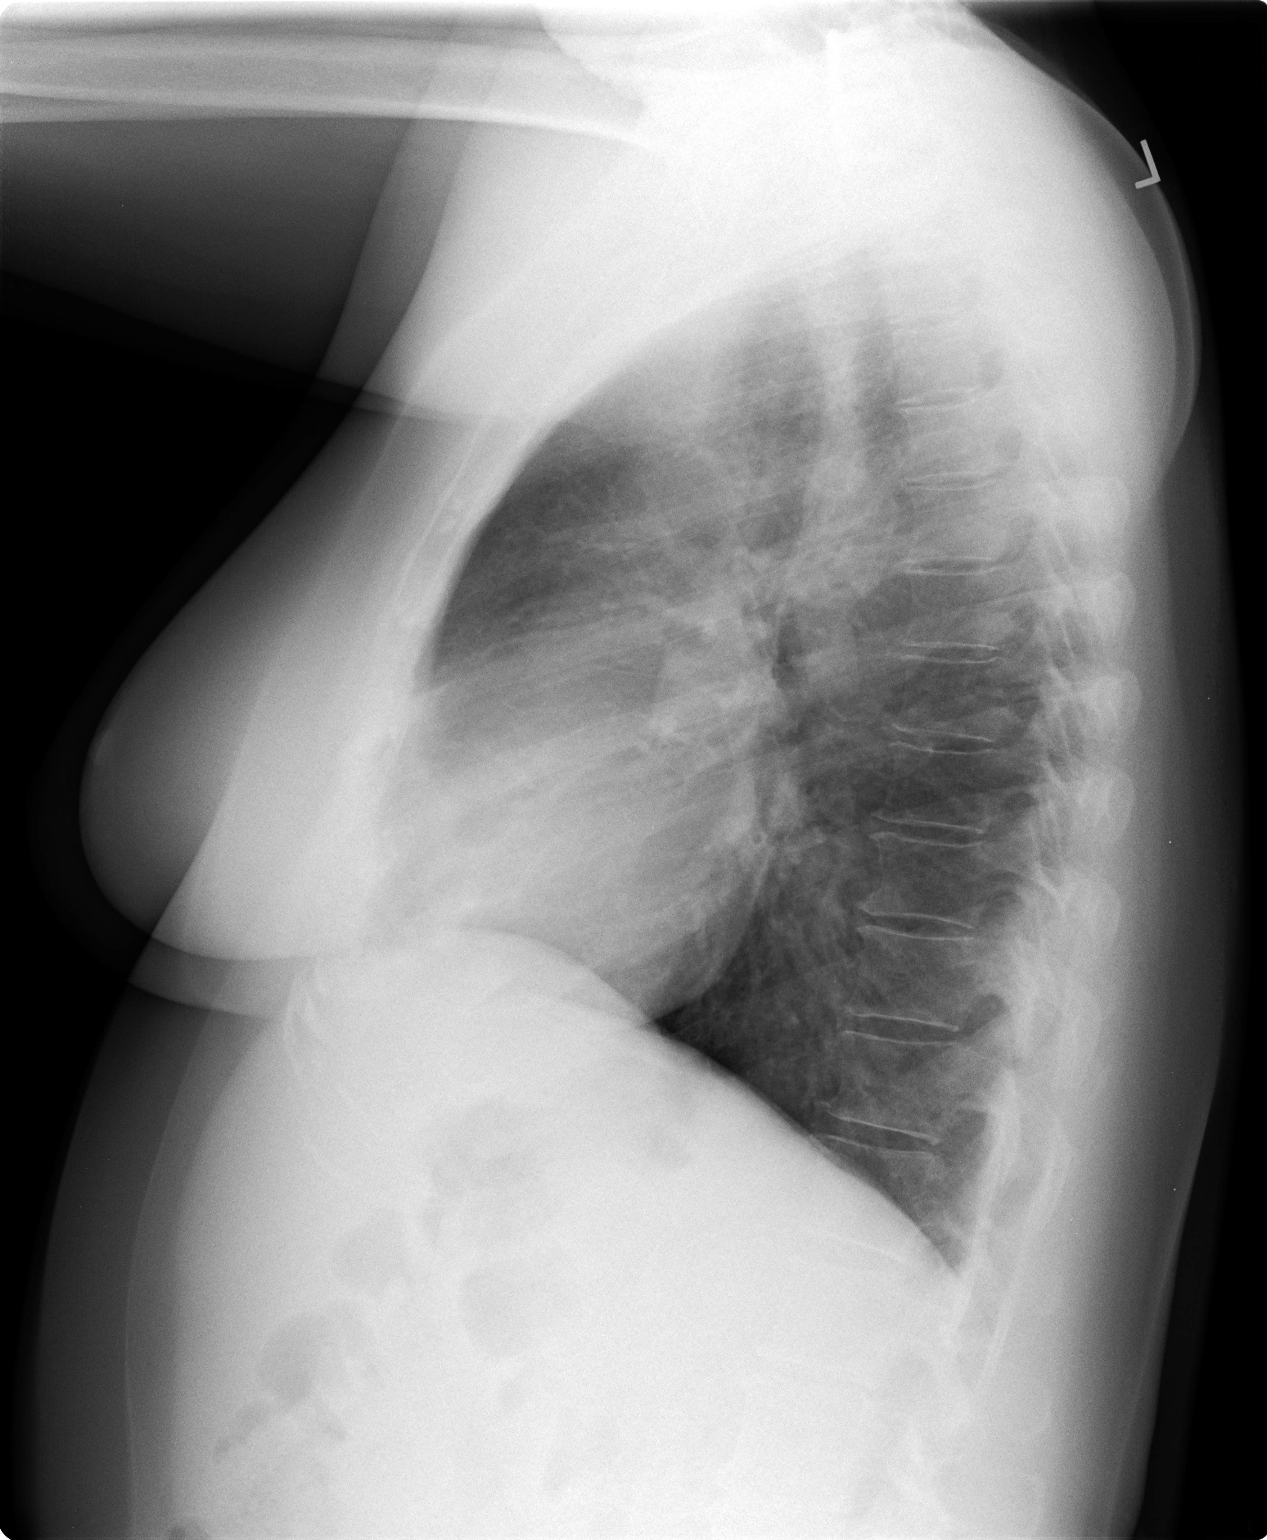

[2 of 2 positions shown; findings below may reference images not displayed]

FINDINGS: No active infiltrate or effusion is seen. There is some
peribronchial thickening which may indicate bronchitis. The
mediastinal and hilar contours are unremarkable. The heart is within
normal limits in size. No bony abnormality is seen.
IMPRESSION: No pneumonia.  Possible bronchitis.

## 2015-07-09 ENCOUNTER — Other Ambulatory Visit: Payer: Self-pay | Admitting: Pharmacist

## 2015-07-09 ENCOUNTER — Other Ambulatory Visit: Payer: Self-pay | Admitting: Nurse Practitioner

## 2015-07-10 ENCOUNTER — Other Ambulatory Visit: Payer: Self-pay | Admitting: Nurse Practitioner

## 2015-07-11 ENCOUNTER — Ambulatory Visit (HOSPITAL_COMMUNITY): Payer: Managed Care, Other (non HMO)

## 2015-07-11 NOTE — Telephone Encounter (Signed)
I have not seen her to discuss anxiety or depression. I will saw her for an acute visit. If she is going to see me for this and I need to see her in person if not this may be filled by Shelah Lewandowsky who has seen her for this issue, I would be more than happy to see her for this issue if she would like me to start taking an over. Caryl Pina, MD [facility] 07/11/2015, 1:10 PM

## 2015-07-11 NOTE — Telephone Encounter (Signed)
Last seen 06/26/15  Dr Dettinger   If approved route to nurse to call into [facility]

## 2015-08-26 ENCOUNTER — Encounter: Payer: Commercial Indemnity | Admitting: Family Medicine

## 2015-08-26 IMAGING — CR DG ABDOMEN 1V
1 series · 1 of 1 positions shown · non-contrast
Comparison: 10/25/2014 and CT of 07/04/2013.

CLINICAL DATA: Abdominal pain and history of kidney stones.

EXAM:
ABDOMEN - 1 VIEW

[view not recorded]
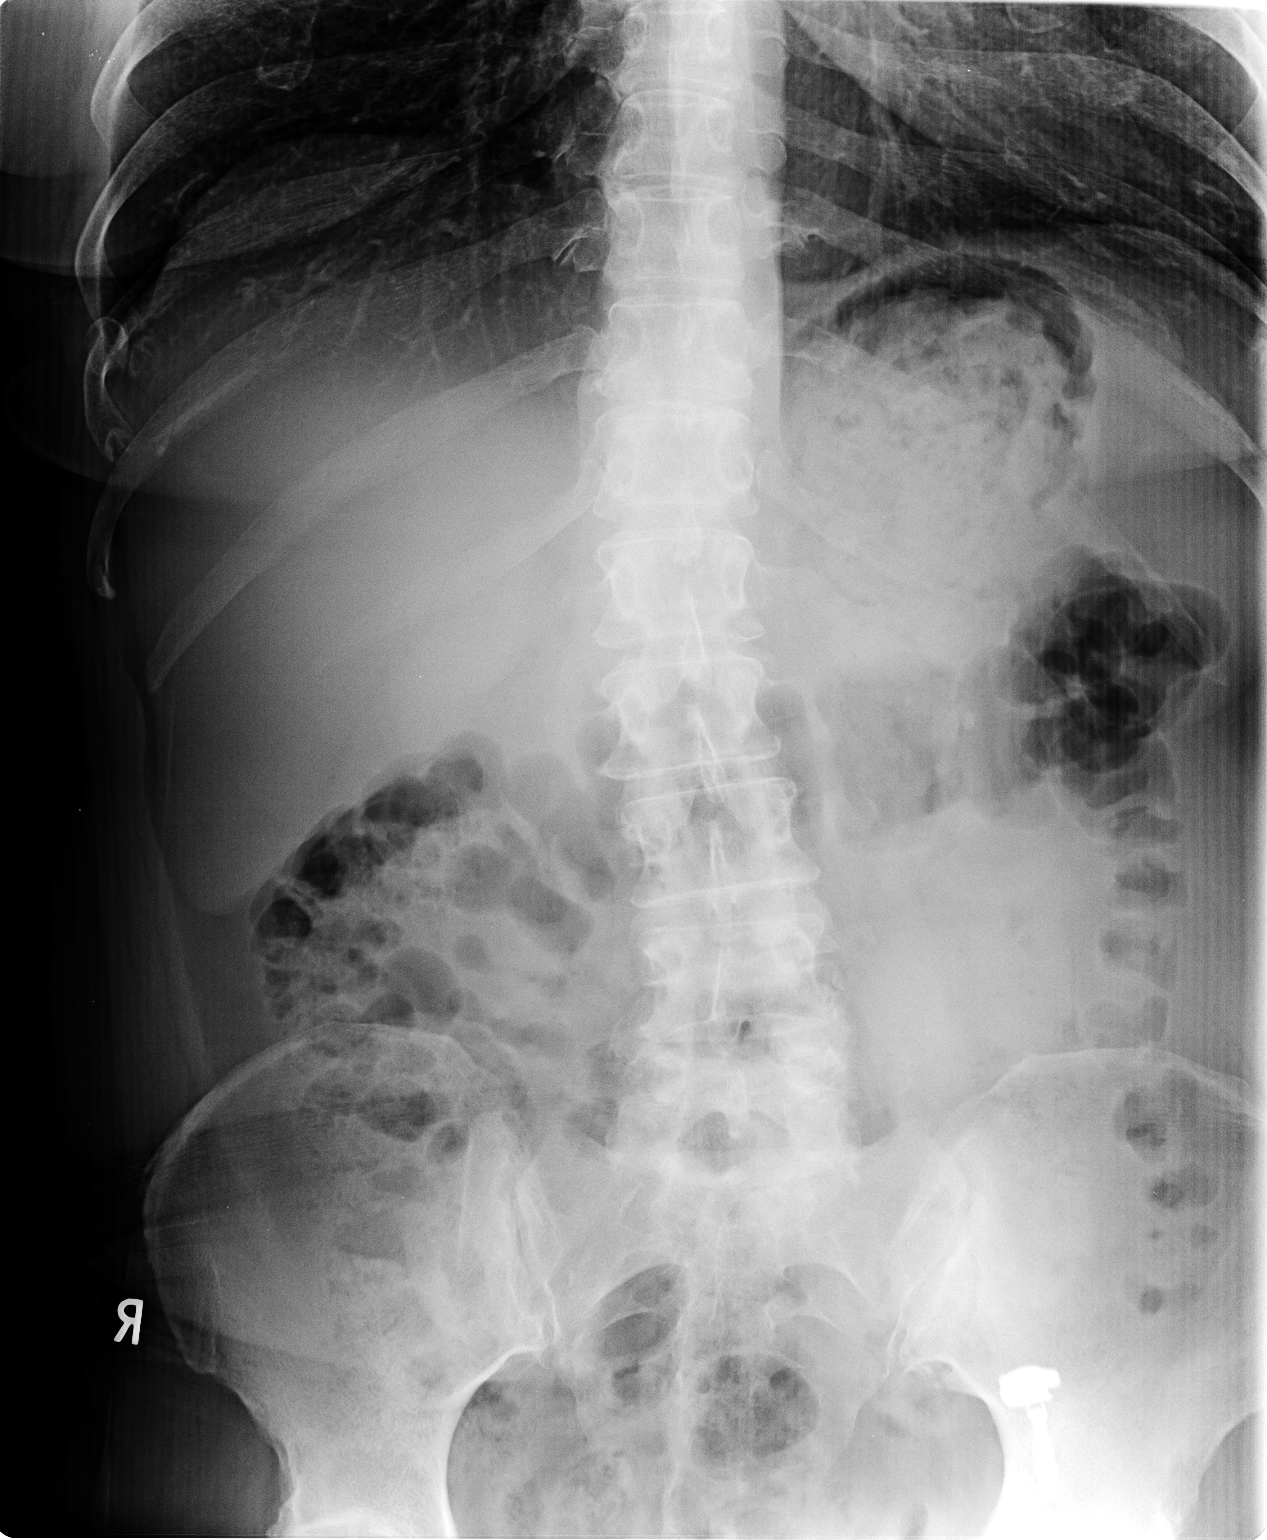

[1 of 1 positions shown; findings below may reference images not displayed]

FINDINGS: Single presumed supine view of the abdomen and pelvis. Minimal
S-shaped lumbar spine curvature. Non-obstructive bowel gas pattern.
Calcification measures 4 mm and projects over the interpolar left
kidney. Low pelvis excluded. Large amount of stool and gas project
over the kidneys bilaterally.
IMPRESSION: Probable left renal calculus. No bowel obstruction or other
explanation for abdominal pain.

## 2015-08-29 ENCOUNTER — Ambulatory Visit (INDEPENDENT_AMBULATORY_CARE_PROVIDER_SITE_OTHER): Payer: Managed Care, Other (non HMO) | Admitting: Family Medicine

## 2015-08-29 ENCOUNTER — Encounter: Payer: Self-pay | Admitting: Family Medicine

## 2015-08-29 VITALS — BP 121/81 | HR 94 | Temp 97.5°F | Ht 63.0 in | Wt 145.8 lb

## 2015-08-29 DIAGNOSIS — I479 Paroxysmal tachycardia, unspecified: Secondary | ICD-10-CM

## 2015-08-29 DIAGNOSIS — F329 Major depressive disorder, single episode, unspecified: Secondary | ICD-10-CM

## 2015-08-29 DIAGNOSIS — Z23 Encounter for immunization: Secondary | ICD-10-CM | POA: Diagnosis not present

## 2015-08-29 DIAGNOSIS — F32A Depression, unspecified: Secondary | ICD-10-CM

## 2015-08-29 DIAGNOSIS — E785 Hyperlipidemia, unspecified: Secondary | ICD-10-CM | POA: Diagnosis not present

## 2015-08-29 DIAGNOSIS — Z0000 Encounter for general adult medical examination without abnormal findings: Secondary | ICD-10-CM | POA: Diagnosis not present

## 2015-08-29 DIAGNOSIS — Z1211 Encounter for screening for malignant neoplasm of colon: Secondary | ICD-10-CM

## 2015-08-29 DIAGNOSIS — G629 Polyneuropathy, unspecified: Secondary | ICD-10-CM | POA: Diagnosis not present

## 2015-08-29 MED ORDER — PRAMIPEXOLE DIHYDROCHLORIDE 0.5 MG PO TABS: ORAL_TABLET | ORAL | Status: DC

## 2015-08-29 MED ORDER — GABAPENTIN 300 MG PO CAPS: 300.0000 mg | ORAL_CAPSULE | Freq: Three times a day (TID) | ORAL | Status: DC

## 2015-08-29 MED ORDER — ATORVASTATIN CALCIUM 40 MG PO TABS: 40.0000 mg | ORAL_TABLET | Freq: Every day | ORAL | Status: DC

## 2015-08-29 MED ORDER — VENLAFAXINE HCL ER 37.5 MG PO CP24: 37.5000 mg | ORAL_CAPSULE | Freq: Every day | ORAL | Status: DC

## 2015-08-29 MED ORDER — CARVEDILOL 6.25 MG PO TABS: 6.2500 mg | ORAL_TABLET | Freq: Two times a day (BID) | ORAL | Status: DC

## 2015-08-29 MED ORDER — VENLAFAXINE HCL ER 75 MG PO CP24: 75.0000 mg | ORAL_CAPSULE | Freq: Every day | ORAL | Status: DC

## 2015-08-29 NOTE — Progress Notes (Signed)
BP 121/81 mmHg  Pulse 94  Temp(Src) 97.5 F (36.4 C) (Oral)  Ht _0  (1.6 m)  Wt 145 lb 12.8 oz (66.134 kg)  BMI 25.83 kg/m2   Subjective:    Patient ID: Gabrielle Gallegos, female    DOB: 03-13-1956, 59 y.o.   MRN: [id number]  HPI: Gabrielle Gallegos is a 59 y.o. female presenting on 08/29/2015 for Annual Exam   HPI Adult well exam Patient is presenting today for her well exam and for screening labs and recheck of other labs.  Depression Patient has anxiety and depression and has been on Zoloft 100 mg and fills like it is just not working for her as well anymore. She is having a lot more issues with sleep and anxiety attack episodes and crying episodes and just generally not doing as well. She would like to try something different or something new. She denies any suicidal ideation or thoughts of hurting herself currently.  Tachycardia Per her she was diagnosed with some kind of paroxysmal tachycardia/palpitations that are intermittent, she needs refill on her Coreg for this issue. She has been stable on the Coreg and feels like it is doing very well for. Patient denies headaches, blurred vision, chest pains, shortness of breath, or weakness. Denies any side effects from medication and is content with current medication. She is not currently having any tachycardia because it has been controlled on the Coreg.  Burning pain in her feet and legs Patient says she has been having burning pain in her feet and legs for quite some time. She gives a description of going to neurology and having extensive nerve testing and then confirming that the damage was there but not finding a cause. She does admit that she has chronic low back pain and has had that looked at by orthopedics before. She has been on Neurontin previously and will like to go back on Neurontin. She has some vague numbness on her lateral feet bilaterally. She denies any weakness or other loss of sensation.  Hyperlipidemia  Patient is  currently on atorvastatin for her cholesterol. She denies any muscle aches or cramping from this. She is due for cholesterol recheck. She denies any chest pain or shortness of breath or focal numbness or weakness.  Relevant past medical, surgical, family and social history reviewed and updated as indicated. Interim medical history since our last visit reviewed. Allergies and medications reviewed and updated.  Review of Systems  Constitutional: Negative for fever and chills.  HENT: Negative for congestion, ear discharge and ear pain.   Eyes: Negative for redness and visual disturbance.  Respiratory: Negative for chest tightness and shortness of breath.   Cardiovascular: Negative for chest pain and leg swelling.  Genitourinary: Negative for dysuria and difficulty urinating.  Musculoskeletal: Negative for back pain and gait problem.  Skin: Negative for rash.  Neurological: Positive for numbness. Negative for syncope, speech difficulty, weakness, light-headedness and headaches.  Psychiatric/Behavioral: Positive for sleep disturbance, dysphoric mood and decreased concentration. Negative for suicidal ideas, behavioral problems, self-injury and agitation. The patient is nervous/anxious.   All other systems reviewed and are negative.   Per HPI unless specifically indicated above     Medication List       This list is accurate as of: 08/29/15 11:31 AM.  Always use your most recent med list.               atorvastatin 40 MG tablet  Commonly known as:  LIPITOR  Take 1  tablet (40 mg total) by mouth daily.     carvedilol 6.25 MG tablet  Commonly known as:  COREG  Take 1 tablet (6.25 mg total) by mouth 2 (two) times daily with a meal.     clonazePAM 0.5 MG tablet  Commonly known as:  KLONOPIN  Take 1 tablet (0.5 mg total) by mouth at bedtime.     gabapentin 300 MG capsule  Commonly known as:  NEURONTIN  Take 1 capsule (300 mg total) by mouth 3 (three) times daily.     meloxicam 15  MG tablet  Commonly known as:  MOBIC  TAKE ONE TABLET BY MOUTH ONCE DAILY     pramipexole 0.5 MG tablet  Commonly known as:  MIRAPEX  TAKE ONE TABLET BY MOUTH AT BEDTIME MAY REPEAT DOSE ONE TIME IF NEEDED     sertraline 100 MG tablet  Commonly known as:  ZOLOFT  Take 1 tablet (100 mg total) by mouth daily.     tamsulosin 0.4 MG Caps capsule  Commonly known as:  FLOMAX  Take 1 capsule (0.4 mg total) by mouth daily.     venlafaxine XR 37.5 MG 24 hr capsule  Commonly known as:  EFFEXOR XR  Take 1 capsule (37.5 mg total) by mouth daily with breakfast.     venlafaxine XR 75 MG 24 hr capsule  Commonly known as:  EFFEXOR XR  Take 1 capsule (75 mg total) by mouth daily with breakfast.     Vitamin D (Ergocalciferol) 50000 UNITS Caps capsule  Commonly known as:  DRISDOL  Take 1 capsule twice a WEEK           Objective:    BP 121/81 mmHg  Pulse 94  Temp(Src) 97.5 F (36.4 C) (Oral)  Ht _0  (1.6 m)  Wt 145 lb 12.8 oz (66.134 kg)  BMI 25.83 kg/m2  Wt Readings from Last 3 Encounters:  08/29/15 145 lb 12.8 oz (66.134 kg)  06/26/15 142 lb (64.411 kg)  04/23/15 144 lb (65.318 kg)    Physical Exam  Constitutional: She is oriented to person, place, and time. She appears well-developed and well-nourished. No distress.  Eyes: Conjunctivae and EOM are normal. Pupils are equal, round, and reactive to light.  Neck: Neck supple. No thyromegaly present.  Cardiovascular: Normal rate, regular rhythm, normal heart sounds and intact distal pulses.   No murmur heard. Pulmonary/Chest: Effort normal and breath sounds normal. No respiratory distress. She has no wheezes.  Musculoskeletal: Normal range of motion. She exhibits no edema or tenderness.  Lymphadenopathy:    She has no cervical adenopathy.  Neurological: She is alert and oriented to person, place, and time. She displays normal reflexes. A sensory deficit (slight numbness on lateral aspect of both feet) is present. No cranial nerve  deficit. She exhibits normal muscle tone. Coordination normal.  Skin: Skin is warm and dry. No rash noted. She is not diaphoretic.  Psychiatric: Her speech is normal and behavior is normal. Judgment and thought content normal. Her mood appears anxious. Her affect is labile. Her affect is not angry, not blunt and not inappropriate. She exhibits a depressed mood. She expresses no suicidal ideation. She expresses no suicidal plans.  Nursing note and vitals reviewed.      Assessment & Plan:   Problem List Items Addressed This Visit      Nervous and Auditory   Neuropathy (HCC)   Relevant Medications   pramipexole (MIRAPEX) 0.5 MG tablet   gabapentin (NEURONTIN) 300 MG capsule  Other Relevant Orders   Vitamin B12 (Completed)   Folate (Completed)     Other   Depression - Primary   Relevant Medications   venlafaxine XR (EFFEXOR XR) 37.5 MG 24 hr capsule   venlafaxine XR (EFFEXOR XR) 75 MG 24 hr capsule   Other Relevant Orders   TSH   VITAMIN D 25 Hydroxy (Vit-D Deficiency, Fractures) (Completed)   CBC with Differential/Platelet (Completed)    Other Visit Diagnoses    Tachycardia, paroxysmal (HCC)        Relevant Medications    carvedilol (COREG) 6.25 MG tablet    atorvastatin (LIPITOR) 40 MG tablet    fenofibrate (TRICOR) 145 MG tablet    Encounter for immunization        Well adult exam        Relevant Orders    CMP14+EGFR (Completed)    Screen for colon cancer        Relevant Orders    Ambulatory referral to Gastroenterology    Hyperlipidemia LDL goal <130        Relevant Medications    carvedilol (COREG) 6.25 MG tablet    atorvastatin (LIPITOR) 40 MG tablet    fenofibrate (TRICOR) 145 MG tablet    Other Relevant Orders    Lipid panel (Completed)        Follow up plan: Return in about 4 weeks (around 09/26/2015), or if symptoms worsen or fail to improve, for depression f/u.  Counseling provided for all of the vaccine components Orders Placed This Encounter    Procedures  . Flu Vaccine QUAD 36+ mos IM  . CMP14+EGFR  . Lipid panel  . TSH  . VITAMIN D 25 Hydroxy (Vit-D Deficiency, Fractures)  . Vitamin B12  . Folate  . CBC with Differential/Platelet  . Ambulatory referral to Gastroenterology    Caryl Pina, MD [facility] 08/29/2015, 11:31 AM

## 2015-08-30 ENCOUNTER — Encounter: Payer: Self-pay | Admitting: Gastroenterology

## 2015-08-30 LAB — CMP14+EGFR
ALT: 20 IU/L (ref 0–32)
AST: 20 IU/L (ref 0–40)
Albumin/Globulin Ratio: 1.5 (ref 1.1–2.5)
Albumin: 4.4 g/dL (ref 3.5–5.5)
Alkaline Phosphatase: 96 IU/L (ref 39–117)
BUN/Creatinine Ratio: 37 — ABNORMAL HIGH (ref 9–23)
BUN: 22 mg/dL (ref 6–24)
Bilirubin Total: 0.2 mg/dL (ref 0.0–1.2)
CO2: 22 mmol/L (ref 18–29)
Calcium: 9.4 mg/dL (ref 8.7–10.2)
Chloride: 99 mmol/L (ref 96–106)
Creatinine, Ser: 0.59 mg/dL (ref 0.57–1.00)
GFR calc Af Amer: 117 mL/min/{1.73_m2} (ref >59)
GFR calc non Af Amer: 101 mL/min/{1.73_m2} (ref >59)
Globulin, Total: 3 g/dL (ref 1.5–4.5)
Glucose: 88 mg/dL (ref 65–99)
Potassium: 4.1 mmol/L (ref 3.5–5.2)
Sodium: 143 mmol/L (ref 134–144)
Total Protein: 7.4 g/dL (ref 6.0–8.5)

## 2015-08-30 LAB — LIPID PANEL
Chol/HDL Ratio: 7.8 ratio units — ABNORMAL HIGH (ref 0.0–4.4)
Cholesterol, Total: 328 mg/dL — ABNORMAL HIGH (ref 100–199)
HDL: 42 mg/dL (ref >39)
Triglycerides: 1039 mg/dL (ref 0–149)

## 2015-08-30 LAB — FOLATE: Folate: 11.4 ng/mL (ref >3.0)

## 2015-08-30 LAB — CBC WITH DIFFERENTIAL/PLATELET
Basophils Absolute: 0.1 10*3/uL (ref 0.0–0.2)
Basos: 1 %
EOS (ABSOLUTE): 0.2 10*3/uL (ref 0.0–0.4)
Eos: 2 %
Hematocrit: 46.3 % (ref 34.0–46.6)
Hemoglobin: 15.9 g/dL (ref 11.1–15.9)
Immature Grans (Abs): 0 10*3/uL (ref 0.0–0.1)
Immature Granulocytes: 0 %
Lymphocytes Absolute: 2.6 10*3/uL (ref 0.7–3.1)
Lymphs: 28 %
MCH: 30.7 pg (ref 26.6–33.0)
MCHC: 34.3 g/dL (ref 31.5–35.7)
MCV: 89 fL (ref 79–97)
Monocytes Absolute: 0.6 10*3/uL (ref 0.1–0.9)
Monocytes: 6 %
Neutrophils Absolute: 5.8 10*3/uL (ref 1.4–7.0)
Neutrophils: 63 %
Platelets: 211 10*3/uL (ref 150–379)
RBC: 5.18 x10E6/uL (ref 3.77–5.28)
RDW: 14.8 % (ref 12.3–15.4)
WBC: 9.2 10*3/uL (ref 3.4–10.8)

## 2015-08-30 LAB — VITAMIN B12: Vitamin B-12: 417 pg/mL (ref 211–946)

## 2015-08-30 LAB — TSH: TSH: 0.665 u[IU]/mL (ref 0.450–4.500)

## 2015-08-30 LAB — VITAMIN D 25 HYDROXY (VIT D DEFICIENCY, FRACTURES): Vit D, 25-Hydroxy: 14.9 ng/mL — ABNORMAL LOW (ref 30.0–100.0)

## 2015-08-30 MED ORDER — FENOFIBRATE 145 MG PO TABS: 145.0000 mg | ORAL_TABLET | Freq: Every day | ORAL | Status: DC

## 2015-09-19 ENCOUNTER — Ambulatory Visit: Payer: Managed Care, Other (non HMO) | Admitting: Family Medicine

## 2015-10-10 ENCOUNTER — Encounter: Payer: Self-pay | Admitting: Gastroenterology

## 2015-10-16 ENCOUNTER — Telehealth: Payer: Self-pay | Admitting: Family Medicine

## 2015-10-16 NOTE — Telephone Encounter (Signed)
Scheduled

## 2015-10-18 ENCOUNTER — Encounter: Payer: Managed Care, Other (non HMO) | Admitting: Gastroenterology

## 2015-10-29 ENCOUNTER — Ambulatory Visit (AMBULATORY_SURGERY_CENTER): Payer: Self-pay

## 2015-10-29 VITALS — Ht 64.0 in | Wt 136.0 lb

## 2015-10-29 DIAGNOSIS — Z1211 Encounter for screening for malignant neoplasm of colon: Secondary | ICD-10-CM

## 2015-10-29 MED ORDER — NA SULFATE-K SULFATE-MG SULF 17.5-3.13-1.6 GM/177ML PO SOLN: ORAL | Status: DC

## 2015-10-29 NOTE — Progress Notes (Signed)
Per pt, no allergies to soy or egg products.Pt not taking any weight loss meds or using  O2 at home. 

## 2015-10-30 ENCOUNTER — Encounter: Payer: Self-pay | Admitting: Gastroenterology

## 2015-10-30 ENCOUNTER — Ambulatory Visit (AMBULATORY_SURGERY_CENTER): Payer: Managed Care, Other (non HMO) | Admitting: Gastroenterology

## 2015-10-30 VITALS — BP 115/71 | HR 94 | Temp 98.6°F | Resp 13 | Ht 64.0 in | Wt 136.0 lb

## 2015-10-30 DIAGNOSIS — Z1211 Encounter for screening for malignant neoplasm of colon: Secondary | ICD-10-CM

## 2015-10-30 DIAGNOSIS — D125 Benign neoplasm of sigmoid colon: Secondary | ICD-10-CM

## 2015-10-30 DIAGNOSIS — D122 Benign neoplasm of ascending colon: Secondary | ICD-10-CM

## 2015-10-30 MED ORDER — SODIUM CHLORIDE 0.9 % IV SOLN: 500.0000 mL | INTRAVENOUS | Status: DC

## 2015-10-30 NOTE — Patient Instructions (Addendum)
No aspirin for 5 days.  Discharge instructions given. Handout on polyps. Resume previous medications. YOU HAD AN ENDOSCOPIC PROCEDURE TODAY AT [facility]:   Refer to the procedure report that was given to you for any specific questions about what was found during the examination.  If the procedure report does not answer your questions, please call your gastroenterologist to clarify.  If you requested that your care partner not be given the details of your procedure findings, then the procedure report has been included in a sealed envelope for you to review at your convenience later.  YOU SHOULD EXPECT: Some feelings of bloating in the abdomen. Passage of more gas than usual.  Walking can help get rid of the air that was put into your GI tract during the procedure and reduce the bloating. If you had a lower endoscopy (such as a colonoscopy or flexible sigmoidoscopy) you may notice spotting of blood in your stool or on the toilet paper. If you underwent a bowel prep for your procedure, you may not have a normal bowel movement for a few days.  Please Note:  You might notice some irritation and congestion in your nose or some drainage.  This is from the oxygen used during your procedure.  There is no need for concern and it should clear up in a day or so.  SYMPTOMS TO REPORT IMMEDIATELY:   Following lower endoscopy (colonoscopy or flexible sigmoidoscopy):  Excessive amounts of blood in the stool  Significant tenderness or worsening of abdominal pains  Swelling of the abdomen that is new, acute  Fever of 100F or higher   For urgent or emergent issues, a gastroenterologist can be reached at any hour by calling [phone].   DIET: Your first meal following the procedure should be a small meal and then it is ok to progress to your normal diet. Heavy or fried foods are harder to digest and may make you feel nauseous or bloated.  Likewise, meals heavy in dairy and vegetables can  increase bloating.  Drink plenty of fluids but you should avoid alcoholic beverages for 24 hours.  ACTIVITY:  You should plan to take it easy for the rest of today and you should NOT DRIVE or use heavy machinery until tomorrow (because of the sedation medicines used during the test).    FOLLOW UP: Our staff will call the number listed on your records the next business day following your procedure to check on you and address any questions or concerns that you may have regarding the information given to you following your procedure. If we do not reach you, we will leave a message.  However, if you are feeling well and you are not experiencing any problems, there is no need to return our call.  We will assume that you have returned to your regular daily activities without incident.  If any biopsies were taken you will be contacted by phone or by letter within the next 1-3 weeks.  Please call us at [phone] if you have not heard about the biopsies in 3 weeks.    SIGNATURES/CONFIDENTIALITY: You and/or your care partner have signed paperwork which will be entered into your electronic medical record.  These signatures attest to the fact that that the information above on your After Visit Summary has been reviewed and is understood.  Full responsibility of the confidentiality of this discharge information lies with you and/or your care-partner.

## 2015-10-30 NOTE — Progress Notes (Signed)
Report to PACU, RN, vss, BBS= Clear.  

## 2015-10-30 NOTE — Progress Notes (Signed)
Called to room to assist during endoscopic procedure.  Patient ID and intended procedure confirmed with present staff. Received instructions for my participation in the procedure from the performing physician.  

## 2015-10-30 NOTE — Op Note (Signed)
[place]  [address] [place], [address]   COLONOSCOPY PROCEDURE REPORT  PATIENT: Myeshia, Fojtik  MR#: [id number] BIRTHDATE: 02-Jan-1956 , 59  yrs. old GENDER: female ENDOSCOPIST: Wilfrid Lund, MD REFERRED ME:BRAX Dettinger, MD PROCEDURE DATE:  10/30/2015 PROCEDURE:   Colonoscopy, screening and Colonoscopy with snare polypectomy  ASA CLASS:   Class II INDICATIONS:Screening for colonic neoplasia. MEDICATIONS: Monitored anesthesia care and Propofol 300 mg IV  DESCRIPTION OF PROCEDURE:   After the risks benefits and alternatives of the procedure were thoroughly explained, informed consent was obtained.  The digital rectal exam revealed no abnormalities of the rectum.   The LB [id number]  endoscope was introduced through the anus and advanced to the cecum, which was identified by both the appendix and ileocecal valve. No adverse events experienced.   The quality of the prep was good.  (Suprep was used)  The instrument was then slowly withdrawn as the colon was fully examined. Estimated blood loss is zero unless otherwise noted in this procedure report.      COLON FINDINGS: In the mid ascending colon, there were 2 polyps ranging from 2-6 mm in diameter.  They were both completely removed with snare cautery and retrieved.in the distal ascending colon, a 2 mm sessile polyp was completely removed with snare cautery and retrieved.  All 3 ascending colon polyps were sent together for histology. In the proximal sigmoid colon, a 2 mm sessile polyp was completely removed with snare cautery and retrieved. 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  Retroflexed views revealed no abnormalities. The time to cecum = 4.8 Withdrawal time = 17.8   The scope was withdrawn and the procedure completed. COMPLICATIONS: There were no immediate complications.  ENDOSCOPIC IMPRESSION: 1.   In the mid ascending colon, there were 2 polyps ranging from 2-6 mm in diameter.   They were both completely removed with snare cautery and retrieved.in the distal ascending colon, a 2 mm sessile polyp was completely removed with snare cautery and retrieved.  All 3 polyps were sent together for histology. In the proximal sigmoid colon, 2 mm sessile polyp was completelyremoved with snare cautery and retrieved. there were multiple small hyperplastic rectal polyps 2.   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

## 2015-10-31 ENCOUNTER — Telehealth: Payer: Self-pay | Admitting: *Deleted

## 2015-10-31 NOTE — Telephone Encounter (Signed)
  Follow up Call-  Call back number 10/30/2015  Post procedure Call Back phone  # [phone]  Permission to leave phone message Yes     Patient questions:  Do you have a fever, pain , or abdominal swelling? No. Pain Score  0 *  Have you tolerated food without any problems? Yes.    Have you been able to return to your normal activities? Yes.    Do you have any questions about your discharge instructions: Diet   No. Medications  No. Follow up visit  No.  Do you have questions or concerns about your Care? No.  Actions: * If pain score is 4 or above: No action needed, pain <4.

## 2015-11-05 ENCOUNTER — Encounter: Payer: Self-pay | Admitting: Gastroenterology

## 2015-11-05 ENCOUNTER — Other Ambulatory Visit: Payer: Self-pay | Admitting: Family Medicine

## 2015-12-06 ENCOUNTER — Encounter: Payer: Managed Care, Other (non HMO) | Admitting: *Deleted

## 2015-12-06 LAB — HM MAMMOGRAPHY

## 2015-12-08 ENCOUNTER — Other Ambulatory Visit: Payer: Self-pay | Admitting: Family Medicine

## 2015-12-18 ENCOUNTER — Encounter: Payer: Self-pay | Admitting: *Deleted

## 2016-01-09 ENCOUNTER — Ambulatory Visit: Payer: Managed Care, Other (non HMO) | Admitting: Family Medicine

## 2016-01-10 ENCOUNTER — Other Ambulatory Visit: Payer: Self-pay | Admitting: Family Medicine

## 2016-01-23 ENCOUNTER — Ambulatory Visit (INDEPENDENT_AMBULATORY_CARE_PROVIDER_SITE_OTHER): Payer: Managed Care, Other (non HMO) | Admitting: Family

## 2016-01-23 ENCOUNTER — Encounter: Payer: Self-pay | Admitting: Family

## 2016-01-23 ENCOUNTER — Ambulatory Visit (HOSPITAL_COMMUNITY)
Admission: RE | Admit: 2016-01-23 | Discharge: 2016-01-23 | Disposition: A | Discharge status: Home / Self Care | Payer: Managed Care, Other (non HMO) | Source: Ambulatory Visit | Attending: Family | Admitting: Family

## 2016-01-23 VITALS — BP 137/86 | HR 124 | Temp 97.0°F | Ht 64.0 in | Wt 143.4 lb

## 2016-01-23 DIAGNOSIS — Z87442 Personal history of urinary calculi: Secondary | ICD-10-CM

## 2016-01-23 DIAGNOSIS — R319 Hematuria, unspecified: Secondary | ICD-10-CM | POA: Insufficient documentation

## 2016-01-23 DIAGNOSIS — N200 Calculus of kidney: Secondary | ICD-10-CM | POA: Diagnosis not present

## 2016-01-23 DIAGNOSIS — K760 Fatty (change of) liver, not elsewhere classified: Secondary | ICD-10-CM | POA: Diagnosis not present

## 2016-01-23 LAB — URINALYSIS, COMPLETE
Bilirubin, UA: NEGATIVE
Glucose, UA: NEGATIVE
Ketones, UA: NEGATIVE
Leukocytes, UA: NEGATIVE
Nitrite, UA: NEGATIVE
Specific Gravity, UA: 1.025 (ref 1.005–1.030)
Urobilinogen, Ur: 1 mg/dL (ref 0.2–1.0)
pH, UA: 6 (ref 5.0–7.5)

## 2016-01-23 LAB — MICROSCOPIC EXAMINATION: RBC, UA: >30 /hpf — AB (ref 0–?)

## 2016-01-23 MED ORDER — TRAMADOL HCL 50 MG PO TABS: 50.0000 mg | ORAL_TABLET | Freq: Three times a day (TID) | ORAL | Status: DC | PRN

## 2016-01-23 NOTE — Progress Notes (Signed)
   Subjective:    Patient ID: Gabrielle Gallegos, female    DOB: [birth year], 60 y.o.   MRN: [id number]  Hematuria This is a new problem. The current episode started in the past 7 days. The problem is unchanged. She describes the hematuria as gross hematuria. The hematuria occurs during the initial portion of her urinary stream. Her pain is at a severity of 8/10. The pain is moderate. She describes her urine color as dark red. Irritative symptoms do not include frequency or nocturia. Obstructive symptoms include an intermittent stream and a slower stream. Obstructive symptoms do not include straining. Associated symptoms include abdominal pain. Pertinent negatives include no dysuria, flank pain, genital pain, inability to urinate, nausea or vomiting. Her past medical history is significant for kidney stones.  Back Pain Associated symptoms include abdominal pain. Pertinent negatives include no dysuria.  Abdominal Pain Associated symptoms include hematuria. Pertinent negatives include no dysuria, frequency, nausea or vomiting.      Review of Systems  Gastrointestinal: Positive for abdominal pain. Negative for nausea and vomiting.  Genitourinary: Positive for hematuria. Negative for dysuria, frequency, flank pain and nocturia.  Musculoskeletal: Positive for back pain.  All other systems reviewed and are negative.      Objective:   Physical Exam  Constitutional: She is oriented to person, place, and time. She appears well-developed and well-nourished. No distress.  HENT:  Head: Normocephalic and atraumatic.  Eyes: Pupils are equal, round, and reactive to light.  Neck: Normal range of motion. Neck supple. No thyromegaly present.  Cardiovascular: Normal rate, regular rhythm, normal heart sounds and intact distal pulses.   No murmur heard. Pulmonary/Chest: Effort normal and breath sounds normal. No respiratory distress. She has no wheezes.  Abdominal: Soft. Bowel sounds are normal. She exhibits  no distension. There is no tenderness.  Musculoskeletal: Normal range of motion. She exhibits no edema or tenderness.  Mild let CVA tenderness  Neurological: She is alert and oriented to person, place, and time.  Skin: Skin is warm and dry.  Psychiatric: She has a normal mood and affect. Her behavior is normal. Judgment and thought content normal.  Vitals reviewed.   BP 137/86 mmHg  Pulse 124  Temp(Src) 97 F (36.1 C) (Oral)  Ht '5\' 4"'$  (1.626 m)  Wt 143 lb 6.4 oz (65.046 kg)  BMI 24.60 kg/m2       Assessment & Plan:  1. Hematuria - Urinalysis, Complete - CT RENAL STONE STUDY; Future - traMADol (ULTRAM) 50 MG tablet; Take 1-2 tablets (50-100 mg total) by mouth every 8 (eight) hours as needed.  Dispense: 45 tablet; Refill: 0  2. History of kidney stones - CT RENAL STONE STUDY; Future - traMADol (ULTRAM) 50 MG tablet; Take 1-2 tablets (50-100 mg total) by mouth every 8 (eight) hours as needed.  Dispense: 45 tablet; Refill: 0  Force fluids Stat CT ordered Pt has Urologists but states she could not get appt until 1 month from now  Gabrielle Gallegos, [place]

## 2016-01-23 NOTE — Patient Instructions (Signed)
Kidney Stones °Kidney stones (urolithiasis) are deposits that form inside your kidneys. The intense pain is caused by the stone moving through the urinary tract. When the stone moves, the ureter goes into spasm around the stone. The stone is usually passed in the urine.  °CAUSES  °· A disorder that makes certain neck glands produce too much parathyroid hormone (primary hyperparathyroidism). °· A buildup of uric acid crystals, similar to gout in your joints. °· Narrowing (stricture) of the ureter. °· A kidney obstruction present at birth (congenital obstruction). °· Previous surgery on the kidney or ureters. °· Numerous kidney infections. °SYMPTOMS  °· Feeling sick to your stomach (nauseous). °· Throwing up (vomiting). °· Blood in the urine (hematuria). °· Pain that usually spreads (radiates) to the groin. °· Frequency or urgency of urination. °DIAGNOSIS  °· Taking a history and physical exam. °· Blood or urine tests. °· CT scan. °· Occasionally, an examination of the inside of the urinary bladder (cystoscopy) is performed. °TREATMENT  °· Observation. °· Increasing your fluid intake. °· Extracorporeal shock wave lithotripsy--This is a noninvasive procedure that uses shock waves to break up kidney stones. °· Surgery may be needed if you have severe pain or persistent obstruction. There are various surgical procedures. Most of the procedures are performed with the use of small instruments. Only small incisions are needed to accommodate these instruments, so recovery time is minimized. °The size, location, and chemical composition are all important variables that will determine the proper choice of action for you. Talk to your health care provider to better understand your situation so that you will minimize the risk of injury to yourself and your kidney.  °HOME CARE INSTRUCTIONS  °· Drink enough water and fluids to keep your urine clear or pale yellow. This will help you to pass the stone or stone fragments. °· Strain  all urine through the provided strainer. Keep all particulate matter and stones for your health care provider to see. The stone causing the pain may be as small as a grain of salt. It is very important to use the strainer each and every time you pass your urine. The collection of your stone will allow your health care provider to analyze it and verify that a stone has actually passed. The stone analysis will often identify what you can do to reduce the incidence of recurrences. °· Only take over-the-counter or prescription medicines for pain, discomfort, or fever as directed by your health care provider. °· Keep all follow-up visits as told by your health care provider. This is important. °· Get follow-up X-rays if required. The absence of pain does not always mean that the stone has passed. It may have only stopped moving. If the urine remains completely obstructed, it can cause loss of kidney function or even complete destruction of the kidney. It is your responsibility to make sure X-rays and follow-ups are completed. Ultrasounds of the kidney can show blockages and the status of the kidney. Ultrasounds are not associated with any radiation and can be performed easily in a matter of minutes. °· Make changes to your daily diet as told by your health care provider. You may be told to: °¨ Limit the amount of salt that you eat. °¨ Eat 5 or more servings of fruits and vegetables each day. °¨ Limit the amount of meat, poultry, fish, and eggs that you eat. °· Collect a 24-hour urine sample as told by your health care provider. You may need to collect another urine sample every 6-12   months. °SEEK MEDICAL CARE IF: °· You experience pain that is progressive and unresponsive to any pain medicine you have been prescribed. °SEEK IMMEDIATE MEDICAL CARE IF:  °· Pain cannot be controlled with the prescribed medicine. °· You have a fever or shaking chills. °· The severity or intensity of pain increases over 18 hours and is not  relieved by pain medicine. °· You develop a new onset of abdominal pain. °· You feel faint or pass out. °· You are unable to urinate. °  °This information is not intended to replace advice given to you by your health care provider. Make sure you discuss any questions you have with your health care provider. °  °Document Released: 08/24/2005 Document Revised: 05/15/2015 Document Reviewed: 01/25/2013 °Elsevier Interactive Patient Education ©2016 [employer] ° °

## 2016-01-24 ENCOUNTER — Other Ambulatory Visit: Payer: Self-pay | Admitting: Family

## 2016-01-24 ENCOUNTER — Telehealth: Payer: Self-pay | Admitting: Family

## 2016-01-24 DIAGNOSIS — N200 Calculus of kidney: Secondary | ICD-10-CM

## 2016-01-24 DIAGNOSIS — R319 Hematuria, unspecified: Secondary | ICD-10-CM

## 2016-01-24 NOTE — Telephone Encounter (Signed)
Pt states she has already spoken with somebody and is just waiting for her other tests to be ordered.

## 2016-02-18 ENCOUNTER — Other Ambulatory Visit: Payer: Self-pay | Admitting: Family Medicine

## 2016-02-19 ENCOUNTER — Ambulatory Visit: Payer: Self-pay | Admitting: Family Medicine

## 2016-02-21 ENCOUNTER — Encounter: Payer: Self-pay | Admitting: Family Medicine

## 2016-02-24 ENCOUNTER — Ambulatory Visit: Payer: Self-pay | Admitting: Family Medicine

## 2016-03-23 ENCOUNTER — Ambulatory Visit: Payer: Managed Care, Other (non HMO) | Admitting: Family Medicine

## 2016-04-02 ENCOUNTER — Encounter: Payer: Self-pay | Admitting: Family Medicine

## 2016-04-02 ENCOUNTER — Ambulatory Visit (INDEPENDENT_AMBULATORY_CARE_PROVIDER_SITE_OTHER): Payer: Managed Care, Other (non HMO) | Admitting: Family Medicine

## 2016-04-02 VITALS — BP 137/88 | HR 83 | Temp 97.8°F | Ht 64.0 in | Wt 142.4 lb

## 2016-04-02 DIAGNOSIS — E559 Vitamin D deficiency, unspecified: Secondary | ICD-10-CM | POA: Diagnosis not present

## 2016-04-02 DIAGNOSIS — J42 Unspecified chronic bronchitis: Secondary | ICD-10-CM

## 2016-04-02 DIAGNOSIS — I479 Paroxysmal tachycardia, unspecified: Secondary | ICD-10-CM

## 2016-04-02 DIAGNOSIS — F329 Major depressive disorder, single episode, unspecified: Secondary | ICD-10-CM

## 2016-04-02 DIAGNOSIS — E785 Hyperlipidemia, unspecified: Secondary | ICD-10-CM | POA: Diagnosis not present

## 2016-04-02 DIAGNOSIS — F32A Depression, unspecified: Secondary | ICD-10-CM

## 2016-04-02 MED ORDER — UMECLIDINIUM BROMIDE 62.5 MCG/INH IN AEPB: 1.0000 | INHALATION_SPRAY | Freq: Every day | RESPIRATORY_TRACT | Status: DC

## 2016-04-02 MED ORDER — UMECLIDINIUM BROMIDE 62.5 MCG/INH IN AEPB: 1.0000 | INHALATION_SPRAY | Freq: Every day | RESPIRATORY_TRACT | 2 refills | Status: DC

## 2016-04-02 MED ORDER — CLONAZEPAM 0.5 MG PO TABS: 0.5000 mg | ORAL_TABLET | Freq: Every day | ORAL | 2 refills | Status: DC

## 2016-04-02 MED ORDER — MELOXICAM 15 MG PO TABS: 15.0000 mg | ORAL_TABLET | Freq: Every day | ORAL | 2 refills | Status: DC

## 2016-04-02 MED ORDER — CARVEDILOL 6.25 MG PO TABS: 6.2500 mg | ORAL_TABLET | Freq: Two times a day (BID) | ORAL | 3 refills | Status: DC

## 2016-04-02 MED ORDER — GABAPENTIN 300 MG PO CAPS: 300.0000 mg | ORAL_CAPSULE | Freq: Three times a day (TID) | ORAL | 2 refills | Status: DC

## 2016-04-02 MED ORDER — FENOFIBRATE 145 MG PO TABS: 145.0000 mg | ORAL_TABLET | Freq: Every day | ORAL | 2 refills | Status: DC

## 2016-04-02 MED ORDER — ALBUTEROL SULFATE HFA 108 (90 BASE) MCG/ACT IN AERS: 2.0000 | INHALATION_SPRAY | Freq: Four times a day (QID) | RESPIRATORY_TRACT | 0 refills | Status: DC | PRN

## 2016-04-02 MED ORDER — VENLAFAXINE HCL ER 150 MG PO CP24: 150.0000 mg | ORAL_CAPSULE | Freq: Every day | ORAL | 1 refills | Status: DC

## 2016-04-02 MED ORDER — ATORVASTATIN CALCIUM 40 MG PO TABS: 40.0000 mg | ORAL_TABLET | Freq: Every day | ORAL | 2 refills | Status: DC

## 2016-04-02 NOTE — Progress Notes (Signed)
BP 137/88 (BP Location: Left Arm, Patient Position: Sitting, Cuff Size: Normal)   Pulse 83   Temp 97.8 F (36.6 C) (Oral)   Ht '5\' 4"'$  (1.626 m)   Wt 142 lb 6.4 oz (64.6 kg)   SpO2 97%   BMI 24.44 kg/m    Subjective:    Patient ID: Gabrielle Gallegos, female    DOB: May 30, 1956, 60 y.o.   MRN: [id number]  HPI: Gabrielle Gallegos is a 60 y.o. female presenting on 04/02/2016 for Cough (x 2 months, shortness of breath at times) and Medication Refill   HPI Cough and congestion Patient has been having persistent cough and congestion and occasionally shortness of breath and wheezing that's been going on for the past couple months. She denies any fevers or chills. She denies any sick contacts that she knows of. Her cough is mostly nonproductive.  Hyperlipidemia recheck Patient is coming in today for a cholesterol recheck. She denies any issues with her current medication. She is currently on Lipitor 40 mg.  Tachycardia recheck Patient has been on Coreg for her tachycardia to control her heart rate which sometimes gets increased and she has palpitations. Since she's been on the Coreg has managed this very well. She still occasionally gets it but does not get his rapid as it was before. Patient denies headaches, blurred vision, chest pains, shortness of breath, or weakness. Denies any side effects from medication and is content with current medication.   Depression and anxiety Patient was doing very well on the Effexor 75 mg but feels like over the past month to month and a half that it has been working as well. She denies any suicidal ideations. She is having a lot more stress and anxiety and some feelings of depression. She is still sleeping mostly at night but not every night. She says sometimes anxiety will keep her up somewhat. She would like to try to change at this point or increase since that'll help more.  Relevant past medical, surgical, family and social history reviewed and updated as  indicated. Interim medical history since our last visit reviewed. Allergies and medications reviewed and updated.  Review of Systems  Constitutional: Negative for chills and fever.  HENT: Negative for congestion, ear discharge, ear pain, rhinorrhea, sinus pressure, sneezing and sore throat.   Eyes: Negative for redness and visual disturbance.  Respiratory: Positive for cough, shortness of breath and wheezing. Negative for chest tightness.   Cardiovascular: Negative for chest pain and leg swelling.  Genitourinary: Negative for difficulty urinating and dysuria.  Musculoskeletal: Negative for back pain and gait problem.  Skin: Negative for rash.  Neurological: Negative for light-headedness and headaches.  Psychiatric/Behavioral: Positive for dysphoric mood. Negative for agitation, behavioral problems, self-injury, sleep disturbance and suicidal ideas. The patient is nervous/anxious.   All other systems reviewed and are negative.   Per HPI unless specifically indicated above     Medication List       Accurate as of 04/02/16  1:26 PM. Always use your most recent med list.          albuterol 108 (90 Base) MCG/ACT inhaler Commonly known as:  PROVENTIL HFA;VENTOLIN HFA Inhale 2 puffs into the lungs every 6 (six) hours as needed for wheezing or shortness of breath.   atorvastatin 40 MG tablet Commonly known as:  LIPITOR Take 1 tablet (40 mg total) by mouth daily.   carvedilol 6.25 MG tablet Commonly known as:  COREG Take 1 tablet (6.25 mg total) by  mouth 2 (two) times daily with a meal.   clonazePAM 0.5 MG tablet Commonly known as:  KLONOPIN Take 1 tablet (0.5 mg total) by mouth at bedtime.   fenofibrate 145 MG tablet Commonly known as:  TRICOR Take 1 tablet (145 mg total) by mouth daily.   gabapentin 300 MG capsule Commonly known as:  NEURONTIN Take 1 capsule (300 mg total) by mouth 3 (three) times daily.   meloxicam 15 MG tablet Commonly known as:  MOBIC Take 1 tablet  (15 mg total) by mouth daily.   umeclidinium bromide 62.5 MCG/INH Aepb Commonly known as:  INCRUSE ELLIPTA Inhale 1 puff into the lungs daily.   venlafaxine XR 150 MG 24 hr capsule Commonly known as:  EFFEXOR-XR Take 1 capsule (150 mg total) by mouth daily with breakfast.          Objective:    BP 137/88 (BP Location: Left Arm, Patient Position: Sitting, Cuff Size: Normal)   Pulse 83   Temp 97.8 F (36.6 C) (Oral)   Ht '5\' 4"'$  (1.626 m)   Wt 142 lb 6.4 oz (64.6 kg)   SpO2 97%   BMI 24.44 kg/m   Wt Readings from Last 3 Encounters:  04/02/16 142 lb 6.4 oz (64.6 kg)  01/23/16 143 lb 6.4 oz (65 kg)  10/30/15 136 lb (61.7 kg)    Physical Exam  Constitutional: She is oriented to person, place, and time. She appears well-developed and well-nourished. No distress.  HENT:  Right Ear: External ear normal.  Left Ear: External ear normal.  Nose: Nose normal.  Mouth/Throat: Oropharynx is clear and moist. No oropharyngeal exudate.  Eyes: Conjunctivae and EOM are normal. Pupils are equal, round, and reactive to light.  Cardiovascular: Normal rate, regular rhythm, normal heart sounds and intact distal pulses.   No murmur heard. Pulmonary/Chest: Effort normal and breath sounds normal. No respiratory distress. She has no wheezes. She has no rales. She exhibits no tenderness.  Musculoskeletal: Normal range of motion. She exhibits no edema or tenderness.  Neurological: She is alert and oriented to person, place, and time. Coordination normal.  Skin: Skin is warm and dry. No rash noted. She is not diaphoretic.  Psychiatric: Her behavior is normal. Judgment normal. Her mood appears anxious. She exhibits a depressed mood. She expresses no suicidal ideation. She expresses no suicidal plans.  Nursing note and vitals reviewed.   Results for orders placed or performed in visit on 01/23/16  Microscopic Examination  Result Value Ref Range   WBC, UA 0-5 0 - 5 /hpf   RBC, UA >30 (A) 0 - 2 /hpf    Epithelial Cells (non renal) 0-10 0 - 10 /hpf   Bacteria, UA Moderate (A) None seen/Few  Urinalysis, Complete  Result Value Ref Range   Specific Gravity, UA 1.025 1.005 - 1.030   pH, UA 6.0 5.0 - 7.5   Color, UA Brown (A) Yellow   Appearance Ur Hazy (A) Clear   Leukocytes, UA Negative Negative   Protein, UA 2+ (A) Negative/Trace   Glucose, UA Negative Negative   Ketones, UA Negative Negative   RBC, UA 3+ (A) Negative   Bilirubin, UA Negative Negative   Urobilinogen, Ur 1.0 0.2 - 1.0 mg/dL   Nitrite, UA Negative Negative   Microscopic Examination See below:       Assessment & Plan:   Problem List Items Addressed This Visit      Other   Depression - Primary   Relevant Medications   venlafaxine  XR (EFFEXOR-XR) 150 MG 24 hr capsule    Other Visit Diagnoses    Hyperlipidemia LDL goal <130       Relevant Medications   atorvastatin (LIPITOR) 40 MG tablet   carvedilol (COREG) 6.25 MG tablet   fenofibrate (TRICOR) 145 MG tablet   Tachycardia, paroxysmal (HCC)       Relevant Medications   atorvastatin (LIPITOR) 40 MG tablet   carvedilol (COREG) 6.25 MG tablet   fenofibrate (TRICOR) 145 MG tablet   Chronic bronchitis, unspecified chronic bronchitis type (HCC)       Relevant Medications   albuterol (PROVENTIL HFA;VENTOLIN HFA) 108 (90 Base) MCG/ACT inhaler   umeclidinium bromide (INCRUSE ELLIPTA) 62.5 MCG/INH AEPB       Follow up plan: Return in about 3 months (around 07/03/2016), or if symptoms worsen or fail to improve, for Recheck depression and breathing.  Counseling provided for all of the vaccine components No orders of the defined types were placed in this encounter.   Caryl Pina, MD [facility] 04/02/2016, 1:26 PM

## 2016-04-17 ENCOUNTER — Other Ambulatory Visit: Payer: Self-pay | Admitting: Family Medicine

## 2016-05-27 ENCOUNTER — Encounter: Payer: Self-pay | Admitting: Family Medicine

## 2016-05-27 ENCOUNTER — Ambulatory Visit (INDEPENDENT_AMBULATORY_CARE_PROVIDER_SITE_OTHER): Payer: Managed Care, Other (non HMO) | Admitting: Family Medicine

## 2016-05-27 VITALS — BP 124/87 | HR 93 | Temp 98.1°F | Ht 64.0 in | Wt 146.0 lb

## 2016-05-27 DIAGNOSIS — J441 Chronic obstructive pulmonary disease with (acute) exacerbation: Secondary | ICD-10-CM | POA: Diagnosis not present

## 2016-05-27 MED ORDER — PREDNISONE 20 MG PO TABS: ORAL_TABLET | ORAL | 0 refills | Status: DC

## 2016-05-27 MED ORDER — CEFDINIR 300 MG PO CAPS: 300.0000 mg | ORAL_CAPSULE | Freq: Two times a day (BID) | ORAL | 0 refills | Status: DC

## 2016-05-27 NOTE — Progress Notes (Signed)
BP 124/87   Pulse 93   Temp 98.1 F (36.7 C) (Oral)   Ht '5\' 4"'$  (1.626 m)   Wt 146 lb (66.2 kg)   BMI 25.06 kg/m    Subjective:    Patient ID: Gabrielle Gallegos, female    DOB: 11/09/1955, 60 y.o.   MRN: [id number]  HPI: Gabrielle Gallegos is a 60 y.o. female presenting on 05/27/2016 for Sinusitis (sinus congestion and pressure; surgery scheduled for Monday to remove a bladder tumor) and Cough   HPI Congestion and sinus congestion and cough Patient comes in today because she has been having over the past 2 or 3 days increased congestion and cough and a mild wheeze. She denies any fevers or chills or shortness of breath. She does have a surgical procedure planned for next week and will call the physician today and notify them that she is being treated for COPD exacerbation. Her cough is productive of yellow-green sputum. She has continued to use her albuterol inhaler and her other inhalers for COPD.  Relevant past medical, surgical, family and social history reviewed and updated as indicated. Interim medical history since our last visit reviewed. Allergies and medications reviewed and updated.  Review of Systems  Constitutional: Negative for chills and fever.  HENT: Positive for congestion, postnasal drip, rhinorrhea, sinus pressure, sneezing and sore throat. Negative for ear discharge and ear pain.   Eyes: Negative for pain, redness and visual disturbance.  Respiratory: Positive for cough and wheezing. Negative for chest tightness and shortness of breath.   Cardiovascular: Negative for chest pain and leg swelling.  Genitourinary: Negative for difficulty urinating and dysuria.  Musculoskeletal: Negative for back pain and gait problem.  Skin: Negative for rash.  Neurological: Negative for light-headedness and headaches.  Psychiatric/Behavioral: Negative for agitation and behavioral problems.  All other systems reviewed and are negative.   Per HPI unless specifically indicated  above     Medication List       Accurate as of 05/27/16 10:28 AM. Always use your most recent med list.          albuterol 108 (90 Base) MCG/ACT inhaler Commonly known as:  PROVENTIL HFA;VENTOLIN HFA Inhale 2 puffs into the lungs every 6 (six) hours as needed for wheezing or shortness of breath.   atorvastatin 40 MG tablet Commonly known as:  LIPITOR Take 1 tablet (40 mg total) by mouth daily.   carvedilol 6.25 MG tablet Commonly known as:  COREG Take 1 tablet (6.25 mg total) by mouth 2 (two) times daily with a meal.   cefdinir 300 MG capsule Commonly known as:  OMNICEF Take 1 capsule (300 mg total) by mouth 2 (two) times daily. 1 po BID   clonazePAM 0.5 MG tablet Commonly known as:  KLONOPIN Take 1 tablet (0.5 mg total) by mouth at bedtime.   fenofibrate 145 MG tablet Commonly known as:  TRICOR Take 1 tablet (145 mg total) by mouth daily.   gabapentin 300 MG capsule Commonly known as:  NEURONTIN Take 1 capsule (300 mg total) by mouth 3 (three) times daily.   meloxicam 15 MG tablet Commonly known as:  MOBIC Take 1 tablet (15 mg total) by mouth daily.   pramipexole 0.5 MG tablet Commonly known as:  MIRAPEX TAKE ONE TABLET BY MOUTH ONCE DAILY AT BEDTIME AS NEEDED, MAY REPEAT 1 TIME IF NEEDED   predniSONE 20 MG tablet Commonly known as:  DELTASONE 2 po at same time daily for 5 days   umeclidinium  bromide 62.5 MCG/INH Aepb Commonly known as:  INCRUSE ELLIPTA Inhale 1 puff into the lungs daily.   venlafaxine XR 150 MG 24 hr capsule Commonly known as:  EFFEXOR-XR Take 1 capsule (150 mg total) by mouth daily with breakfast.          Objective:    BP 124/87   Pulse 93   Temp 98.1 F (36.7 C) (Oral)   Ht '5\' 4"'$  (1.626 m)   Wt 146 lb (66.2 kg)   BMI 25.06 kg/m   Wt Readings from Last 3 Encounters:  05/27/16 146 lb (66.2 kg)  04/02/16 142 lb 6.4 oz (64.6 kg)  01/23/16 143 lb 6.4 oz (65 kg)    Physical Exam  Constitutional: She is oriented to person,  place, and time. She appears well-developed and well-nourished. No distress.  HENT:  Right Ear: Tympanic membrane, external ear and ear canal normal.  Left Ear: Tympanic membrane, external ear and ear canal normal.  Nose: Mucosal edema and rhinorrhea present. No epistaxis. Right sinus exhibits no maxillary sinus tenderness and no frontal sinus tenderness. Left sinus exhibits no maxillary sinus tenderness and no frontal sinus tenderness.  Mouth/Throat: Uvula is midline and mucous membranes are normal. Posterior oropharyngeal edema and posterior oropharyngeal erythema present. No oropharyngeal exudate or tonsillar abscesses.  Eyes: Conjunctivae and EOM are normal.  Cardiovascular: Normal rate, regular rhythm, normal heart sounds and intact distal pulses.   No murmur heard. Pulmonary/Chest: Effort normal. No respiratory distress. She has wheezes (End expiratory wheezes in upper lobes bilaterally). She has no rales.  Musculoskeletal: Normal range of motion. She exhibits no edema or tenderness.  Neurological: She is alert and oriented to person, place, and time. Coordination normal.  Skin: Skin is warm and dry. No rash noted. She is not diaphoretic.  Psychiatric: She has a normal mood and affect. Her behavior is normal.  Vitals reviewed.     Assessment & Plan:   Problem List Items Addressed This Visit    None    Visit Diagnoses    COPD exacerbation ([place])    -  Primary   Relevant Medications   predniSONE (DELTASONE) 20 MG tablet   cefdinir (OMNICEF) 300 MG capsule       Follow up plan: Return if symptoms worsen or fail to improve.  Counseling provided for all of the vaccine components No orders of the defined types were placed in this encounter.   Caryl Pina, MD [facility] 05/27/2016, 10:28 AM

## 2016-05-28 ENCOUNTER — Encounter (HOSPITAL_BASED_OUTPATIENT_CLINIC_OR_DEPARTMENT_OTHER): Payer: Self-pay | Admitting: *Deleted

## 2016-05-28 ENCOUNTER — Other Ambulatory Visit: Payer: Self-pay | Admitting: Urology

## 2016-05-28 NOTE — Progress Notes (Signed)
NPO AFTER MN.  ARRIVE AT 0930.  NEEDS HG AND EKG.  WILL TAKE AM MEDS DOS W/ SIPS OF WATER AND DO INHALER.

## 2016-05-29 NOTE — H&P (Signed)
HPI: Gabrielle Gallegos is a 60 year-old female with a recently diagnosed bladder tumor.  She first noticed the symptoms approximately 12/07/2015. She did see the blood in her urine. She has seen blood clots.   She does not have a burning sensation when she urinates. She is not currently having trouble urinating.   She has had kidney stones. She is not having pain.   She passed what she thought was a small stone about 2 weeks ago but tells me that she has been having intermittent gross hematuria about every 2 weeks with the passage of clots each time.   Interval history: She said she may have seen some pink tinged urine but no grossly bloody urine or clots. She's not had any flank pain.     ALLERGIES: Vicodin TABS    MEDICATIONS: No Medications    GU PSH: Hysterectomy Unilat SO - 2008 Locm 300-'399Mg'$ /Ml Iodine,1Ml - 04/21/2016 Renal ESWL - 01/03/2015      PSH Notes: Neck Surgery, Lithotripsy, Hysterectomy, Knee Surgery   NON-GU PSH: None   GU PMH: Bladder, Neoplasm of uncertain behavior - 5/62/1308 Gross hematuria, She has been having intermittent gross hematuria and although this could be due to the passage of the stone I'm concerned by the fact that she has passed a stone and tells me that she's been having gross hematuria about every 2 weeks for several months now and is a cigarette smoker. She has had imaging studies of the kidneys but these have been without contrast. I therefore have discussed the need for a full hematuria evaluation to be performed. - 03/31/2016 Kidney Stone (Stable), Bilateral, She has what appear to be 2 left renal calculi currently and seems to a passed a stone about 2 weeks ago. Her left renal stones appears stable and there does not appear to be any evidence of metabolic stone activity at this time. - 03/31/2016      PMH Notes: Left renal calculi: She underwent lithotripsy of a 33m left renal calculus by Dr. PTerance Hartin 1/08.  CT scan 1/10 - small bilateral renal  calculi.  CT scan 10/14 - single 4 mm stone seen within the left kidney.  KUB 2/16 - 4 mm stone in the mid pole of the left kidney.  CT scan 01/23/16 - single punctate nonobstructing right renal calculus, 3 left renal calculi with intraparenchymal appearance of an upper pole and lower pole stone with cortical atrophy overlying these stones (unchanged from 11/16)  Stone analysis: Calcium oxalate 77% and calcium phosphate 23%. 24-hour urine - ordered but not performed.   Left renal cyst: This is been noted on previous CT scans dating back to 1/10.   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 There was one and possibly 2 lesions that were minute.     NON-GU PMH: Personal history of other endocrine, nutritional and metabolic disease, History of hypercholesterolemia - 2014 Personal history of other mental and behavioral disorders, History of depression - 2014    FAMILY HISTORY: 2 sons - Runs in Family Cancer - Father, Sister Myocarditis, acute - Runs In Family   SOCIAL HISTORY: Marital Status: Married Current Smoking Status: Patient smokes.  Has never drank.  Does not drink caffeine.     Notes: Current smoker, Alcohol use, Tobacco Use, Caffeine Use, Death In The Family Father, Occupation:, Marital History - Currently Married, Death In The Family Mother   REVIEW OF SYSTEMS:    GU Review Female:   Patient reports frequent urination, hard  to postpone urination, get up at night to urinate, trouble starting your stream, and have to strain to urinate. Patient denies burning /pain with urination, leakage of urine, stream starts and stops, and currently pregnant.  Gastrointestinal (Upper):   Patient denies nausea, vomiting, and indigestion/ heartburn.  Gastrointestinal (Lower):   Patient denies diarrhea and constipation.  Constitutional:   Patient denies fever, night sweats, weight loss, and fatigue.  Skin:   Patient denies skin rash/ lesion and itching.  Eyes:   Patient denies double vision and  blurred vision.  Ears/ Nose/ Throat:   Patient denies sore throat and sinus problems.  Hematologic/Lymphatic:   Patient denies swollen glands and easy bruising.  Cardiovascular:   Patient denies leg swelling and chest pains.  Respiratory:   Patient denies cough and shortness of breath.  Endocrine:   Patient denies excessive thirst.  Musculoskeletal:   Patient reports joint pain. Patient denies back pain.  Neurological:   Patient denies headaches and dizziness.  Psychologic:   Patient denies depression and anxiety.   VITAL SIGNS:     Weight 142 lb / 64.41 kg  Height 65 in / 165.1 cm  BP 131/84 mmHg  Pulse 111 /min  BMI 23.6 kg/m   Physical Exam  Constitutional: Well nourished and well developed . No acute distress.   ENT:. The ears and nose are normal in appearance.   Neck: The appearance of the neck is normal and no neck mass is present.   Pulmonary: No respiratory distress and normal respiratory rhythm and effort.   Cardiovascular: Heart rate and rhythm are normal . No peripheral edema.   Abdomen: The abdomen is soft and nontender. No masses are palpated. No CVA tenderness. No hernias are palpable. No hepatosplenomegaly noted.   Lymphatics: The femoral and inguinal nodes are not enlarged or tender.   Skin: Normal skin turgor, no visible rash and no visible skin lesions.   Neuro/Psych:. Mood and affect are appropriate.    PROCEDURES:         Flexible Cystoscopy - done on 05/20/16 Risks, benefits, and some of the potential complications of the procedure were discussed at length with the patient including infection, bleeding, voiding discomfort and others. All questions were answered. Sterile technique and intraurethral analgesia were used.  Meatus:  Normal size. Normal location. Normal condition.  Urethra:  No hypermobility. No leakage.  Ureteral Orifices:  Normal location. Normal size. Normal shape. Effluxed clear urine.  Bladder:  A left lateral wall tumor. 2 1/2 cm  tumor. Solitary tumor. No trabeculation. Normal mucosa. No stones.      The lower urinary tract was carefully examined. The procedure was well-tolerated and without complications. Instructions were given to call the office immediately for bloody urine, difficulty urinating, urinary retention, painful or frequent urination, fever or other illness. The patient stated that she understood these instructions and would comply with them.         Urinalysis - 81003 Dipstick Dipstick Cont'd  Specimen: Voided Bilirubin: Neg  Color: Yellow Ketones: Neg  Appearance: Clear Blood: Neg  Specific Gravity: 1.025 Protein: Neg  pH: 5.5 Urobilinogen: 0.2  Glucose: Neg Nitrites: Neg    Leukocyte Esterase: Neg    ASSESSMENT:      ICD-10 Details  1 GU:   Bladder, Neoplasm of uncertain behavior - I34.7 I went over the results of the CT scan as well as my cystoscopic findings today which have revealed a bladder mass consistent with transitional cell carcinoma. We discussed the fact that  currently there is no evidence of extravesical extension or pelvic adenopathy based on CT scan findings. Further characterization of the lesion is required for grading and staging purposes. We discussed proceeding with evaluation using transurethral resection of the lesion. I have discussed the procedure in detail as well as the potential risks and complications associated with this form of surgery. We also discussed the probability of successful resection of the intravesical portion of this lesion. I have recommended, as long as there is no contraindication at the time of surgery, the placement of intravesical mitomycin-C in order to reduce the risk of recurrence. We did discuss the potential side effects of this form of intravesical chemotherapy. The procedure will be performed under anesthesia as an outpatient.  2   Gross hematuria - R31.0 We discussed the fact that it appears her hematuria is secondary to her newly diagnosed bladder  tumor and not to her renal calculi.  3   Kidney Stone - N20.0 Bilateral, She does have bilateral renal calculi. They are small and nonobstructing.              Notes:   The tumor is located on the floor of the bladder and extends onto the left wall and is posterior to the left ureteral orifice.    PLAN: TURBT with postoperative mitomycin-C

## 2016-06-01 ENCOUNTER — Other Ambulatory Visit: Payer: Self-pay

## 2016-06-01 ENCOUNTER — Encounter (HOSPITAL_BASED_OUTPATIENT_CLINIC_OR_DEPARTMENT_OTHER): Payer: Self-pay

## 2016-06-01 ENCOUNTER — Ambulatory Visit (HOSPITAL_BASED_OUTPATIENT_CLINIC_OR_DEPARTMENT_OTHER)
Admission: RE | Admit: 2016-06-01 | Discharge: 2016-06-01 | Disposition: A | Discharge status: Home / Self Care | Payer: Managed Care, Other (non HMO) | Source: Ambulatory Visit | Attending: Urology | Admitting: Urology

## 2016-06-01 ENCOUNTER — Ambulatory Visit (HOSPITAL_BASED_OUTPATIENT_CLINIC_OR_DEPARTMENT_OTHER): Payer: Managed Care, Other (non HMO) | Admitting: Certified Registered"

## 2016-06-01 ENCOUNTER — Encounter (HOSPITAL_BASED_OUTPATIENT_CLINIC_OR_DEPARTMENT_OTHER)
Admission: RE | Disposition: A | Discharge status: Home / Self Care | Payer: Self-pay | Source: Ambulatory Visit | Attending: Urology

## 2016-06-01 DIAGNOSIS — E7800 Pure hypercholesterolemia, unspecified: Secondary | ICD-10-CM | POA: Diagnosis not present

## 2016-06-01 DIAGNOSIS — J449 Chronic obstructive pulmonary disease, unspecified: Secondary | ICD-10-CM | POA: Diagnosis not present

## 2016-06-01 DIAGNOSIS — C678 Malignant neoplasm of overlapping sites of bladder: Secondary | ICD-10-CM | POA: Insufficient documentation

## 2016-06-01 DIAGNOSIS — F329 Major depressive disorder, single episode, unspecified: Secondary | ICD-10-CM | POA: Insufficient documentation

## 2016-06-01 DIAGNOSIS — F17210 Nicotine dependence, cigarettes, uncomplicated: Secondary | ICD-10-CM | POA: Insufficient documentation

## 2016-06-01 DIAGNOSIS — D414 Neoplasm of uncertain behavior of bladder: Secondary | ICD-10-CM | POA: Diagnosis present

## 2016-06-01 DIAGNOSIS — Z90710 Acquired absence of both cervix and uterus: Secondary | ICD-10-CM | POA: Diagnosis not present

## 2016-06-01 DIAGNOSIS — Z87442 Personal history of urinary calculi: Secondary | ICD-10-CM | POA: Diagnosis not present

## 2016-06-01 DIAGNOSIS — N200 Calculus of kidney: Secondary | ICD-10-CM | POA: Diagnosis not present

## 2016-06-01 DIAGNOSIS — D494 Neoplasm of unspecified behavior of bladder: Secondary | ICD-10-CM

## 2016-06-01 HISTORY — PX: TRANSURETHRAL RESECTION OF BLADDER TUMOR WITH MITOMYCIN-C: SHX6459

## 2016-06-01 HISTORY — DX: Urgency of urination: R39.15

## 2016-06-01 HISTORY — DX: Personal history of other diseases of urinary system: Z87.448

## 2016-06-01 HISTORY — DX: Presence of spectacles and contact lenses: Z97.3

## 2016-06-01 HISTORY — DX: Generalized anxiety disorder: F41.1

## 2016-06-01 HISTORY — DX: Mixed incontinence: N39.46

## 2016-06-01 HISTORY — DX: Other specified cough: R05.8

## 2016-06-01 HISTORY — DX: Personal history of colonic polyps: Z86.010

## 2016-06-01 HISTORY — DX: Hyperlipidemia, unspecified: E78.5

## 2016-06-01 HISTORY — DX: Chronic obstructive pulmonary disease, unspecified: J44.9

## 2016-06-01 HISTORY — DX: Polyneuropathy, unspecified: G62.9

## 2016-06-01 HISTORY — DX: Neoplasm of unspecified behavior of bladder: D49.4

## 2016-06-01 HISTORY — DX: Calculus of kidney: N20.0

## 2016-06-01 HISTORY — DX: Cough: R05

## 2016-06-01 HISTORY — DX: Personal history of other diseases of the respiratory system: Z87.09

## 2016-06-01 HISTORY — DX: Personal history of urinary calculi: Z87.442

## 2016-06-01 HISTORY — DX: Personal history of adenomatous and serrated colon polyps: Z86.0101

## 2016-06-01 HISTORY — DX: Chronic obstructive pulmonary disease with (acute) exacerbation: J44.1

## 2016-06-01 HISTORY — DX: Cyst of kidney, acquired: N28.1

## 2016-06-01 HISTORY — DX: Paroxysmal tachycardia, unspecified: I47.9

## 2016-06-01 HISTORY — DX: Personal history of other mental and behavioral disorders: Z86.59

## 2016-06-01 LAB — POCT HEMOGLOBIN-HEMACUE: Hemoglobin: 14.7 g/dL (ref 12.0–15.0)

## 2016-06-01 SURGERY — TRANSURETHRAL RESECTION OF BLADDER TUMOR WITH MITOMYCIN-C
Anesthesia: General | Site: Renal

## 2016-06-01 MED ORDER — CIPROFLOXACIN IN D5W 400 MG/200ML IV SOLN
400.0000 mg | INTRAVENOUS | Status: AC
  Administered 2016-06-01: 400 mg via INTRAVENOUS
  Filled 2016-06-01: qty 200

## 2016-06-01 MED ORDER — GLYCOPYRROLATE 0.2 MG/ML IV SOSY
PREFILLED_SYRINGE | INTRAVENOUS | Status: DC | PRN
  Administered 2016-06-01: .2 mg via INTRAVENOUS

## 2016-06-01 MED ORDER — DEXAMETHASONE SODIUM PHOSPHATE 4 MG/ML IJ SOLN
INTRAMUSCULAR | Status: DC | PRN
  Administered 2016-06-01: 10 mg via INTRAVENOUS

## 2016-06-01 MED ORDER — LACTATED RINGERS IV SOLN
INTRAVENOUS | Status: DC
  Administered 2016-06-01 (×2): via INTRAVENOUS
  Filled 2016-06-01: qty 1000

## 2016-06-01 MED ORDER — LIDOCAINE 2% (20 MG/ML) 5 ML SYRINGE
INTRAMUSCULAR | Status: DC | PRN
Start: 2016-06-01 — End: 2016-06-01
  Administered 2016-06-01: 60 mg via INTRAVENOUS

## 2016-06-01 MED ORDER — EPHEDRINE SULFATE 50 MG/ML IJ SOLN: INTRAMUSCULAR | Status: DC | PRN

## 2016-06-01 MED ORDER — PROPOFOL 10 MG/ML IV BOLUS
INTRAVENOUS | Status: AC
  Filled 2016-06-01: qty 20

## 2016-06-01 MED ORDER — MIDAZOLAM HCL 5 MG/5ML IJ SOLN
INTRAMUSCULAR | Status: DC | PRN
  Administered 2016-06-01: 2 mg via INTRAVENOUS

## 2016-06-01 MED ORDER — FENTANYL CITRATE (PF) 100 MCG/2ML IJ SOLN
INTRAMUSCULAR | Status: DC | PRN
  Administered 2016-06-01: 50 ug via INTRAVENOUS

## 2016-06-01 MED ORDER — FENTANYL CITRATE (PF) 100 MCG/2ML IJ SOLN
INTRAMUSCULAR | Status: AC
  Filled 2016-06-01: qty 2

## 2016-06-01 MED ORDER — HYDROMORPHONE HCL 1 MG/ML IJ SOLN
0.2500 mg | INTRAMUSCULAR | Status: DC | PRN
  Administered 2016-06-01: 0.5 mg via INTRAVENOUS
  Filled 2016-06-01: qty 1

## 2016-06-01 MED ORDER — EPHEDRINE SULFATE-NACL 50-0.9 MG/10ML-% IV SOSY
PREFILLED_SYRINGE | INTRAVENOUS | Status: DC | PRN
  Administered 2016-06-01 (×2): 10 mg via INTRAVENOUS

## 2016-06-01 MED ORDER — GLYCOPYRROLATE 0.2 MG/ML IJ SOLN: INTRAMUSCULAR | Status: DC | PRN

## 2016-06-01 MED ORDER — PHENAZOPYRIDINE HCL 200 MG PO TABS
200.0000 mg | ORAL_TABLET | Freq: Three times a day (TID) | ORAL | Status: DC
  Administered 2016-06-01: 200 mg via ORAL
  Filled 2016-06-01: qty 1

## 2016-06-01 MED ORDER — SODIUM CHLORIDE 0.9 % IR SOLN
Status: DC | PRN
  Administered 2016-06-01: 9000 mL

## 2016-06-01 MED ORDER — ONDANSETRON HCL 4 MG/2ML IJ SOLN
INTRAMUSCULAR | Status: DC | PRN
  Administered 2016-06-01: 4 mg via INTRAVENOUS

## 2016-06-01 MED ORDER — PHENAZOPYRIDINE HCL 100 MG PO TABS
ORAL_TABLET | ORAL | Status: AC
  Filled 2016-06-01: qty 2

## 2016-06-01 MED ORDER — PHENAZOPYRIDINE HCL 200 MG PO TABS: 200.0000 mg | ORAL_TABLET | Freq: Three times a day (TID) | ORAL | 0 refills | Status: DC | PRN

## 2016-06-01 MED ORDER — ONDANSETRON HCL 4 MG/2ML IJ SOLN
INTRAMUSCULAR | Status: AC
  Filled 2016-06-01: qty 2

## 2016-06-01 MED ORDER — MIDAZOLAM HCL 2 MG/2ML IJ SOLN
INTRAMUSCULAR | Status: AC
  Filled 2016-06-01: qty 2

## 2016-06-01 MED ORDER — CIPROFLOXACIN IN D5W 400 MG/200ML IV SOLN
INTRAVENOUS | Status: AC
  Filled 2016-06-01: qty 200

## 2016-06-01 MED ORDER — PROMETHAZINE HCL 25 MG/ML IJ SOLN
6.2500 mg | INTRAMUSCULAR | Status: DC | PRN
  Filled 2016-06-01: qty 1

## 2016-06-01 MED ORDER — PROPOFOL 10 MG/ML IV BOLUS
INTRAVENOUS | Status: DC | PRN
  Administered 2016-06-01: 170 mg via INTRAVENOUS

## 2016-06-01 MED ORDER — LIDOCAINE 2% (20 MG/ML) 5 ML SYRINGE
INTRAMUSCULAR | Status: AC
  Filled 2016-06-01: qty 5

## 2016-06-01 MED ORDER — EPHEDRINE 5 MG/ML INJ
INTRAVENOUS | Status: AC
  Filled 2016-06-01: qty 10

## 2016-06-01 MED ORDER — GLYCOPYRROLATE 0.2 MG/ML IV SOSY
PREFILLED_SYRINGE | INTRAVENOUS | Status: AC
  Filled 2016-06-01: qty 3

## 2016-06-01 MED ORDER — HYDROCODONE-ACETAMINOPHEN 10-325 MG PO TABS: 1.0000 | ORAL_TABLET | ORAL | 0 refills | Status: DC | PRN

## 2016-06-01 MED ORDER — DEXAMETHASONE SODIUM PHOSPHATE 10 MG/ML IJ SOLN
INTRAMUSCULAR | Status: AC
  Filled 2016-06-01: qty 1

## 2016-06-01 MED ORDER — HYDROMORPHONE HCL 1 MG/ML IJ SOLN
INTRAMUSCULAR | Status: AC
  Filled 2016-06-01: qty 1

## 2016-06-01 MED ORDER — MITOMYCIN CHEMO FOR BLADDER INSTILLATION 40 MG
40.0000 mg | Freq: Once | INTRAVENOUS | Status: AC
  Administered 2016-06-01: 40 mg via INTRAVESICAL
  Filled 2016-06-01 (×2): qty 40

## 2016-06-01 SURGICAL SUPPLY — 33 items
BAG DRAIN URO-CYSTO SKYTR STRL (DRAIN) ×3 IMPLANT
BAG URINE DRAINAGE (UROLOGICAL SUPPLIES) ×3 IMPLANT
BAG URINE LEG 19OZ MD ST LTX (BAG) IMPLANT
CATH FOLEY 2WAY SLVR  5CC 18FR (CATHETERS) ×1
CATH FOLEY 2WAY SLVR 5CC 18FR (CATHETERS) ×2 IMPLANT
CATH FOLEY 3WAY 20FR (CATHETERS) IMPLANT
CATH FOLEY 3WAY 30CC 24FR (CATHETERS) ×1
CATH HEMA 3WAY 30CC 24FR COUDE (CATHETERS) IMPLANT
CATH HEMA 3WAY 30CC 24FR RND (CATHETERS) IMPLANT
CATH URTH STD 24FR FL 3W 2 (CATHETERS) ×2 IMPLANT
CLOTH BEACON ORANGE TIMEOUT ST (SAFETY) ×3 IMPLANT
ELECT BUTTON BIOP 24F 90D PLAS (MISCELLANEOUS) IMPLANT
ELECT LOOP MED HF 24F 12D (CUTTING LOOP) IMPLANT
ELECT REM PT RETURN 9FT ADLT (ELECTROSURGICAL)
ELECTRODE REM PT RTRN 9FT ADLT (ELECTROSURGICAL) IMPLANT
EVACUATOR MICROVAS BLADDER (UROLOGICAL SUPPLIES) IMPLANT
GLOVE BIO SURGEON STRL SZ8 (GLOVE) ×3 IMPLANT
GOWN STRL REUS W/ TWL LRG LVL3 (GOWN DISPOSABLE) ×2 IMPLANT
GOWN STRL REUS W/ TWL XL LVL3 (GOWN DISPOSABLE) ×2 IMPLANT
GOWN STRL REUS W/TWL LRG LVL3 (GOWN DISPOSABLE) ×1
GOWN STRL REUS W/TWL XL LVL3 (GOWN DISPOSABLE) ×1
HOLDER FOLEY CATH W/STRAP (MISCELLANEOUS) ×3 IMPLANT
IV NS IRRIG 3000ML ARTHROMATIC (IV SOLUTION) ×9 IMPLANT
KIT ROOM TURNOVER WOR (KITS) ×3 IMPLANT
LOOP CUT BIPOLAR 24F LRG (ELECTROSURGICAL) ×3 IMPLANT
MANIFOLD NEPTUNE II (INSTRUMENTS) IMPLANT
PACK CYSTO (CUSTOM PROCEDURE TRAY) ×3 IMPLANT
PLUG CATH AND CAP STER (CATHETERS) IMPLANT
SET ASPIRATION TUBING (TUBING) ×3 IMPLANT
SYR 30ML LL (SYRINGE) IMPLANT
SYRINGE IRR TOOMEY STRL 70CC (SYRINGE) IMPLANT
TUBE CONNECTING 12X1/4 (SUCTIONS) IMPLANT
WATER STERILE IRR 3000ML UROMA (IV SOLUTION) IMPLANT

## 2016-06-01 NOTE — Anesthesia Preprocedure Evaluation (Addendum)
Anesthesia Evaluation  Patient identified by MRN, date of birth, ID band Patient awake    Reviewed: Allergy & Precautions, NPO status , Patient's Chart, lab work & pertinent test results  Airway Mallampati: I  TM Distance: >3 FB Neck ROM: Full    Dental  (+) Teeth Intact   Pulmonary COPD, Current Smoker,   Mild end expiratory wheezes bilaterally  breath sounds clear to auscultation       Cardiovascular + dysrhythmias  Rhythm:Regular Rate:Normal  Tacycardia,    Neuro/Psych  Headaches,    GI/Hepatic   Endo/Other  negative endocrine ROS  Renal/GU      Musculoskeletal   Abdominal   Peds  Hematology negative hematology ROS (+)   Anesthesia Other Findings   Reproductive/Obstetrics                            Anesthesia Physical Anesthesia Plan  ASA: III  Anesthesia Plan: General   Post-op Pain Management:    Induction: Intravenous  Airway Management Planned: LMA  Additional Equipment:   Intra-op Plan:   Post-operative Plan: Extubation in OR  Informed Consent: I have reviewed the patients History and Physical, chart, labs and discussed the procedure including the risks, benefits and alternatives for the proposed anesthesia with the patient or authorized representative who has indicated his/her understanding and acceptance.     Plan Discussed with:   Anesthesia Plan Comments:         Anesthesia Quick Evaluation

## 2016-06-01 NOTE — Anesthesia Procedure Notes (Signed)
Procedure Name: LMA Insertion Date/Time: 06/01/2016 10:30 AM Performed by: Denna Haggard D Pre-anesthesia Checklist: Patient identified, Emergency Drugs available, Suction available and Patient being monitored Patient Re-evaluated:Patient Re-evaluated prior to inductionOxygen Delivery Method: Circle system utilized Preoxygenation: Pre-oxygenation with 100% oxygen Intubation Type: IV induction Ventilation: Mask ventilation without difficulty LMA: LMA inserted LMA Size: 4.0 Number of attempts: 1 Airway Equipment and Method: Bite block Placement Confirmation: positive ETCO2 Tube secured with: Tape Dental Injury: Teeth and Oropharynx as per pre-operative assessment

## 2016-06-01 NOTE — Transfer of Care (Signed)
Immediate Anesthesia Transfer of Care Note  Patient: Gabrielle Gallegos  Procedure(s) Performed: Procedure(s): TRANSURETHRAL RESECTION OF BLADDER TUMOR WITH MITOMYCIN-C  Patient Location: PACU  Anesthesia Type: General  Level of Consciousness: awake, oriented, sedated and patient cooperative  Airway & Oxygen Therapy: Patient Spontanous Breathing and Patient connected to face mask oxygen  Post-op Assessment: Report given to PACU RN and Post -op Vital signs reviewed and stable  Post vital signs: Reviewed and stable  Complications: No apparent anesthesia complications

## 2016-06-01 NOTE — Anesthesia Postprocedure Evaluation (Signed)
Anesthesia Post Note  Patient: Gabrielle Gallegos  Procedure(s) Performed: Procedure(s) (LRB): TRANSURETHRAL RESECTION OF BLADDER TUMOR WITH MITOMYCIN-C IN PACU POST OP (N/A)  Patient location during evaluation: PACU Anesthesia Type: General Level of consciousness: awake and alert Pain management: pain level controlled Vital Signs Assessment: post-procedure vital signs reviewed and stable Respiratory status: spontaneous breathing, nonlabored ventilation, respiratory function stable and patient connected to nasal cannula oxygen Cardiovascular status: blood pressure returned to baseline and stable Postop Assessment: no signs of nausea or vomiting Anesthetic complications: no    Last Vitals:  Vitals:   06/01/16 1245 06/01/16 1300  BP: 120/76 126/81  Pulse: 91 91  Resp: 14 17  Temp:      Last Pain:  Vitals:   06/01/16 1245  TempSrc:   PainSc: 0-No pain                 Marcie Shearon,JAMES TERRILL

## 2016-06-01 NOTE — Op Note (Signed)
PATIENT:  Gabrielle Gallegos  PRE-OPERATIVE DIAGNOSIS: Bladder tumor  POST-OPERATIVE DIAGNOSIS: Same  PROCEDURE:  Procedure(s): 1. TRANSURETHRAL RESECTION OF BLADDER TUMOR (TURBT) (3 cm.) 2. Instillation of intravesical chemotherapy (mitomycin-C)  SURGEON:  Surgeon(s): Claybon Jabs  ANESTHESIA:   General  EBL:  less than 50 mL  DRAINS: Urethral catheter (18 Fr. Foley)   SPECIMEN:  Bladder tumor  DISPOSITION OF SPECIMEN:  PATHOLOGY  Indication:  [name] is a 60 year old female who was evaluated for hematuria and found to have no abnormality of the upper tract by CT scan but a large bladder tumor was identified at the time of cystoscopy. It was located on the left wall and floor of the bladder on the left-hand side just posterior to the left ureteral orifice. We discussed treatment and she presents today for transurethral resection of her bladder tumor and postoperative mitomycin-C instillation.  Description of operation: The patient was taken to the operating room and administered general anesthesia. They were then placed on the table and moved to the dorsal lithotomy position after which the genitalia was sterilely prepped and draped. An official timeout was then performed.  The 26 French resectoscope with the 30 lens and visual obturator were then passed into the bladder under direct visualization. Urethra appeared normal. The visual obturator was then removed and the Gyrus resectoscope element with 30  lens was then inserted and the bladder was fully and systematically inspected. Ureteral orifices were noted to be in the normal anatomic positions. The tumor was located on the floor of the bladder extending onto the left wall and posterior wall and had very long frondular protrusions. No other tumors were located within the bladder on full and systematic inspection.  I first began by resecting the tumor down to the wall of the bladder. All visible tumor was fully resected. I  fulgurated the area where I resected the tumor and then the surrounding mucosa. There was no bleeding. The Microvasive evacuator was then used to remove all portions of the tumor from the bladder.   Reinspection of the bladder revealed all obvious tumor had been fully resected and there was no evidence of perforation. I then removed the resectoscope.  An 10 French Foley catheter was then inserted in the bladder and irrigated. The irrigant returned clear with no clots. The patient was awakened and taken to the recovery room.  While in the recovery room 40 mg of mitomycin-C in 40 cc of water were instilled in the bladder through the catheter and the catheter was plugged. This will remain indwelling for approximately one hour. It will then be drained from the bladder and the catheter will be removed and the patient discharged home.  PLAN OF CARE: Discharge to home after PACU  PATIENT DISPOSITION:  PACU - hemodynamically stable.

## 2016-06-01 NOTE — Discharge Instructions (Signed)
Post Anesthesia Home Care Instructions  Activity: Get plenty of rest for the remainder of the day. A responsible adult should stay with you for 24 hours following the procedure.  For the next 24 hours, DO NOT: -Drive a car -Paediatric nurse -Drink alcoholic beverages -Take any medication unless instructed by your physician -Make any legal decisions or sign important papers.  Meals: Start with liquid foods such as gelatin or soup. Progress to regular foods as tolerated. Avoid greasy, spicy, heavy foods. If nausea and/or vomiting occur, drink only clear liquids until the nausea and/or vomiting subsides. Call your physician if vomiting continues.  Special Instructions/Symptoms: Your throat may feel dry or sore from the anesthesia or the breathing tube placed in your throat during surgery. If this causes discomfort, gargle with warm salt water. The discomfort should disappear within 24 hours.  If you had a scopolamine patch placed behind your ear for the management of post- operative nausea and/or vomiting:  1. The medication in the patch is effective for 72 hours, after which it should be removed.  Wrap patch in a tissue and discard in the trash. Wash hands thoroughly with soap and water. 2. You may remove the patch earlier than 72 hours if you experience unpleasant side effects which may include dry mouth, dizziness or visual disturbances. 3. Avoid touching the patch. Wash your hands with soap and water after contact with the patch.   Transurethral Resection of Bladder Tumor (TURBT)   Definition:  Transurethral Resection of the Bladder Tumor is a surgical procedure used to diagnose and remove tumors within the bladder. TURBT is the most common treatment for early stage bladder cancer.  General instructions:     Your recent bladder surgery requires very little post hospital care but some definite precautions.  Despite the fact that no skin incisions were used, the area around the  bladder incisions are raw and covered with scabs to promote healing and prevent bleeding. Certain precautions are needed to insure that the scabs are not disturbed over the next 2-4 weeks while the healing proceeds.  Because the raw surface inside your bladder and the irritating effects of urine you may expect frequency of urination and/or urgency (a stronger desire to urinate) and perhaps even getting up at night more often. This will usually resolve or improve slowly over the healing period. You may see some blood in your urine over the first 6 weeks. Do not be alarmed, even if the urine was clear for a while. Get off your feet and drink lots of fluids until clearing occurs. If you start to pass clots or don't improve call us.  Catheter: (If you are discharged with a catheter.)  1. Keep your catheter secured to your leg at all times with tape or the supplied strap. 2. You may experience leakage of urine around your catheter- as long as the  catheter continues to drain, this is normal.  If your catheter stops draining  go to the ER. 3. You may also have blood in your urine, even after it has been clear for  several days; you may even pass some small blood clots or other material.  This  is normal as well.  If this happens, sit down and drink plenty of water to help  make urine to flush out your bladder.  If the blood in your urine becomes worse  after doing this, contact our office or return to the ER. 4. You may use the leg bag (small bag) during  the day, but use the large bag at  night.  Diet:  You may return to your normal diet immediately. Because of the raw surface of your bladder, alcohol, spicy foods, foods high in acid and drinks with caffeine may cause irritation or frequency and should be used in moderation. To keep your urine flowing freely and avoid constipation, drink plenty of fluids during the day (8-10 glasses). Tip: Avoid cranberry juice because it is very  acidic.  Activity:  Your physical activity doesn't need to be restricted. However, if you are very active, you may see some blood in the urine. We suggest that you reduce your activity under the circumstances until the bleeding has stopped.  Bowels:  It is important to keep your bowels regular during the postoperative period. Straining with bowel movements can cause bleeding. A bowel movement every other day is reasonable. Use a mild laxative if needed, such as milk of magnesia 2-3 tablespoons, or 2 Dulcolax tablets. Call if you continue to have problems. If you had been taking narcotics for pain, before, during or after your surgery, you may be constipated. Take a laxative if necessary.    Medication:  You should resume your pre-surgery medications unless told not to. In addition you may be given an antibiotic to prevent or treat infection. Antibiotics are not always necessary. All medication should be taken as prescribed until the bottles are finished unless you are having an unusual reaction to one of the drugs.

## 2016-06-02 ENCOUNTER — Encounter (HOSPITAL_BASED_OUTPATIENT_CLINIC_OR_DEPARTMENT_OTHER): Payer: Self-pay | Admitting: Urology

## 2016-06-02 ENCOUNTER — Telehealth: Payer: Self-pay | Admitting: Family Medicine

## 2016-06-02 MED ORDER — BENZONATATE 200 MG PO CAPS: 200.0000 mg | ORAL_CAPSULE | Freq: Three times a day (TID) | ORAL | 1 refills | Status: DC | PRN

## 2016-06-02 NOTE — Telephone Encounter (Signed)
[place] Prescription sent to pharmacy

## 2016-06-02 NOTE — Telephone Encounter (Addendum)
Patient aware that medication has been sent to pharmacy 

## 2016-07-06 ENCOUNTER — Other Ambulatory Visit: Payer: Self-pay | Admitting: Family Medicine

## 2016-09-01 ENCOUNTER — Other Ambulatory Visit: Payer: Self-pay | Admitting: Family Medicine

## 2016-09-01 ENCOUNTER — Other Ambulatory Visit: Payer: Self-pay | Admitting: Family

## 2016-09-22 ENCOUNTER — Encounter: Payer: Self-pay | Admitting: Family Medicine

## 2016-09-22 ENCOUNTER — Ambulatory Visit (INDEPENDENT_AMBULATORY_CARE_PROVIDER_SITE_OTHER): Payer: Managed Care, Other (non HMO) | Admitting: Family Medicine

## 2016-09-22 ENCOUNTER — Ambulatory Visit: Payer: Managed Care, Other (non HMO) | Admitting: Family

## 2016-09-22 VITALS — BP 124/72 | HR 114 | Temp 96.9°F | Ht 64.0 in | Wt 150.0 lb

## 2016-09-22 DIAGNOSIS — L820 Inflamed seborrheic keratosis: Secondary | ICD-10-CM | POA: Diagnosis not present

## 2016-09-22 NOTE — Progress Notes (Signed)
Subjective:  Patient ID: Gabrielle Gallegos, female    DOB: 1955/09/23  Age: 61 y.o. MRN: [id number]  CC: Nevus (pt here today c/o ? mole on left hip that has been there for about a year and has gotten bigger and it is now painful)   HPI Gabrielle Gallegos presents for Removal of mole located at the left flank posteriorly. It has been itching and burning. Yesterday she scratched it and it started bleeding.  History Gabrielle Gallegos has a past medical history of Acute exacerbation of chronic obstructive pulmonary disease ([place]); Bladder tumor; COPD (chronic obstructive pulmonary disease) ([place]); Depression; Fatigue; GAD (generalized anxiety disorder); History of adenomatous polyp of colon; History of bladder stone; History of chronic bronchitis; History of kidney stones; History of panic attacks; Hyperlipidemia; Mixed stress and urge urinary incontinence; Nephrolithiasis; Neuropathy ([place]); Productive cough; Renal cyst, left; RLS (restless legs syndrome); Tachycardia, paroxysmal ([place]); Urgency of urination; and Wears glasses.   She has a past surgical history that includes Knee surgery (Left, 1976); Carpal tunnel release (Left, 1992); EXCISION GANGLION RIGHT LITTLE FINGER (03/23/2001); transthoracic echocardiogram (05/23/2014); Extracorporeal shock wave lithotripsy (2015  approx); Abdominal hysterectomy (1983); Knee arthroscopy (Left, 1994); Ulnar nerve repair (Left, 1994); ORIF GREAT TOE  (Left, 2007  approx); and Transurethral resection of bladder tumor with mitomycin-c (N/A, 06/01/2016).   Her family history includes Cancer in her father; Diabetes in her brother; Heart disease in her mother; Lung cancer in her sister.She reports that she has been smoking Cigarettes.  She has a 15.00 pack-year smoking history. She has never used smokeless tobacco. She reports that she does not drink alcohol or use drugs.  Current Outpatient Prescriptions on File Prior to Visit  Medication Sig Dispense Refill  . albuterol (PROVENTIL  HFA;VENTOLIN HFA) 108 (90 Base) MCG/ACT inhaler Inhale 2 puffs into the lungs every 6 (six) hours as needed for wheezing or shortness of breath. 1 Inhaler 0  . atorvastatin (LIPITOR) 40 MG tablet Take 1 tablet (40 mg total) by mouth daily. (Patient taking differently: Take 40 mg by mouth every evening. ) 90 tablet 2  . carvedilol (COREG) 6.25 MG tablet Take 1 tablet (6.25 mg total) by mouth 2 (two) times daily with a meal. 180 tablet 3  . clonazePAM (KLONOPIN) 0.5 MG tablet TAKE ONE TABLET BY MOUTH AT BEDTIME 30 tablet 2  . fenofibrate (TRICOR) 145 MG tablet Take 1 tablet (145 mg total) by mouth daily. (Patient taking differently: Take 145 mg by mouth every evening. ) 90 tablet 2  . gabapentin (NEURONTIN) 300 MG capsule TAKE ONE CAPSULE BY MOUTH THREE TIMES DAILY 90 capsule 0  . meloxicam (MOBIC) 15 MG tablet Take 1 tablet (15 mg total) by mouth daily. 90 tablet 2  . Phenylephrine-DM-GG-APAP (MUCINEX SINUS-MAX) 5-10-200-325 MG CAPS Take 2 capsules by mouth every 8 (eight) hours.    . pramipexole (MIRAPEX) 0.5 MG tablet TAKE ONE TABLET BY MOUTH ONCE DAILY AT BEDTIME AS NEEDED, MAY REPEAT 1 TIME IF NEEDED 40 tablet 5  . venlafaxine XR (EFFEXOR-XR) 150 MG 24 hr capsule Take 1 capsule (150 mg total) by mouth daily with breakfast. 90 capsule 1  . umeclidinium bromide (INCRUSE ELLIPTA) 62.5 MCG/INH AEPB Inhale 1 puff into the lungs daily. (Patient not taking: Reported on 09/22/2016) 30 each 2   No current facility-administered medications on file prior to visit.     ROS Review of Systems Unremarkable except as per history of present illness Objective:  BP 124/72   Pulse (!) 114  Temp (!) 96.9 F (36.1 C) (Oral)   Ht '5\' 4"'$  (1.626 m)   Wt 150 lb (68 kg)   BMI 25.75 kg/m   Physical Exam  There is a 6 mm x 15 mm seborrheic keratosis at the posterior superior iliac spine on the left. This has a bleeding upper margin. Assessment & Plan:   [name] was seen today for nevus.  Diagnoses and all  orders for this visit:  Seborrheic keratosis, inflamed    Liquid nitrogen cryotherapy applied in 2 sections for 40 seconds each. Wound care reviewed.   Follow-up: Return if symptoms worsen or fail to improve.  Claretta Fraise, M.D.

## 2016-09-26 ENCOUNTER — Other Ambulatory Visit: Payer: Self-pay | Admitting: Pediatrics

## 2016-09-30 ENCOUNTER — Telehealth: Payer: Self-pay | Admitting: Family Medicine

## 2016-09-30 ENCOUNTER — Other Ambulatory Visit: Payer: Self-pay | Admitting: Family Medicine

## 2016-09-30 MED ORDER — AMOXICILLIN 875 MG PO TABS: 875.0000 mg | ORAL_TABLET | Freq: Two times a day (BID) | ORAL | 0 refills | Status: AC

## 2016-09-30 NOTE — Telephone Encounter (Signed)
Please let the patient know that increasing redness and pain particularly of Compazine by swelling or drainage is a sign of possible infection. Sometimes redness that is not spreading is common when the blister first comes off. Since it has been several days I am concerned for infection and will send in an antibiotic. If it persists through the weekend I would like to take another look at it.

## 2016-09-30 NOTE — Telephone Encounter (Signed)
Patient had a mole froze 1/16. Patient states that at first it blistered for two days and then cleared up. Patient states that it is red and painful around the area with no drainage. Patient would Dr. Livia Snellen to know and would like to know how long Dr. Livia Snellen said it would take to heal. Please advise and send back to pools.

## 2016-10-01 NOTE — Telephone Encounter (Signed)
Pt notified of recommendation Will call back to schedule if sxs worsen or persist

## 2016-10-07 ENCOUNTER — Other Ambulatory Visit: Payer: Self-pay | Admitting: Family Medicine

## 2016-10-07 ENCOUNTER — Other Ambulatory Visit: Payer: Self-pay | Admitting: Family

## 2016-10-08 NOTE — Telephone Encounter (Signed)
Last seen by you on 05/27/16, last filled 09/07/16. Route to pool

## 2016-10-08 NOTE — Telephone Encounter (Signed)
Pt given appt tomorrow with Dr. Dettinger at 2:25 

## 2016-10-08 NOTE — Telephone Encounter (Signed)
She needs an appointment prior to refill.

## 2016-10-09 ENCOUNTER — Ambulatory Visit: Payer: Managed Care, Other (non HMO) | Admitting: Family Medicine

## 2016-10-15 ENCOUNTER — Encounter: Payer: Self-pay | Admitting: Family Medicine

## 2016-10-15 ENCOUNTER — Ambulatory Visit (INDEPENDENT_AMBULATORY_CARE_PROVIDER_SITE_OTHER): Payer: Managed Care, Other (non HMO) | Admitting: Family Medicine

## 2016-10-15 VITALS — BP 134/86 | HR 117 | Temp 97.3°F | Ht 64.0 in | Wt 145.2 lb

## 2016-10-15 DIAGNOSIS — G2581 Restless legs syndrome: Secondary | ICD-10-CM | POA: Diagnosis not present

## 2016-10-15 DIAGNOSIS — G43C1 Periodic headache syndromes in child or adult, intractable: Secondary | ICD-10-CM | POA: Diagnosis not present

## 2016-10-15 DIAGNOSIS — G629 Polyneuropathy, unspecified: Secondary | ICD-10-CM

## 2016-10-15 DIAGNOSIS — S21202S Unspecified open wound of left back wall of thorax without penetration into thoracic cavity, sequela: Secondary | ICD-10-CM | POA: Diagnosis not present

## 2016-10-15 DIAGNOSIS — I479 Paroxysmal tachycardia, unspecified: Secondary | ICD-10-CM

## 2016-10-15 DIAGNOSIS — J42 Unspecified chronic bronchitis: Secondary | ICD-10-CM

## 2016-10-15 DIAGNOSIS — F324 Major depressive disorder, single episode, in partial remission: Secondary | ICD-10-CM

## 2016-10-15 MED ORDER — TIOTROPIUM BROMIDE MONOHYDRATE 18 MCG IN CAPS: 18.0000 ug | ORAL_CAPSULE | Freq: Every day | RESPIRATORY_TRACT | 12 refills | Status: DC

## 2016-10-15 MED ORDER — CARVEDILOL 6.25 MG PO TABS: 6.2500 mg | ORAL_TABLET | Freq: Two times a day (BID) | ORAL | 3 refills | Status: DC

## 2016-10-15 MED ORDER — TRAZODONE HCL 50 MG PO TABS: 25.0000 mg | ORAL_TABLET | Freq: Every evening | ORAL | 3 refills | Status: DC | PRN

## 2016-10-15 MED ORDER — PRAMIPEXOLE DIHYDROCHLORIDE 0.5 MG PO TABS: ORAL_TABLET | ORAL | 5 refills | Status: DC

## 2016-10-15 MED ORDER — VENLAFAXINE HCL ER 150 MG PO CP24: 150.0000 mg | ORAL_CAPSULE | Freq: Every day | ORAL | 1 refills | Status: DC

## 2016-10-15 MED ORDER — ALBUTEROL SULFATE HFA 108 (90 BASE) MCG/ACT IN AERS: 2.0000 | INHALATION_SPRAY | Freq: Four times a day (QID) | RESPIRATORY_TRACT | 0 refills | Status: DC | PRN

## 2016-10-15 MED ORDER — GABAPENTIN 300 MG PO CAPS: 300.0000 mg | ORAL_CAPSULE | Freq: Three times a day (TID) | ORAL | 1 refills | Status: DC

## 2016-10-15 NOTE — Patient Instructions (Signed)
Left back wound, cover with Xeroform gauze and leave on for 12 hours and dry for 12 hours, return if not improved in 2 weeks or worsens.

## 2016-10-15 NOTE — Progress Notes (Signed)
BP 134/86   Pulse (!) 117   Temp 97.3 F (36.3 C) (Oral)   Ht '5\' 4"'$  (1.626 m)   Wt 145 lb 3.2 oz (65.9 kg)   BMI 24.92 kg/m    Subjective:    Patient ID: Gabrielle Gallegos, female    DOB: 29-Apr-1956, 61 y.o.   MRN: [id number]  HPI: Gabrielle Gallegos is a 61 y.o. female presenting on 10/15/2016 for Medication Refill and Seborrheic Keratosis (rck spot that was frozen on left side of back, which hurts)   HPI Wound on left back Patient comes in for follow-up on a wound on her left lower back from where she had a lesion removed by cryotherapy. Since that lesion was removed she's been having a lot of pains and the scabbing has thickened up in that region and catches on her close and is very painful. She denies any purulence or drainage or erythema or warmth to site the just the tenderness around where that scab is pulling in and healing.  Headaches and neuropathy and major depression Patient is coming in for recheck on her headaches and neuropathy major depression for which she is currently on Mirapex and Effexor. She feels like both are doing well for her and denies any major issues with them. She feels like her mood is doing very well since she's been on the 150 mg of Effexor. She also uses trazodone at night for bedtime and it works pretty good for her as well. She denies any suicidal ideations or thoughts of hurting herself. She denies any major side effects from medication.  COPD/breathing recheck and refill medications Patient needs a refill on medications for COPD and breathing issues and paroxysmal tachycardia. She denies any major issues with medications as they're working well for her. This was COPD she is not having use her albuterol inhaler very frequently and is doing well on her current maintenance medication. She denies any episodes of shortness of breath or wheezing and denies any nighttime coughing spells or wheezing that wakes her up.  Relevant past medical, surgical, family and  social history reviewed and updated as indicated. Interim medical history since our last visit reviewed. Allergies and medications reviewed and updated.  Review of Systems  Constitutional: Negative for chills and fever.  HENT: Negative for congestion, ear discharge and ear pain.   Eyes: Negative for redness and visual disturbance.  Respiratory: Negative for chest tightness and shortness of breath.   Cardiovascular: Negative for chest pain and leg swelling.  Genitourinary: Negative for difficulty urinating and dysuria.  Musculoskeletal: Negative for back pain and gait problem.  Skin: Positive for wound. Negative for rash.  Neurological: Positive for headaches. Negative for light-headedness.  Psychiatric/Behavioral: Positive for dysphoric mood. Negative for agitation, behavioral problems, self-injury, sleep disturbance and suicidal ideas. The patient is nervous/anxious.   All other systems reviewed and are negative.   Per HPI unless specifically indicated above     Objective:    BP 134/86   Pulse (!) 117   Temp 97.3 F (36.3 C) (Oral)   Ht '5\' 4"'$  (1.626 m)   Wt 145 lb 3.2 oz (65.9 kg)   BMI 24.92 kg/m   Wt Readings from Last 3 Encounters:  10/15/16 145 lb 3.2 oz (65.9 kg)  09/22/16 150 lb (68 kg)  06/01/16 147 lb (66.7 kg)    Physical Exam  Constitutional: She is oriented to person, place, and time. She appears well-developed and well-nourished. No distress.  Eyes: Conjunctivae are normal.  Neck: Neck supple. No thyromegaly present.  Cardiovascular: Normal rate, regular rhythm, normal heart sounds and intact distal pulses.   No murmur heard. Pulmonary/Chest: Effort normal and breath sounds normal. No respiratory distress. She has no wheezes.  Musculoskeletal: Normal range of motion. She exhibits no edema or tenderness.  Lymphadenopathy:    She has no cervical adenopathy.  Neurological: She is alert and oriented to person, place, and time. Coordination normal.  Skin: Skin is  warm and dry. Lesion (3 cm wide lesion on left lower back that is raised and scabbed, no signs of erythema or warmth.. Patient has tenderness to palpation and catches her shirt.) noted. No rash noted. She is not diaphoretic.  Psychiatric: Her behavior is normal. Judgment normal. Her mood appears anxious. She exhibits a depressed mood. She expresses no suicidal ideation. She expresses no suicidal plans.  Nursing note and vitals reviewed.     Assessment & Plan:   Problem List Items Addressed This Visit      Cardiovascular and Mediastinum   Migraines   Relevant Medications   gabapentin (NEURONTIN) 300 MG capsule   traZODone (DESYREL) 50 MG tablet   carvedilol (COREG) 6.25 MG tablet   venlafaxine XR (EFFEXOR-XR) 150 MG 24 hr capsule     Nervous and Auditory   Neuropathy (HCC)     Other   Depression - Primary   Relevant Medications   traZODone (DESYREL) 50 MG tablet   venlafaxine XR (EFFEXOR-XR) 150 MG 24 hr capsule   Restless leg syndrome    Other Visit Diagnoses    Chronic bronchitis, unspecified chronic bronchitis type (HCC)       Relevant Medications   albuterol (PROVENTIL HFA;VENTOLIN HFA) 108 (90 Base) MCG/ACT inhaler   Tachycardia, paroxysmal (HCC)       Relevant Medications   carvedilol (COREG) 6.25 MG tablet   Wound of left side of back, sequela       Left back wound, cover with Xeroform gauze and leave on for 12 hours and dry for 12 hours, return if not improved in 2 weeks or worsens.       Follow up plan: Return in about 3 months (around 01/12/2017), or if symptoms worsen or fail to improve, for Recheck depression and restless legs.  Counseling provided for all of the vaccine components No orders of the defined types were placed in this encounter.   Caryl Pina, MD [facility] 10/15/2016, 10:11 AM

## 2016-12-10 ENCOUNTER — Other Ambulatory Visit: Payer: Self-pay | Admitting: Family Medicine

## 2017-02-12 ENCOUNTER — Other Ambulatory Visit: Payer: Self-pay | Admitting: Family Medicine

## 2017-02-28 ENCOUNTER — Other Ambulatory Visit: Payer: Self-pay | Admitting: Family Medicine

## 2017-03-09 ENCOUNTER — Encounter: Payer: Self-pay | Admitting: Physician Assistant

## 2017-03-09 ENCOUNTER — Ambulatory Visit (INDEPENDENT_AMBULATORY_CARE_PROVIDER_SITE_OTHER): Payer: Managed Care, Other (non HMO) | Admitting: Physician Assistant

## 2017-03-09 VITALS — BP 140/91 | HR 113 | Temp 98.0°F | Ht 64.0 in | Wt 146.0 lb

## 2017-03-09 DIAGNOSIS — K2100 Gastro-esophageal reflux disease with esophagitis, without bleeding: Secondary | ICD-10-CM

## 2017-03-09 DIAGNOSIS — M549 Dorsalgia, unspecified: Secondary | ICD-10-CM

## 2017-03-09 LAB — URINALYSIS
Bilirubin, UA: NEGATIVE
Glucose, UA: NEGATIVE
Ketones, UA: NEGATIVE
Leukocytes, UA: NEGATIVE
Nitrite, UA: NEGATIVE
Protein, UA: NEGATIVE
RBC, UA: NEGATIVE
Specific Gravity, UA: 1.015 (ref 1.005–1.030)
Urobilinogen, Ur: 0.2 mg/dL (ref 0.2–1.0)
pH, UA: 7 (ref 5.0–7.5)

## 2017-03-09 MED ORDER — CYCLOBENZAPRINE HCL 10 MG PO TABS: 10.0000 mg | ORAL_TABLET | Freq: Three times a day (TID) | ORAL | 0 refills | Status: DC | PRN

## 2017-03-09 MED ORDER — HYDROCODONE-ACETAMINOPHEN 5-325 MG PO TABS: 1.0000 | ORAL_TABLET | Freq: Four times a day (QID) | ORAL | 0 refills | Status: DC | PRN

## 2017-03-09 MED ORDER — OMEPRAZOLE 20 MG PO CPDR: 20.0000 mg | DELAYED_RELEASE_CAPSULE | Freq: Every day | ORAL | 3 refills | Status: DC

## 2017-03-09 NOTE — Progress Notes (Signed)
BP (!) 140/91   Pulse (!) 113   Temp 98 F (36.7 C) (Oral)   Ht 5\' 4"  (1.626 m)   Wt 146 lb (66.2 kg)   BMI 25.06 kg/m    Subjective:    Patient ID: Gabrielle Gallegos, female    DOB: [birth year], 61 y.o.   MRN: [id number]  HPI: Gabrielle Gallegos is a 61 y.o. female presenting on 03/09/2017 for Back Pain  This patient has known bladder cancer. She has been clear for several visits. She has had increased back pain over the past few days. She has had a history of stones in the past. Her UA today was within normal limits and did not show any blood. She does have follow-up with her urologist on July 9. She is having significant swelling and bloating in her upper abdomen. She does take Mavik 15 mg. And have her stop that because of the fingers is irritating her stomach. She has never taken GERD medication up and have her start omeprazole to see if this can calm down her stomach. She will have increased pain in her back due to stopping her anti-inflammatory. Return to have her use of limited amount of hydrocodone if needed and Flexeril as much as possible. We'll plan to recheck her in 4 weeks.  Relevant past medical, surgical, family and social history reviewed and updated as indicated. Allergies and medications reviewed and updated.  Past Medical History:  Diagnosis Date  . Acute exacerbation of chronic obstructive pulmonary disease ([place])    05-27-2016 dx started on omnicef, oral prednisone  . Bladder tumor   . COPD (chronic obstructive pulmonary disease) ([place])   . Depression   . Fatigue   . GAD (generalized anxiety disorder)   . History of adenomatous polyp of colon    tubular adenoma's  10-30-2015  . History of bladder stone    01/ 2008  . History of chronic bronchitis    last bout 04-02-2016  . History of kidney stones   . History of panic attacks   . Hyperlipidemia   . Mixed stress and urge urinary incontinence   . Nephrolithiasis    bilateral nonobstructive per ct 05/ 2017  .  Neuropathy   . Productive cough   . Renal cyst, left   . RLS (restless legs syndrome)   . Tachycardia, paroxysmal [facility])    last cardiologist-  dr Mare Ferrari  09/ 2015  felt to stress/ anxiety related  . Urgency of urination   . Wears glasses     Past Surgical History:  Procedure Laterality Date  . ABDOMINAL HYSTERECTOMY  1983  . CARPAL TUNNEL RELEASE Left 1992  . EXCISION GANGLION RIGHT LITTLE FINGER  03/23/2001  . EXTRACORPOREAL SHOCK WAVE LITHOTRIPSY  2015  approx  . KNEE ARTHROSCOPY Left 1994  . KNEE SURGERY Left 1976  . ORIF GREAT TOE  Left 2007  approx  . TRANSTHORACIC ECHOCARDIOGRAM  05/23/2014   mild LVH,  ef 60-65%  . TRANSURETHRAL RESECTION OF BLADDER TUMOR WITH MITOMYCIN-C N/A 06/01/2016   Procedure: TRANSURETHRAL RESECTION OF BLADDER TUMOR WITH MITOMYCIN-C IN PACU POST OP;  Surgeon: Kathie Rhodes, MD;  Location: [facility];  Service: Urology;  Laterality: N/A;  . ULNAR NERVE REPAIR Left 1994    Review of Systems  Constitutional: Positive for fatigue. Negative for activity change and fever.  HENT: Negative.   Eyes: Negative.   Respiratory: Negative.  Negative for cough.   Cardiovascular: Negative.  Negative for chest pain.  Gastrointestinal: Positive for abdominal distention, abdominal pain and nausea.  Endocrine: Negative.   Genitourinary: Negative.  Negative for dysuria.  Musculoskeletal: Positive for back pain and gait problem.  Skin: Negative.     Allergies as of 03/09/2017      Reactions   Diltiazem Shortness Of Breath   , bloated   Metoprolol Nausea And Vomiting      Medication List       Accurate as of 03/09/17  4:35 PM. Always use your most recent med list.          albuterol 108 (90 Base) MCG/ACT inhaler Commonly known as:  PROVENTIL HFA;VENTOLIN HFA Inhale 2 puffs into the lungs every 6 (six) hours as needed for wheezing or shortness of breath.   benzonatate 200 MG capsule Commonly known as:  TESSALON TAKE ONE CAPSULE BY MOUTH  THREE TIMES DAILY AS NEEDED   carvedilol 6.25 MG tablet Commonly known as:  COREG Take 1 tablet (6.25 mg total) by mouth 2 (two) times daily with a meal.   cyclobenzaprine 10 MG tablet Commonly known as:  FLEXERIL Take 1 tablet (10 mg total) by mouth 3 (three) times daily as needed for muscle spasms.   fenofibrate 145 MG tablet Commonly known as:  TRICOR Take 1 tablet (145 mg total) by mouth daily.   gabapentin 300 MG capsule Commonly known as:  NEURONTIN TAKE 1 CAPSULE BY MOUTH THREE TIMES DAILY   HYDROcodone-acetaminophen 5-325 MG tablet Commonly known as:  NORCO/VICODIN Take 1 tablet by mouth every 6 (six) hours as needed for moderate pain.   [place] SINUS-MAX 5-10-200-325 MG Caps Generic drug:  Phenylephrine-DM-GG-APAP Take 2 capsules by mouth every 8 (eight) hours.   omeprazole 20 MG capsule Commonly known as:  PRILOSEC Take 1 capsule (20 mg total) by mouth daily.   pramipexole 0.5 MG tablet Commonly known as:  MIRAPEX TAKE ONE TABLET BY MOUTH ONCE DAILY AT BEDTIME AS NEEDED, MAY REPEAT 1 TIME IF NEEDED   tiotropium 18 MCG inhalation capsule Commonly known as:  SPIRIVA HANDIHALER Place 1 capsule (18 mcg total) into inhaler and inhale daily.   traZODone 50 MG tablet Commonly known as:  DESYREL TAKE 1/2 TO 1 (ONE-HALF TO ONE) TABLET BY MOUTH AT BEDTIME AS NEEDED FOR SLEEP   venlafaxine XR 150 MG 24 hr capsule Commonly known as:  EFFEXOR-XR Take 1 capsule (150 mg total) by mouth daily with breakfast.          Objective:    BP (!) 140/91   Pulse (!) 113   Temp 98 F (36.7 C) (Oral)   Ht 5\' 4"  (1.626 m)   Wt 146 lb (66.2 kg)   BMI 25.06 kg/m   Allergies  Allergen Reactions  . Diltiazem Shortness Of Breath    , bloated  . Metoprolol Nausea And Vomiting    Physical Exam  Constitutional: She is oriented to person, place, and time. She appears well-developed and well-nourished.  HENT:  Head: Normocephalic and atraumatic.  Right Ear: Tympanic  membrane, external ear and ear canal normal.  Left Ear: Tympanic membrane, external ear and ear canal normal.  Nose: Nose normal. No rhinorrhea.  Mouth/Throat: Oropharynx is clear and moist and mucous membranes are normal. No oropharyngeal exudate or posterior oropharyngeal erythema.  Eyes: Conjunctivae and EOM are normal. Pupils are equal, round, and reactive to light.  Neck: Normal range of motion. Neck supple.  Cardiovascular: Normal rate, regular rhythm, normal heart sounds and intact distal pulses.   Pulmonary/Chest: Effort normal  and breath sounds normal.  Abdominal: Soft. Bowel sounds are normal. She exhibits distension. She exhibits no mass. There is tenderness. There is guarding. There is no rebound.  Musculoskeletal:       Lumbar back: She exhibits decreased range of motion, tenderness, pain and spasm.  Neurological: She is alert and oriented to person, place, and time. She has normal reflexes.  Skin: Skin is warm and dry. No rash noted.  Psychiatric: She has a normal mood and affect. Her behavior is normal. Judgment and thought content normal.    Results for orders placed or performed in visit on 03/09/17  Urinalysis  Result Value Ref Range   Specific Gravity, UA 1.015 1.005 - 1.030   pH, UA 7.0 5.0 - 7.5   Color, UA Yellow Yellow   Appearance Ur Clear Clear   Leukocytes, UA Negative Negative   Protein, UA Negative Negative/Trace   Glucose, UA Negative Negative   Ketones, UA Negative Negative   RBC, UA Negative Negative   Bilirubin, UA Negative Negative   Urobilinogen, Ur 0.2 0.2 - 1.0 mg/dL   Nitrite, UA Negative Negative      Assessment & Plan:   1. Acute back pain, unspecified back location, unspecified back pain laterality - Urinalysis - cyclobenzaprine (FLEXERIL) 10 MG tablet; Take 1 tablet (10 mg total) by mouth 3 (three) times daily as needed for muscle spasms.  Dispense: 90 tablet; Refill: 0 - HYDROcodone-acetaminophen (NORCO/VICODIN) 5-325 MG tablet; Take 1  tablet by mouth every 6 (six) hours as needed for moderate pain.  Dispense: 60 tablet; Refill: 0  2. Gastroesophageal reflux disease with esophagitis - omeprazole (PRILOSEC) 20 MG capsule; Take 1 capsule (20 mg total) by mouth daily.  Dispense: 30 capsule; Refill: 3   Current Outpatient Prescriptions:  .  albuterol (PROVENTIL HFA;VENTOLIN HFA) 108 (90 Base) MCG/ACT inhaler, Inhale 2 puffs into the lungs every 6 (six) hours as needed for wheezing or shortness of breath., Disp: 1 Inhaler, Rfl: 0 .  benzonatate (TESSALON) 200 MG capsule, TAKE ONE CAPSULE BY MOUTH THREE TIMES DAILY AS NEEDED, Disp: 30 capsule, Rfl: 0 .  carvedilol (COREG) 6.25 MG tablet, Take 1 tablet (6.25 mg total) by mouth 2 (two) times daily with a meal., Disp: 180 tablet, Rfl: 3 .  fenofibrate (TRICOR) 145 MG tablet, Take 1 tablet (145 mg total) by mouth daily. (Patient taking differently: Take 145 mg by mouth every evening. ), Disp: 90 tablet, Rfl: 2 .  gabapentin (NEURONTIN) 300 MG capsule, TAKE 1 CAPSULE BY MOUTH THREE TIMES DAILY, Disp: 90 capsule, Rfl: 0 .  Phenylephrine-DM-GG-APAP (MUCINEX SINUS-MAX) 5-10-200-325 MG CAPS, Take 2 capsules by mouth every 8 (eight) hours., Disp: , Rfl:  .  pramipexole (MIRAPEX) 0.5 MG tablet, TAKE ONE TABLET BY MOUTH ONCE DAILY AT BEDTIME AS NEEDED, MAY REPEAT 1 TIME IF NEEDED, Disp: 40 tablet, Rfl: 5 .  tiotropium (SPIRIVA HANDIHALER) 18 MCG inhalation capsule, Place 1 capsule (18 mcg total) into inhaler and inhale daily., Disp: 30 capsule, Rfl: 12 .  traZODone (DESYREL) 50 MG tablet, TAKE 1/2 TO 1 (ONE-HALF TO ONE) TABLET BY MOUTH AT BEDTIME AS NEEDED FOR SLEEP, Disp: 30 tablet, Rfl: 3 .  venlafaxine XR (EFFEXOR-XR) 150 MG 24 hr capsule, Take 1 capsule (150 mg total) by mouth daily with breakfast., Disp: 90 capsule, Rfl: 1 .  cyclobenzaprine (FLEXERIL) 10 MG tablet, Take 1 tablet (10 mg total) by mouth 3 (three) times daily as needed for muscle spasms., Disp: 90 tablet, Rfl: 0 .  HYDROcodone-acetaminophen (NORCO/VICODIN) 5-325 MG tablet, Take 1 tablet by mouth every 6 (six) hours as needed for moderate pain., Disp: 60 tablet, Rfl: 0 .  omeprazole (PRILOSEC) 20 MG capsule, Take 1 capsule (20 mg total) by mouth daily., Disp: 30 capsule, Rfl: 3  Continue all other maintenance medications as listed above.  Follow up plan: Return in about 2 weeks (around 03/23/2017) for recheck.  Educational handout given for [place] PA-C [place] [address]  [place], [place] [address] [phone]   03/09/2017, 4:35 PM

## 2017-03-09 NOTE — Patient Instructions (Signed)
Scoliosis Scoliosis is the name given to a spine that curves sideways.Scoliosis can cause twisting of your shoulders, hips, chest, back, and rib cage. What are the causes? The cause of scoliosis is not always known. It may be caused by a birth defect or by a disease that can cause muscular dysfunction and imbalance, such as cerebral palsy and muscular dystrophy. What increases the risk? Having a disease that causes muscle disease or dysfunction. What are the signs or symptoms? Scoliosis often has no signs or symptoms.If they are present, they may include:  Unequal size of one body side compared to the other (asymmetry).  Visible curvature of the spine.  Pain. The pain may limit physical activity.  Shortness of breath.  Bowel or bladder issues.  How is this diagnosed? A skilled health care provider will perform an evaluation. This will involve:  Taking your history.  Performing a physical examination.  Performing a neurological exam to detect nerve or muscle function loss.  Range of motion studies on the spine.  X-rays.  An MRI may also be obtained. How is this treated? Treatment varies depending on the nature, extent, and severity of the disease. If the curvature is not great, you may need only observation. A brace may be used to prevent scoliosis from progressing. A brace may also be needed during growth spurts. Physical therapy may be of benefit. Surgery may be required. Follow these instructions at home:  Your health care provider may suggest exercises to strengthen your muscles. Perform them as directed.  Ask your health care provider before participating in any sports.  If you have been prescribed an orthopedic brace, wear it as instructed by your health care provider. Contact a health care provider if: Your brace causes the skin to become sore (chafe) or is uncomfortable. Get help right away if:  You have back pain that is not relieved by the medicines prescribed  by your health care provider.  Your legs feel weak or you lose function in your legs.  You lose some bowel or bladder control. This information is not intended to replace advice given to you by your health care provider. Make sure you discuss any questions you have with your health care provider. Document Released: 08/21/2000 Document Revised: 01/30/2016 Document Reviewed: 02/27/2016 Elsevier Interactive Patient Education  [employer].

## 2017-03-15 ENCOUNTER — Telehealth: Payer: Self-pay | Admitting: Physician Assistant

## 2017-03-15 DIAGNOSIS — M549 Dorsalgia, unspecified: Secondary | ICD-10-CM

## 2017-03-16 NOTE — Telephone Encounter (Signed)
[facility] new policy with opiates- they filled #20 5 days and now she needs a new RX for the other 40 tablets

## 2017-03-17 MED ORDER — HYDROCODONE-ACETAMINOPHEN 5-325 MG PO TABS: 1.0000 | ORAL_TABLET | Freq: Four times a day (QID) | ORAL | 0 refills | Status: DC | PRN

## 2017-03-17 NOTE — Telephone Encounter (Signed)
Find out?

## 2017-03-17 NOTE — Telephone Encounter (Signed)
[facility] will only fill RXs for 30 day supply for chronic pain Please do new RX to include DX for chronic pain

## 2017-03-17 NOTE — Telephone Encounter (Signed)
Please forward to [facility] because she is the one that prescribed it. To see what she wants to do about it

## 2017-03-17 NOTE — Telephone Encounter (Signed)
Rx upfront to be picked up.

## 2017-03-18 NOTE — Telephone Encounter (Signed)
Pt.notified

## 2017-04-11 ENCOUNTER — Other Ambulatory Visit: Payer: Self-pay | Admitting: Physician Assistant

## 2017-04-11 ENCOUNTER — Other Ambulatory Visit: Payer: Self-pay | Admitting: Family Medicine

## 2017-04-11 DIAGNOSIS — M549 Dorsalgia, unspecified: Secondary | ICD-10-CM

## 2017-04-12 ENCOUNTER — Ambulatory Visit (INDEPENDENT_AMBULATORY_CARE_PROVIDER_SITE_OTHER): Payer: Managed Care, Other (non HMO) | Admitting: Physician Assistant

## 2017-04-12 ENCOUNTER — Encounter: Payer: Self-pay | Admitting: Physician Assistant

## 2017-04-12 VITALS — BP 158/103 | HR 120 | Temp 97.6°F | Ht 64.0 in | Wt 144.0 lb

## 2017-04-12 DIAGNOSIS — M4120 Other idiopathic scoliosis, site unspecified: Secondary | ICD-10-CM

## 2017-04-12 DIAGNOSIS — F411 Generalized anxiety disorder: Secondary | ICD-10-CM

## 2017-04-12 DIAGNOSIS — I10 Essential (primary) hypertension: Secondary | ICD-10-CM | POA: Diagnosis not present

## 2017-04-12 DIAGNOSIS — G629 Polyneuropathy, unspecified: Secondary | ICD-10-CM

## 2017-04-12 DIAGNOSIS — I479 Paroxysmal tachycardia, unspecified: Secondary | ICD-10-CM | POA: Diagnosis not present

## 2017-04-12 DIAGNOSIS — G2581 Restless legs syndrome: Secondary | ICD-10-CM | POA: Diagnosis not present

## 2017-04-12 MED ORDER — ALPRAZOLAM 0.5 MG PO TABS: 0.5000 mg | ORAL_TABLET | Freq: Every evening | ORAL | 0 refills | Status: DC | PRN

## 2017-04-12 MED ORDER — ALPRAZOLAM 0.5 MG PO TBDP: 0.5000 mg | ORAL_TABLET | Freq: Two times a day (BID) | ORAL | 0 refills | Status: DC | PRN

## 2017-04-12 MED ORDER — PRAMIPEXOLE DIHYDROCHLORIDE 0.5 MG PO TABS: ORAL_TABLET | ORAL | 5 refills | Status: DC

## 2017-04-12 MED ORDER — DULOXETINE HCL 60 MG PO CPEP: 60.0000 mg | ORAL_CAPSULE | Freq: Every day | ORAL | 1 refills | Status: DC

## 2017-04-12 MED ORDER — CARVEDILOL 12.5 MG PO TABS: 6.2500 mg | ORAL_TABLET | Freq: Two times a day (BID) | ORAL | 3 refills | Status: DC

## 2017-04-12 NOTE — Patient Instructions (Signed)
In a few days you may receive a survey in the mail or online from [employer] regarding your visit with us today. Please take a moment to fill this out. Your feedback is very important to our whole office. It can help us better understand your needs as well as improve your experience and satisfaction. Thank you for taking your time to complete it. We care about you.  Quinton Voth, PA-C  

## 2017-04-13 DIAGNOSIS — F411 Generalized anxiety disorder: Secondary | ICD-10-CM | POA: Insufficient documentation

## 2017-04-13 NOTE — Progress Notes (Signed)
BP (!) 158/103   Pulse (!) 120   Temp 97.6 F (36.4 C) (Oral)   Ht 5\' 4"  (1.626 m)   Wt 144 lb (65.3 kg)   BMI 24.72 kg/m    Subjective:    Patient ID: Gabrielle Gallegos, female    DOB: 11-09-1955, 61 y.o.   MRN: [id number]  HPI: Gabrielle Gallegos is a 61 y.o. female presenting on 04/12/2017 for Back Pain and upper abdominal pain  This patient comes in for periodic recheck on medications and conditions including chronic pain due to severe scoliosis, chronic neuropathy, restless leg syndrome, hypertension, generalized anxiety, paroxysmal tachycardia. She has taken a beta blocker for many years related to her tachycardia. In the past checks she normally runs around 100 and does not feel very symptomatic. However in the last few days it has been in the 120s. She can tell a difference. Her blood pressure is also elevated. She has had a increase in her back pain going up her back. She had her gallbladder checked in the past. With an ultrasound. No HIDA scan has been performed. She is not describing classic gallbladder symptoms. She is having also increased anxiety related to family and life situations..   All medications are reviewed today. There are no reports of any problems with the medications. All of the medical conditions are reviewed and updated.  Lab work is reviewed and will be ordered as medically necessary. There are no new problems reported with today's visit.   Relevant past medical, surgical, family and social history reviewed and updated as indicated. Allergies and medications reviewed and updated.  Past Medical History:  Diagnosis Date  . Acute exacerbation of chronic obstructive pulmonary disease ([place])    05-27-2016 dx started on omnicef, oral prednisone  . Bladder tumor   . COPD (chronic obstructive pulmonary disease) ([place])   . Depression   . Fatigue   . GAD (generalized anxiety disorder)   . History of adenomatous polyp of colon    tubular adenoma's  10-30-2015  . History  of bladder stone    01/ 2008  . History of chronic bronchitis    last bout 04-02-2016  . History of kidney stones   . History of panic attacks   . Hyperlipidemia   . Mixed stress and urge urinary incontinence   . Nephrolithiasis    bilateral nonobstructive per ct 05/ 2017  . Neuropathy   . Productive cough   . Renal cyst, left   . RLS (restless legs syndrome)   . Tachycardia, paroxysmal [facility])    last cardiologist-  dr Mare Ferrari  09/ 2015  felt to stress/ anxiety related  . Urgency of urination   . Wears glasses     Past Surgical History:  Procedure Laterality Date  . ABDOMINAL HYSTERECTOMY  1983  . CARPAL TUNNEL RELEASE Left 1992  . EXCISION GANGLION RIGHT LITTLE FINGER  03/23/2001  . EXTRACORPOREAL SHOCK WAVE LITHOTRIPSY  2015  approx  . KNEE ARTHROSCOPY Left 1994  . KNEE SURGERY Left 1976  . ORIF GREAT TOE  Left 2007  approx  . TRANSTHORACIC ECHOCARDIOGRAM  05/23/2014   mild LVH,  ef 60-65%  . TRANSURETHRAL RESECTION OF BLADDER TUMOR WITH MITOMYCIN-C N/A 06/01/2016   Procedure: TRANSURETHRAL RESECTION OF BLADDER TUMOR WITH MITOMYCIN-C IN PACU POST OP;  Surgeon: Kathie Rhodes, MD;  Location: [facility];  Service: Urology;  Laterality: N/A;  . ULNAR NERVE REPAIR Left 1994    Review of Systems  Constitutional:  Negative for activity change, fatigue and fever.  HENT: Negative.   Eyes: Negative.   Respiratory: Negative.  Negative for cough, shortness of breath and wheezing.   Cardiovascular: Positive for palpitations. Negative for chest pain and leg swelling.  Gastrointestinal: Negative.  Negative for abdominal distention and abdominal pain.  Endocrine: Negative.   Genitourinary: Negative.  Negative for dysuria.  Musculoskeletal: Positive for arthralgias, back pain, gait problem and myalgias.  Skin: Negative.   Neurological: Positive for weakness. Negative for dizziness.    Allergies as of 04/12/2017      Reactions   Diltiazem Shortness Of Breath   ,  bloated   Metoprolol Nausea And Vomiting      Medication List       Accurate as of 04/12/17 11:59 PM. Always use your most recent med list.          albuterol 108 (90 Base) MCG/ACT inhaler Commonly known as:  PROVENTIL HFA;VENTOLIN HFA Inhale 2 puffs into the lungs every 6 (six) hours as needed for wheezing or shortness of breath.   ALPRAZolam 0.5 MG tablet Commonly known as:  XANAX Take 1 tablet (0.5 mg total) by mouth at bedtime as needed for anxiety.   carvedilol 12.5 MG tablet Commonly known as:  COREG Take 0.5 tablets (6.25 mg total) by mouth 2 (two) times daily with a meal.   cyclobenzaprine 10 MG tablet Commonly known as:  FLEXERIL TAKE 1 TABLET BY MOUTH THREE TIMES DAILY AS NEEDED FOR MUSCLE SPASMS   DULoxetine 60 MG capsule Commonly known as:  CYMBALTA Take 1 capsule (60 mg total) by mouth daily.   gabapentin 300 MG capsule Commonly known as:  NEURONTIN TAKE 1 CAPSULE BY MOUTH THREE TIMES DAILY   HYDROcodone-acetaminophen 5-325 MG tablet Commonly known as:  NORCO/VICODIN Take 1 tablet by mouth every 6 (six) hours as needed for moderate pain. Chronic pain   omeprazole 20 MG capsule Commonly known as:  PRILOSEC Take 1 capsule (20 mg total) by mouth daily.   pramipexole 0.5 MG tablet Commonly known as:  MIRAPEX TAKE ONE TABLET BY MOUTH ONCE DAILY AT BEDTIME AS NEEDED, MAY REPEAT 1 TIME IF NEEDED   tiotropium 18 MCG inhalation capsule Commonly known as:  SPIRIVA HANDIHALER Place 1 capsule (18 mcg total) into inhaler and inhale daily.   traZODone 50 MG tablet Commonly known as:  DESYREL TAKE 1/2 TO 1 (ONE-HALF TO ONE) TABLET BY MOUTH AT BEDTIME AS NEEDED FOR SLEEP   venlafaxine XR 150 MG 24 hr capsule Commonly known as:  EFFEXOR-XR Take 1 capsule (150 mg total) by mouth daily with breakfast.          Objective:    BP (!) 158/103   Pulse (!) 120   Temp 97.6 F (36.4 C) (Oral)   Ht 5\' 4"  (1.626 m)   Wt 144 lb (65.3 kg)   BMI 24.72 kg/m     Allergies  Allergen Reactions  . Diltiazem Shortness Of Breath    , bloated  . Metoprolol Nausea And Vomiting    Physical Exam  Constitutional: She is oriented to person, place, and time. She appears well-developed and well-nourished.  HENT:  Head: Normocephalic and atraumatic.  Right Ear: Tympanic membrane, external ear and ear canal normal.  Left Ear: Tympanic membrane, external ear and ear canal normal.  Nose: Nose normal. No rhinorrhea.  Mouth/Throat: Oropharynx is clear and moist and mucous membranes are normal. No oropharyngeal exudate or posterior oropharyngeal erythema.  Eyes: Pupils are equal, round, and  reactive to light. Conjunctivae and EOM are normal.  Neck: Normal range of motion. Neck supple.  Cardiovascular: Regular rhythm, S1 normal, S2 normal, normal heart sounds and intact distal pulses.  Tachycardia present.   Pulmonary/Chest: Effort normal and breath sounds normal.  Abdominal: Soft. Bowel sounds are normal.  Neurological: She is alert and oriented to person, place, and time. She has normal reflexes.  Skin: Skin is warm and dry. No rash noted.  Psychiatric: Her behavior is normal. Judgment and thought content normal. Her mood appears anxious.        Assessment & Plan:   1. Tachycardia, paroxysmal (HCC) - carvedilol (COREG) 12.5 MG tablet; Take 0.5 tablets (6.25 mg total) by mouth 2 (two) times daily with a meal.  Dispense: 180 tablet; Refill: 3  2. Scoliosis (and kyphoscoliosis), idiopathic  3. Neuropathy  4. Essential hypertension  5. Restless leg syndrome - pramipexole (MIRAPEX) 0.5 MG tablet; TAKE ONE TABLET BY MOUTH ONCE DAILY AT BEDTIME AS NEEDED, MAY REPEAT 1 TIME IF NEEDED  Dispense: 40 tablet; Refill: 5  6. GAD (generalized anxiety disorder) - DULoxetine (CYMBALTA) 60 MG capsule; Take 1 capsule (60 mg total) by mouth daily.  Dispense: 90 capsule; Refill: 1 - ALPRAZolam (XANAX) 0.5 MG tablet; Take 1 tablet (0.5 mg total) by mouth at bedtime as  needed for anxiety.  Dispense: 30 tablet; Refill: 0   Current Outpatient Prescriptions:  .  albuterol (PROVENTIL HFA;VENTOLIN HFA) 108 (90 Base) MCG/ACT inhaler, Inhale 2 puffs into the lungs every 6 (six) hours as needed for wheezing or shortness of breath., Disp: 1 Inhaler, Rfl: 0 .  carvedilol (COREG) 12.5 MG tablet, Take 0.5 tablets (6.25 mg total) by mouth 2 (two) times daily with a meal., Disp: 180 tablet, Rfl: 3 .  cyclobenzaprine (FLEXERIL) 10 MG tablet, TAKE 1 TABLET BY MOUTH THREE TIMES DAILY AS NEEDED FOR MUSCLE SPASMS, Disp: 90 tablet, Rfl: 1 .  gabapentin (NEURONTIN) 300 MG capsule, TAKE 1 CAPSULE BY MOUTH THREE TIMES DAILY, Disp: 90 capsule, Rfl: 1 .  omeprazole (PRILOSEC) 20 MG capsule, Take 1 capsule (20 mg total) by mouth daily., Disp: 30 capsule, Rfl: 3 .  pramipexole (MIRAPEX) 0.5 MG tablet, TAKE ONE TABLET BY MOUTH ONCE DAILY AT BEDTIME AS NEEDED, MAY REPEAT 1 TIME IF NEEDED, Disp: 40 tablet, Rfl: 5 .  tiotropium (SPIRIVA HANDIHALER) 18 MCG inhalation capsule, Place 1 capsule (18 mcg total) into inhaler and inhale daily., Disp: 30 capsule, Rfl: 12 .  traZODone (DESYREL) 50 MG tablet, TAKE 1/2 TO 1 (ONE-HALF TO ONE) TABLET BY MOUTH AT BEDTIME AS NEEDED FOR SLEEP, Disp: 30 tablet, Rfl: 3 .  venlafaxine XR (EFFEXOR-XR) 150 MG 24 hr capsule, Take 1 capsule (150 mg total) by mouth daily with breakfast., Disp: 90 capsule, Rfl: 1 .  ALPRAZolam (XANAX) 0.5 MG tablet, Take 1 tablet (0.5 mg total) by mouth at bedtime as needed for anxiety., Disp: 30 tablet, Rfl: 0 .  DULoxetine (CYMBALTA) 60 MG capsule, Take 1 capsule (60 mg total) by mouth daily., Disp: 90 capsule, Rfl: 1 .  HYDROcodone-acetaminophen (NORCO/VICODIN) 5-325 MG tablet, Take 1 tablet by mouth every 6 (six) hours as needed for moderate pain. Chronic pain (Patient not taking: Reported on 04/12/2017), Disp: 40 tablet, Rfl: 0  Continue all other maintenance medications as listed above.  Follow up plan: Return in about 4 weeks  (around 05/10/2017) for recheck.  Educational handout given for [place] PA-C [place] [address]  [place], [place] [address] [phone]   04/13/2017, 9:44 AM

## 2017-04-21 ENCOUNTER — Telehealth: Payer: Self-pay | Admitting: Physician Assistant

## 2017-04-21 DIAGNOSIS — I479 Paroxysmal tachycardia, unspecified: Secondary | ICD-10-CM

## 2017-04-21 MED ORDER — CARVEDILOL 12.5 MG PO TABS: 12.5000 mg | ORAL_TABLET | Freq: Two times a day (BID) | ORAL | 3 refills | Status: DC

## 2017-04-21 NOTE — Telephone Encounter (Signed)
Pt notified of RX 

## 2017-06-10 ENCOUNTER — Other Ambulatory Visit: Payer: Self-pay | Admitting: Family Medicine

## 2017-06-10 ENCOUNTER — Other Ambulatory Visit: Payer: Self-pay | Admitting: Physician Assistant

## 2017-06-10 DIAGNOSIS — M549 Dorsalgia, unspecified: Secondary | ICD-10-CM

## 2017-06-10 NOTE — Telephone Encounter (Signed)
Last seen 04/12/17 [facility]  Dr Dettinger PCP

## 2017-07-13 ENCOUNTER — Other Ambulatory Visit: Payer: Self-pay | Admitting: Physician Assistant

## 2017-07-13 ENCOUNTER — Other Ambulatory Visit: Payer: Self-pay | Admitting: Family Medicine

## 2017-07-13 DIAGNOSIS — K2100 Gastro-esophageal reflux disease with esophagitis, without bleeding: Secondary | ICD-10-CM

## 2017-07-13 DIAGNOSIS — M549 Dorsalgia, unspecified: Secondary | ICD-10-CM

## 2017-07-20 ENCOUNTER — Other Ambulatory Visit: Payer: Self-pay | Admitting: Family Medicine

## 2017-07-20 DIAGNOSIS — M549 Dorsalgia, unspecified: Secondary | ICD-10-CM

## 2017-08-05 ENCOUNTER — Other Ambulatory Visit: Payer: Self-pay | Admitting: Physician Assistant

## 2017-08-05 ENCOUNTER — Other Ambulatory Visit: Payer: Self-pay | Admitting: Family Medicine

## 2017-08-05 DIAGNOSIS — K2100 Gastro-esophageal reflux disease with esophagitis, without bleeding: Secondary | ICD-10-CM

## 2017-08-06 NOTE — Telephone Encounter (Signed)
Last seen 04/12/17  Dr Dettinger

## 2017-08-25 ENCOUNTER — Encounter: Payer: Self-pay | Admitting: Pediatrics

## 2017-08-25 ENCOUNTER — Ambulatory Visit (INDEPENDENT_AMBULATORY_CARE_PROVIDER_SITE_OTHER): Payer: Managed Care, Other (non HMO) | Admitting: Pediatrics

## 2017-08-25 ENCOUNTER — Telehealth: Payer: Self-pay

## 2017-08-25 VITALS — BP 157/108 | HR 118 | Temp 97.7°F | Ht 64.0 in | Wt 137.0 lb

## 2017-08-25 DIAGNOSIS — R000 Tachycardia, unspecified: Secondary | ICD-10-CM

## 2017-08-25 DIAGNOSIS — I10 Essential (primary) hypertension: Secondary | ICD-10-CM

## 2017-08-25 DIAGNOSIS — I479 Paroxysmal tachycardia, unspecified: Secondary | ICD-10-CM | POA: Diagnosis not present

## 2017-08-25 DIAGNOSIS — F411 Generalized anxiety disorder: Secondary | ICD-10-CM

## 2017-08-25 DIAGNOSIS — F410 Panic disorder [episodic paroxysmal anxiety] without agoraphobia: Secondary | ICD-10-CM

## 2017-08-25 MED ORDER — HYDROXYZINE HCL 10 MG PO TABS: 10.0000 mg | ORAL_TABLET | Freq: Three times a day (TID) | ORAL | 2 refills | Status: DC | PRN

## 2017-08-25 MED ORDER — CARVEDILOL 25 MG PO TABS: 25.0000 mg | ORAL_TABLET | Freq: Two times a day (BID) | ORAL | 2 refills | Status: DC

## 2017-08-25 NOTE — Progress Notes (Signed)
Subjective:   Patient ID: Gabrielle Gallegos, female    DOB: Feb 28, 1956, 61 y.o.   MRN: [id number] CC: Hypertension and Tachycardia  HPI: Gabrielle Gallegos is a 61 y.o. female presenting for Hypertension and Tachycardia  Here today because her heart rate was 124 at home yesterday 128 this morning Blood pressure at home this morning 156/119  No swelling  Two sisters, father died of cancer, multiple other stressors in her life at home No chest pain No shortness of breath Ongoing anxiety right now Smoking daily, not interested in cessation Mood has been fine, anxiety and stress bothering her the most of her home circumstances  Depression screen [facility] 2/9 08/25/2017 04/12/2017 03/09/2017 10/15/2016 09/22/2016  Decreased Interest 0 _0 0  Down, Depressed, Hopeless 0 0 0 3 0  PHQ - 2 Score 0 _1 0  Altered sleeping - 2 - 3 -  Tired, decreased energy - 3 - 3 -  Change in appetite - 2 - 2 -  Feeling bad or failure about yourself  - 0 - 0 -  Trouble concentrating - 0 - 0 -  Moving slowly or fidgety/restless - 0 - 0 -  Suicidal thoughts - 0 - 0 -  PHQ-9 Score - 10 - 14 -     Relevant past medical, surgical, family and social history reviewed. Allergies and medications reviewed and updated. Social History   Tobacco Use  Smoking Status Current Every Day Smoker  . Packs/day: 0.50  . Years: 30.00  . Pack years: 15.00  . Types: Cigarettes  Smokeless Tobacco Never Used   ROS: Per HPI   Objective:    BP (!) 157/108   Pulse (!) 118   Temp 97.7 F (36.5 C) (Oral)   Ht 5' 4" (1.626 m)   Wt 137 lb (62.1 kg)   BMI 23.52 kg/m   Wt Readings from Last 3 Encounters:  08/25/17 137 lb (62.1 kg)  04/12/17 144 lb (65.3 kg)  03/09/17 146 lb (66.2 kg)    Gen: NAD, alert, cooperative with exam, NCAT EYES: EOMI, no conjunctival injection, or no icterus ENT:  TMs pearly gray b/l, OP without erythema LYMPH: no cervical LAD CV: NRRR, normal S1/S2, no murmur, distal pulses 2+ b/l Resp: CTABL,  no wheezes, normal WOB Abd: +BS, soft, NTND. no guarding or organomegaly Ext: No edema, warm Neuro: Alert and oriented, strength equal b/l UE and LE, coordination grossly normal MSK: normal muscle bulk Psych: Normal affect, tearful at times  EKG: HR 114, p waves before qrs, appears to be sinus tach  Assessment & Plan:  Gabrielle Gallegos was seen today for hypertension and tachycardia.  Diagnoses and all orders for this visit:  Tachycardia Sinus tachycardia on EKG Lots of stress at home, smoking, does have some caffeine intake Discussed minimizing caffeine Will increase carvedilol which will also help with blood pressure control Check below labs Treat anxiety/panic attacks as below -     EKG 12-Lead -     CMP14+EGFR -     TSH -     CBC with Differential/Platelet -     carvedilol (COREG) 25 MG tablet; Take 1 tablet (25 mg total) by mouth 2 (two) times daily with a meal.  Panic attacks GAD (generalized anxiety disorder) Feels safe at home, mood is okay, no thoughts of self-harm Already on Cymbalta Discussed with patient, put in referral to virtual behavioral health for assistance with counseling Use hydroxyzine as needed -     hydrOXYzine (  ATARAX/VISTARIL) 10 MG tablet; Take 1 tablet (10 mg total) by mouth 3 (three) times daily as needed.  Essential hypertension Elevated today, increase in carvedilol as above   Follow up plan: Return in about 3 weeks (around 09/15/2017).  Sooner as needed, return precautions discussed Assunta Found, MD [place]

## 2017-08-25 NOTE — Telephone Encounter (Signed)
[facility]   Writer left a voice mail message.

## 2017-08-26 LAB — CMP14+EGFR
ALT: 16 IU/L (ref 0–32)
AST: 23 IU/L (ref 0–40)
Albumin/Globulin Ratio: 1.4 (ref 1.2–2.2)
Albumin: 4.5 g/dL (ref 3.6–4.8)
Alkaline Phosphatase: 110 IU/L (ref 39–117)
BUN/Creatinine Ratio: 25 (ref 12–28)
BUN: 13 mg/dL (ref 8–27)
Bilirubin Total: 0.2 mg/dL (ref 0.0–1.2)
CO2: 25 mmol/L (ref 20–29)
Calcium: 9.6 mg/dL (ref 8.7–10.3)
Chloride: 100 mmol/L (ref 96–106)
Creatinine, Ser: 0.53 mg/dL — ABNORMAL LOW (ref 0.57–1.00)
GFR calc Af Amer: 119 mL/min/{1.73_m2} (ref >59)
GFR calc non Af Amer: 104 mL/min/{1.73_m2} (ref >59)
Globulin, Total: 3.3 g/dL (ref 1.5–4.5)
Glucose: 91 mg/dL (ref 65–99)
Potassium: 4 mmol/L (ref 3.5–5.2)
Sodium: 144 mmol/L (ref 134–144)
Total Protein: 7.8 g/dL (ref 6.0–8.5)

## 2017-08-26 LAB — CBC WITH DIFFERENTIAL/PLATELET
Basophils Absolute: 0.1 10*3/uL (ref 0.0–0.2)
Basos: 1 %
EOS (ABSOLUTE): 0.2 10*3/uL (ref 0.0–0.4)
Eos: 2 %
Hematocrit: 49.6 % — ABNORMAL HIGH (ref 34.0–46.6)
Hemoglobin: 16.6 g/dL — ABNORMAL HIGH (ref 11.1–15.9)
Immature Grans (Abs): 0.1 10*3/uL (ref 0.0–0.1)
Immature Granulocytes: 1 %
Lymphocytes Absolute: 2.5 10*3/uL (ref 0.7–3.1)
Lymphs: 24 %
MCH: 28.8 pg (ref 26.6–33.0)
MCHC: 33.5 g/dL (ref 31.5–35.7)
MCV: 86 fL (ref 79–97)
Monocytes Absolute: 0.6 10*3/uL (ref 0.1–0.9)
Monocytes: 6 %
Neutrophils Absolute: 7 10*3/uL (ref 1.4–7.0)
Neutrophils: 66 %
Platelets: 208 10*3/uL (ref 150–379)
RBC: 5.76 x10E6/uL — ABNORMAL HIGH (ref 3.77–5.28)
RDW: 14.6 % (ref 12.3–15.4)
WBC: 10.4 10*3/uL (ref 3.4–10.8)

## 2017-08-26 LAB — TSH: TSH: 0.898 u[IU]/mL (ref 0.450–4.500)

## 2017-08-28 ENCOUNTER — Encounter: Payer: Self-pay | Admitting: Pediatrics

## 2017-08-30 ENCOUNTER — Telehealth: Payer: Self-pay

## 2017-09-05 ENCOUNTER — Telehealth: Payer: Self-pay

## 2017-09-05 NOTE — Telephone Encounter (Signed)
Writet left a Designer, television/film set message. I sent an staff in basket to Dr. Evette Doffing. After the third attempt the patient will be placed on the inactive list.

## 2017-09-05 NOTE — Telephone Encounter (Signed)
[facility]  Writer left a voice mail message.

## 2017-09-05 NOTE — Telephone Encounter (Signed)
[facility]  Writer left a voice mail message on 08/25/2017 and 112/24/2018.  Writer sent a in Sports coach to Dr. Evette Doffing

## 2017-09-15 ENCOUNTER — Telehealth: Payer: Self-pay

## 2017-09-15 NOTE — Telephone Encounter (Signed)
[facility]  Writer left a voice mail message in order to schedule an appt.

## 2017-09-23 ENCOUNTER — Other Ambulatory Visit: Payer: Self-pay | Admitting: *Deleted

## 2017-09-23 MED ORDER — TRAZODONE HCL 50 MG PO TABS: 25.0000 mg | ORAL_TABLET | Freq: Every evening | ORAL | 5 refills | Status: DC | PRN

## 2017-09-23 NOTE — Telephone Encounter (Signed)
Go ahead and send the medication refill for her with 5 refills

## 2017-09-23 NOTE — Telephone Encounter (Signed)
Fax RF request Trazadon 50 mg 1/2 - 1 tab qhs prn sleep #30 Not on current med list Please advise Gabrielle Gallegos

## 2017-10-22 ENCOUNTER — Other Ambulatory Visit: Payer: Self-pay | Admitting: Family Medicine

## 2017-11-05 ENCOUNTER — Other Ambulatory Visit: Payer: Self-pay | Admitting: Family Medicine

## 2017-11-08 ENCOUNTER — Other Ambulatory Visit: Payer: Self-pay | Admitting: Family Medicine

## 2017-11-08 ENCOUNTER — Telehealth: Payer: Self-pay | Admitting: Physician Assistant

## 2017-11-08 DIAGNOSIS — J42 Unspecified chronic bronchitis: Secondary | ICD-10-CM

## 2017-11-08 NOTE — Telephone Encounter (Signed)
PT TO CALL PHARMACY FOR REFILL

## 2017-11-12 ENCOUNTER — Other Ambulatory Visit: Payer: Self-pay | Admitting: Family Medicine

## 2017-11-19 ENCOUNTER — Other Ambulatory Visit: Payer: Self-pay | Admitting: Family Medicine

## 2017-12-10 ENCOUNTER — Other Ambulatory Visit: Payer: Self-pay | Admitting: Pediatrics

## 2017-12-10 ENCOUNTER — Other Ambulatory Visit: Payer: Self-pay | Admitting: Family Medicine

## 2017-12-10 DIAGNOSIS — K2100 Gastro-esophageal reflux disease with esophagitis, without bleeding: Secondary | ICD-10-CM

## 2017-12-10 DIAGNOSIS — F410 Panic disorder [episodic paroxysmal anxiety] without agoraphobia: Secondary | ICD-10-CM

## 2017-12-10 NOTE — Telephone Encounter (Signed)
Last seen 08/25/17 Dr Evette Doffing  Dr Dettinger PCP

## 2018-01-18 ENCOUNTER — Other Ambulatory Visit: Payer: Self-pay | Admitting: Physician Assistant

## 2018-01-18 ENCOUNTER — Other Ambulatory Visit: Payer: Self-pay | Admitting: Family Medicine

## 2018-01-18 ENCOUNTER — Other Ambulatory Visit: Payer: Self-pay | Admitting: Family

## 2018-01-18 DIAGNOSIS — G2581 Restless legs syndrome: Secondary | ICD-10-CM

## 2018-01-18 DIAGNOSIS — F410 Panic disorder [episodic paroxysmal anxiety] without agoraphobia: Secondary | ICD-10-CM

## 2018-01-18 NOTE — Telephone Encounter (Signed)
Last seen 08/25/17

## 2018-01-19 NOTE — Telephone Encounter (Signed)
Forward to PCP.

## 2018-02-08 ENCOUNTER — Ambulatory Visit (INDEPENDENT_AMBULATORY_CARE_PROVIDER_SITE_OTHER): Payer: Managed Care, Other (non HMO) | Admitting: Family Medicine

## 2018-02-08 ENCOUNTER — Encounter: Payer: Self-pay | Admitting: Family Medicine

## 2018-02-08 ENCOUNTER — Ambulatory Visit (INDEPENDENT_AMBULATORY_CARE_PROVIDER_SITE_OTHER): Payer: Managed Care, Other (non HMO)

## 2018-02-08 VITALS — BP 161/96 | HR 121 | Temp 98.3°F | Ht 64.0 in | Wt 134.0 lb

## 2018-02-08 DIAGNOSIS — R1011 Right upper quadrant pain: Secondary | ICD-10-CM

## 2018-02-08 DIAGNOSIS — G8929 Other chronic pain: Secondary | ICD-10-CM

## 2018-02-08 DIAGNOSIS — M545 Low back pain, unspecified: Secondary | ICD-10-CM

## 2018-02-08 DIAGNOSIS — R112 Nausea with vomiting, unspecified: Secondary | ICD-10-CM | POA: Diagnosis not present

## 2018-02-08 DIAGNOSIS — R1114 Bilious vomiting: Secondary | ICD-10-CM | POA: Diagnosis not present

## 2018-02-08 MED ORDER — ONDANSETRON 4 MG PO TBDP: 4.0000 mg | ORAL_TABLET | Freq: Three times a day (TID) | ORAL | 0 refills | Status: DC | PRN

## 2018-02-08 MED ORDER — TIZANIDINE HCL 4 MG PO TABS: 2.0000 mg | ORAL_TABLET | Freq: Three times a day (TID) | ORAL | 0 refills | Status: DC | PRN

## 2018-02-08 NOTE — Patient Instructions (Signed)
Nausea and Vomiting, Adult Feeling sick to your stomach (nausea) means that your stomach is upset or you feel like you have to throw up (vomit). Feeling more and more sick to your stomach can lead to throwing up. Throwing up happens when food and liquid from your stomach are thrown up and out the mouth. Throwing up can make you feel weak and cause you to get dehydrated. Dehydration can make you tired and thirsty, make you have a dry mouth, and make it so you pee (urinate) less often. Older adults and people with other diseases or a weak defense system (immune system) are at higher risk for dehydration. If you feel sick to your stomach or if you throw up, it is important to follow instructions from your doctor about how to take care of yourself. Follow these instructions at home: Eating and drinking Follow these instructions as told by your doctor:  Take an oral rehydration solution (ORS). This is a drink that is sold at pharmacies and stores.  Drink clear fluids in small amounts as you are able, such as: ? Water. ? Ice chips. ? Diluted fruit juice. ? Low-calorie sports drinks.  Eat bland, easy-to-digest foods in small amounts as you are able, such as: ? Bananas. ? Applesauce. ? Rice. ? Low-fat (lean) meats. ? Toast. ? Crackers.  Avoid fluids that have a lot of sugar or caffeine in them.  Avoid alcohol.  Avoid spicy or fatty foods.  General instructions  Drink enough fluid to keep your pee (urine) clear or pale yellow.  Wash your hands often. If you cannot use soap and water, use hand sanitizer.  Make sure that all people in your home wash their hands well and often.  Take over-the-counter and prescription medicines only as told by your doctor.  Rest at home while you get better.  Watch your condition for any changes.  Breathe slowly and deeply when you feel sick to your stomach.  Keep all follow-up visits as told by your doctor. This is important. Contact a doctor  if:  You have a fever.  You cannot keep fluids down.  Your symptoms get worse.  You have new symptoms.  You feel sick to your stomach for more than two days.  You feel light-headed or dizzy.  You have a headache.  You have muscle cramps. Get help right away if:  You have pain in your chest, neck, arm, or jaw.  You feel very weak or you pass out (faint).  You throw up again and again.  You see blood in your throw-up.  Your throw-up looks like black coffee grounds.  You have bloody or black poop (stools) or poop that look like tar.  You have a very bad headache, a stiff neck, or both.  You have a rash.  You have very bad pain, cramping, or bloating in your belly (abdomen).  You have trouble breathing.  You are breathing very quickly.  Your heart is beating very quickly.  Your skin feels cold and clammy.  You feel confused.  You have pain when you pee.  You have signs of dehydration, such as: ? Dark pee, hardly any pee, or no pee. ? Cracked lips. ? Dry mouth. ? Sunken eyes. ? Sleepiness. ? Weakness. These symptoms may be an emergency. Do not wait to see if the symptoms will go away. Get medical help right away. Call your local emergency services (911 in the [place] Do not drive yourself to the hospital. This information is  not intended to replace advice given to you by your health care provider. Make sure you discuss any questions you have with your health care provider. Document Released: 02/10/2008 Document Revised: 03/13/2016 Document Reviewed: 04/30/2015 Elsevier Interactive Patient Education  2018 [employer].

## 2018-02-08 NOTE — Progress Notes (Signed)
Subjective: CC: nausea/ vomiting PCP: Dettinger, Fransisca Kaufmann, MD LDJ:TTSVX Gabrielle Gallegos is a 62 y.o. female presenting to clinic today for:  1. Vomiting When patient reports acute onset of vomiting this morning.  She describes the vomitus as yellow in color.  She reports that she did not have anything to eat this morning and has not yet eaten today because of nausea.  She notes that she is had intermittent nausea for the last month.  She reports chronic right-sided abdominal pain, that was evaluated many years ago with a CT abdomen.  She reports this was unremarkable.  No increased abdominal pain.  She reports normal caliber, color and consistency bowel movements.  She typically has a bowel movement every other day.  Her last bowel movement was last evening.  No hematochezia or melena.  PMH significant for Acid reflux.  She is currently on omeprazole daily and reports compliance with his medicine.  She does report that she eats quite a bit of acidic food.  She has not seen her gastroenterologist since her colonoscopy.  She sees [facility] in [place].  2. Chronic low back pain Patient reports history Patient reports a history of chronic low back pain of chronic right-sided low back pain.  She notes that she had this evaluated previously by her PCP, who stated that she had a scoliotic curve in her back.  She notes difficulty lying on the right side secondary to discomfort.  She thinks she may have aggravated the back pain after lifting her disabled daughter.  Denies any lower extremity numbness, tingling, weakness or falls.  No radiation of pain down the legs.  No saddle anesthesia, fecal incontinence or urinary retention.She is used cyclobenzaprine in the past with little improvement in symptoms.  ROS: Per HPI  Allergies  Allergen Reactions  . Diltiazem Shortness Of Breath    , bloated  . Metoprolol Nausea And Vomiting   Past Medical History:  Diagnosis Date  . Acute exacerbation of chronic  obstructive pulmonary disease ([place])    05-27-2016 dx started on omnicef, oral prednisone  . Bladder tumor   . COPD (chronic obstructive pulmonary disease) ([place])   . Depression   . Fatigue   . GAD (generalized anxiety disorder)   . History of adenomatous polyp of colon    tubular adenoma's  10-30-2015  . History of bladder stone    01/ 2008  . History of chronic bronchitis    last bout 04-02-2016  . History of kidney stones   . History of panic attacks   . Hyperlipidemia   . Mixed stress and urge urinary incontinence   . Nephrolithiasis    bilateral nonobstructive per ct 05/ 2017  . Neuropathy   . Productive cough   . Renal cyst, left   . RLS (restless legs syndrome)   . Tachycardia, paroxysmal [facility])    last cardiologist-  dr Mare Ferrari  09/ 2015  felt to stress/ anxiety related  . Urgency of urination   . Wears glasses     Current Outpatient Medications:  .  albuterol (PROVENTIL HFA;VENTOLIN HFA) 108 (90 Base) MCG/ACT inhaler, INHALE 2 PUFFS BY MOUTH EVERY 6 HOURS AS NEEDED FOR WHEEZING AND FOR SHORTNESS OF BREATH, Disp: 9 each, Rfl: 0 .  carvedilol (COREG) 25 MG tablet, Take 1 tablet (25 mg total) by mouth 2 (two) times daily with a meal., Disp: 60 tablet, Rfl: 2 .  cyclobenzaprine (FLEXERIL) 10 MG tablet, TAKE 1 TABLET BY MOUTH THREE TIMES DAILY  AS NEEDED FOR MUSCLE SPASMS, Disp: 90 tablet, Rfl: 0 .  DULoxetine (CYMBALTA) 60 MG capsule, Take 1 capsule (60 mg total) by mouth daily., Disp: 90 capsule, Rfl: 1 .  gabapentin (NEURONTIN) 300 MG capsule, TAKE 1 CAPSULE BY MOUTH THREE TIMES DAILY, Disp: 90 capsule, Rfl: 0 .  hydrOXYzine (ATARAX/VISTARIL) 10 MG tablet, TAKE 1 TABLET BY MOUTH THREE TIMES DAILY AS NEEDED, Disp: 30 tablet, Rfl: 0 .  omeprazole (PRILOSEC) 20 MG capsule, TAKE 1 CAPSULE BY MOUTH ONCE DAILY, Disp: 90 capsule, Rfl: 0 .  pramipexole (MIRAPEX) 0.5 MG tablet, TAKE 1 TABLET BY MOUTH ONCE DAILY AT BEDTIME AS NEEDED MAY REPEAT 1 TIME IF NEEDED, Disp: 40 tablet, Rfl:  0 .  SPIRIVA HANDIHALER 18 MCG inhalation capsule, PLACE 1 CAPSULE INTO THE INHALER AND INHALE DAILY, Disp: 30 capsule, Rfl: 1 .  traZODone (DESYREL) 50 MG tablet, Take 0.5-1 tablets (25-50 mg total) by mouth at bedtime as needed for sleep., Disp: 30 tablet, Rfl: 5 Social History   Socioeconomic History  . Marital status: Married    Spouse name: Not on file  . Number of children: Not on file  . Years of education: Not on file  . Highest education level: Not on file  Occupational History  . Not on file  Social Needs  . Financial resource strain: Not on file  . Food insecurity:    Worry: Not on file    Inability: Not on file  . Transportation needs:    Medical: Not on file    Non-medical: Not on file  Tobacco Use  . Smoking status: Current Every Day Smoker    Packs/day: 0.50    Years: 30.00    Pack years: 15.00    Types: Cigarettes  . Smokeless tobacco: Never Used  Substance and Sexual Activity  . Alcohol use: No    Alcohol/week: 0.0 oz  . Drug use: No  . Sexual activity: Not on file  Lifestyle  . Physical activity:    Days per week: Not on file    Minutes per session: Not on file  . Stress: Not on file  Relationships  . Social connections:    Talks on phone: Not on file    Gets together: Not on file    Attends religious service: Not on file    Active member of club or organization: Not on file    Attends meetings of clubs or organizations: Not on file    Relationship status: Not on file  . Intimate partner violence:    Fear of current or ex partner: Not on file    Emotionally abused: Not on file    Physically abused: Not on file    Forced sexual activity: Not on file  Other Topics Concern  . Not on file  Social History Narrative  . Not on file   Family History  Problem Relation Age of Onset  . Heart disease Mother   . Cancer Father   . Lung cancer Sister   . Diabetes Brother     Objective: Office vital signs reviewed. BP (!) 161/96   Pulse (!) 121    Temp 98.3 F (36.8 C) (Oral)   Ht 5\' 4"  (1.626 m)   Wt 134 lb (60.8 kg)   BMI 23.00 kg/m    Physical Examination:  General: Awake, alert, well nourished, No acute distress HEENT: Normal, sclera white, MMM Cardio: increased rate Pulm:  normal work of breathing on room air GI: soft, mild TTP to  RUQ and RLQ. No peritoneal signs. No guarding. non-distended, bowel sounds present x4, no hepatomegaly, no splenomegaly, no masses Extremities: warm, well perfused, No edema, cyanosis or clubbing; +2 pulses bilaterally MSK: antalgic gait and hunched station  Lumbar spine; No midline tenderness to palpation.  Mild paraspinal muscle tenderness to palpation on the right.  No palpable bony abnormalities. Neuro: 5/5 LE Strength and light touch sensation grossly intact  Dg Abd 1 View  Result Date: 02/08/2018 CLINICAL DATA:  Abdominal pain and vomiting today. EXAM: ABDOMEN - 1 VIEW COMPARISON:  Single-view of the abdomen 03/31/2016. CT abdomen and pelvis 04/21/2016. FINDINGS: The bowel gas pattern is normal. No radio-opaque calculi or other significant radiographic abnormality are seen. Lumbar scoliosis and spondylosis and bilateral hip degenerative change noted. IMPRESSION: No acute abnormality. Electronically Signed   By: Inge Rise M.D.   On: 02/08/2018 14:32    Assessment/ Plan: 62 y.o. female   1. Non-intractable vomiting with nausea, unspecified vomiting  No evidence of acute abdomen on exam.  However given reports of nausea with vomiting and yellow vomitus, abdominal x-ray was obtained to evaluate for possible SBO.  Personal review of this did not demonstrate any abnormal gas patterns.  Radiologist confirms there are no acute abnormalities.  I prescribed her Zofran ODT to use as needed nausea and vomiting.  Push oral fluids.  I question whether or not this is related to uncontrolled acid reflux versus gastroenteritis. I referred her back to her gastroenterologist for further evaluation given her  chronic right-sided abdominal pain.  Strict return precautions and reasons for emergent evaluation the emergency department discussed.  Patient voiced good understanding. - DG Abd 1 View; Future - Ambulatory referral to Gastroenterology  2. Abdominal pain, chronic, right upper quadrant ? Gallbladder vs liver.  CT abdomen pelvis from 2014 reviewed.  It appears that she had a fatty liver on that exam with small bilateral nonobstructing renal stones.  CT renal stone study in 2017 also noted bilateral nephrolithiasis and fatty liver.  No acute intra-abdominal or intrapelvic abnormalities were noted. - Ambulatory referral to Gastroenterology  3. Chronic right-sided low back pain without sciatica We discussed consideration for orthopedics given persistent nature.  I have changed her Flexeril to Zanaflex to use as needed as directed.  Okay to continue Tylenol and oral NSAID if needed.  However, would be cautious with NSAID given current GI issues.   Orders Placed This Encounter  Procedures  . DG Abd 1 View    Standing Status:   Future    Standing Expiration Date:   04/10/2019    Order Specific Question:   Reason for Exam (SYMPTOM  OR DIAGNOSIS REQUIRED)    Answer:   bilious vomiting, not intractable    Order Specific Question:   Is the patient pregnant?    Answer:   No    Order Specific Question:   Preferred imaging location?    Answer:   Internal   Meds ordered this encounter  Medications  . ondansetron (ZOFRAN ODT) 4 MG disintegrating tablet    Sig: Take 1 tablet (4 mg total) by mouth every 8 (eight) hours as needed for nausea or vomiting.    Dispense:  20 tablet    Refill:  0  . tiZANidine (ZANAFLEX) 4 MG tablet    Sig: Take 0.5-1 tablets (2-4 mg total) by mouth every 8 (eight) hours as needed for muscle spasms.    Dispense:  30 tablet    Refill:  0  Janora Norlander, DO [facility] [phone]

## 2018-02-08 NOTE — Addendum Note (Signed)
Addended by: Janora Norlander on: 02/08/2018 03:56 PM   Modules accepted: Orders

## 2018-03-04 ENCOUNTER — Other Ambulatory Visit: Payer: Self-pay | Admitting: Family Medicine

## 2018-03-04 DIAGNOSIS — K2100 Gastro-esophageal reflux disease with esophagitis, without bleeding: Secondary | ICD-10-CM

## 2018-03-04 DIAGNOSIS — G2581 Restless legs syndrome: Secondary | ICD-10-CM

## 2018-03-18 ENCOUNTER — Other Ambulatory Visit: Payer: Self-pay | Admitting: Family Medicine

## 2018-03-22 ENCOUNTER — Encounter: Payer: Self-pay | Admitting: Family Medicine

## 2018-04-04 ENCOUNTER — Other Ambulatory Visit: Payer: Self-pay | Admitting: Family Medicine

## 2018-04-04 ENCOUNTER — Other Ambulatory Visit: Payer: Self-pay | Admitting: Pediatrics

## 2018-04-04 DIAGNOSIS — M549 Dorsalgia, unspecified: Secondary | ICD-10-CM

## 2018-04-04 DIAGNOSIS — F410 Panic disorder [episodic paroxysmal anxiety] without agoraphobia: Secondary | ICD-10-CM

## 2018-04-19 ENCOUNTER — Encounter: Payer: Managed Care, Other (non HMO) | Admitting: Family Medicine

## 2018-04-29 ENCOUNTER — Telehealth: Payer: Self-pay | Admitting: *Deleted

## 2018-05-02 ENCOUNTER — Other Ambulatory Visit: Payer: Self-pay | Admitting: Family Medicine

## 2018-05-02 MED ORDER — UMECLIDINIUM BROMIDE 62.5 MCG/INH IN AEPB: 1.0000 | INHALATION_SPRAY | Freq: Every day | RESPIRATORY_TRACT | 1 refills | Status: DC

## 2018-05-11 ENCOUNTER — Other Ambulatory Visit: Payer: Self-pay | Admitting: Family Medicine

## 2018-05-11 DIAGNOSIS — J42 Unspecified chronic bronchitis: Secondary | ICD-10-CM

## 2018-06-10 ENCOUNTER — Telehealth: Payer: Self-pay | Admitting: Family Medicine

## 2018-06-10 DIAGNOSIS — J42 Unspecified chronic bronchitis: Secondary | ICD-10-CM

## 2018-06-10 MED ORDER — VENTOLIN HFA 108 (90 BASE) MCG/ACT IN AERS: INHALATION_SPRAY | RESPIRATORY_TRACT | 3 refills | Status: DC

## 2018-06-10 NOTE — Telephone Encounter (Signed)
Pharmacy [facility]  Pt has lost her rescue inhaler wants to know if we can send another to pharmacy

## 2018-06-10 NOTE — Telephone Encounter (Signed)
Aware refill sent in

## 2018-06-16 ENCOUNTER — Other Ambulatory Visit: Payer: Self-pay | Admitting: Family Medicine

## 2018-06-16 NOTE — Telephone Encounter (Signed)
Last seen 02/08/18

## 2018-07-27 ENCOUNTER — Other Ambulatory Visit: Payer: Self-pay | Admitting: Family Medicine

## 2018-07-28 NOTE — Telephone Encounter (Signed)
Last seen 02/08/18

## 2018-07-29 NOTE — Telephone Encounter (Signed)
Patient aware and will call back to make an appt

## 2018-07-29 NOTE — Telephone Encounter (Signed)
Refilled.  Needs to be seen for f/u on BP.

## 2018-08-22 ENCOUNTER — Other Ambulatory Visit: Payer: Self-pay | Admitting: Family Medicine

## 2018-08-22 NOTE — Telephone Encounter (Signed)
Last seen 6./4/19

## 2018-09-20 ENCOUNTER — Other Ambulatory Visit: Payer: Self-pay | Admitting: Family Medicine

## 2018-09-20 DIAGNOSIS — K2100 Gastro-esophageal reflux disease with esophagitis, without bleeding: Secondary | ICD-10-CM

## 2018-09-30 ENCOUNTER — Telehealth: Payer: Self-pay | Admitting: Family Medicine

## 2018-09-30 NOTE — Telephone Encounter (Signed)
Pt advised we haven't rx'd her any Tramadol since 2017 and it was for acute problem. Advised of office policy/protocol and ntbs for any pain medication. Pt voiced understanding and will discuss at appt with Dr Darnell Level on 1/28.

## 2018-09-30 NOTE — Telephone Encounter (Signed)
Pharmacy: [facility]    Pt is needing a refill on Tramadol, states that she is not able to come in until next Tuesday (she has an apt on 1/28 with [facility]) due to her spouse being ran over by a tractor and she has to be there to take care of him, but on Tuesday therapy will be there long enough for her to come to her apt. She states that Dr Warrick Parisian is the one that fills her tramadol.

## 2018-10-04 ENCOUNTER — Encounter: Payer: Self-pay | Admitting: Family Medicine

## 2018-10-04 ENCOUNTER — Ambulatory Visit (INDEPENDENT_AMBULATORY_CARE_PROVIDER_SITE_OTHER): Payer: Managed Care, Other (non HMO) | Admitting: Family Medicine

## 2018-10-04 VITALS — BP 150/100 | HR 117 | Temp 97.4°F | Ht 64.0 in | Wt 132.0 lb

## 2018-10-04 DIAGNOSIS — R0602 Shortness of breath: Secondary | ICD-10-CM

## 2018-10-04 DIAGNOSIS — I479 Paroxysmal tachycardia, unspecified: Secondary | ICD-10-CM

## 2018-10-04 DIAGNOSIS — F411 Generalized anxiety disorder: Secondary | ICD-10-CM

## 2018-10-04 DIAGNOSIS — Z114 Encounter for screening for human immunodeficiency virus [HIV]: Secondary | ICD-10-CM

## 2018-10-04 DIAGNOSIS — F324 Major depressive disorder, single episode, in partial remission: Secondary | ICD-10-CM

## 2018-10-04 DIAGNOSIS — Z1159 Encounter for screening for other viral diseases: Secondary | ICD-10-CM

## 2018-10-04 DIAGNOSIS — K2100 Gastro-esophageal reflux disease with esophagitis, without bleeding: Secondary | ICD-10-CM

## 2018-10-04 DIAGNOSIS — F17200 Nicotine dependence, unspecified, uncomplicated: Secondary | ICD-10-CM

## 2018-10-04 DIAGNOSIS — I10 Essential (primary) hypertension: Secondary | ICD-10-CM

## 2018-10-04 DIAGNOSIS — E559 Vitamin D deficiency, unspecified: Secondary | ICD-10-CM

## 2018-10-04 MED ORDER — OMEPRAZOLE 20 MG PO CPDR: 20.0000 mg | DELAYED_RELEASE_CAPSULE | Freq: Every day | ORAL | 3 refills | Status: DC

## 2018-10-04 MED ORDER — TRAZODONE HCL 50 MG PO TABS: ORAL_TABLET | ORAL | 1 refills | Status: DC

## 2018-10-04 MED ORDER — CARVEDILOL 25 MG PO TABS: 25.0000 mg | ORAL_TABLET | Freq: Two times a day (BID) | ORAL | 2 refills | Status: DC

## 2018-10-04 MED ORDER — SERTRALINE HCL 50 MG PO TABS: 50.0000 mg | ORAL_TABLET | Freq: Every day | ORAL | 1 refills | Status: DC

## 2018-10-04 MED ORDER — GABAPENTIN 300 MG PO CAPS: 300.0000 mg | ORAL_CAPSULE | Freq: Three times a day (TID) | ORAL | 2 refills | Status: DC

## 2018-10-04 MED ORDER — LORAZEPAM 0.5 MG PO TABS: 0.2500 mg | ORAL_TABLET | Freq: Two times a day (BID) | ORAL | 0 refills | Status: DC | PRN

## 2018-10-04 NOTE — Progress Notes (Signed)
Subjective: CC: Anxiety, hypertension,?  COPD, chronic back pain PCP: Janora Norlander, DO Gabrielle Gallegos is a 63 y.o. female presenting to clinic today for:  1.  Anxiety Patient reports increased anxiety and stress secondary to multiple issues at home.  She notes the recent anniversary of her 2 brothers death, increased stress surrounding caring for her handicapped child who is totally dependent upon her, and her husband's recent accident at work where he fractured many bones in his pelvis and hips after being run over by a lift.  She was previously treated with Cymbalta but did not seem to find much improvement in her symptoms.  She has not been on any antianxiety medication for some time now.  Additionally, she notes that she was on Zoloft at one point and did find some improvement with this.  She also has been treated with Xanax in the past.  She has not seen a counselor but would be interested in having one call her.  Her time constraints at this time would not allow her to go to an appointment.  No SI, HI.  She does report feeling tremulous during anxiety attacks.  She has heart palpitations at baseline and has tachycardia.  She has had cardiac work-up for this in the past.  2.  Hypertension/tachycardia Patient with known hypertension and tachycardia.  She has had cardiac work-up for tachycardia in the past.  Baseline pulse is around 105 but has gone as high as 130s.  She reports compliance with carvedilol 25 mg p.o. twice daily.  She is on no other antihypertensives.  Denies any chest pain or shortness of breath out of baseline.  No lower extremity edema, dizziness or loss of consciousness.  3. ?  COPD In formal diagnosis.  She notes that she is never had formal pulmonary function tests.  She was initially treated with Spiriva which she felt was somewhat helpful then transitioned over to [facility] which she has not found to be especially helpful.  She also has albuterol on board  if needed.  Denies any wheeze.  She is still a smoker, citing that she recently started smoking 2 cigarettes/day after stress increased.  She was previously a 1 pack/day smoker for 25 years but stopped smoking this much about 1 year ago.  No hemoptysis.  4.  Chronic low back pain Patient reports she is under the care of orthopedics and there are plans for MRI at some point.  She currently is being treated with gabapentin for low back as well as Flexeril.  She also reports restless leg symptoms which she uses Mirapex for.  ROS: Per HPI  Allergies  Allergen Reactions  . Diltiazem Shortness Of Breath    , bloated  . Metoprolol Nausea And Vomiting   Past Medical History:  Diagnosis Date  . Acute exacerbation of chronic obstructive pulmonary disease ([place])    05-27-2016 dx started on omnicef, oral prednisone  . Bladder tumor   . COPD (chronic obstructive pulmonary disease) ([place])   . Depression   . Fatigue   . GAD (generalized anxiety disorder)   . History of adenomatous polyp of colon    tubular adenoma's  10-30-2015  . History of bladder stone    01/ 2008  . History of chronic bronchitis    last bout 04-02-2016  . History of kidney stones   . History of panic attacks   . Hyperlipidemia   . Mixed stress and urge urinary incontinence   . Nephrolithiasis  bilateral nonobstructive per ct 05/ 2017  . Neuropathy   . Productive cough   . Renal cyst, left   . RLS (restless legs syndrome)   . Tachycardia, paroxysmal [facility])    last cardiologist-  dr Mare Ferrari  09/ 2015  felt to stress/ anxiety related  . Urgency of urination   . Wears glasses     Current Outpatient Medications:  .  carvedilol (COREG) 25 MG tablet, Take 1 tablet (25 mg total) by mouth 2 (two) times daily with a meal., Disp: 60 tablet, Rfl: 2 .  cyclobenzaprine (FLEXERIL) 10 MG tablet, TAKE 1 TABLET BY MOUTH THREE TIMES DAILY AS NEEDED FOR MUSCLE SPASMS, Disp: 90 tablet, Rfl: 5 .  DULoxetine (CYMBALTA) 60 MG capsule,  Take 1 capsule (60 mg total) by mouth daily., Disp: 90 capsule, Rfl: 1 .  gabapentin (NEURONTIN) 300 MG capsule, TAKE 1 CAPSULE BY MOUTH THREE TIMES DAILY, Disp: 90 capsule, Rfl: 2 .  INCRUSE ELLIPTA 62.5 MCG/INH AEPB, INHALE 1 PUFF BY MOUTH ONCE DAILY, Disp: 90 each, Rfl: 2 .  omeprazole (PRILOSEC) 20 MG capsule, Take 1 capsule (20 mg total) by mouth daily. (Needs to be seen before next refill), Disp: 30 capsule, Rfl: 0 .  pramipexole (MIRAPEX) 0.5 MG tablet, TAKE 1 TABLET BY MOUTH ONCE DAILY AT BEDTIME AS NEEDED MAY  REPEAT  ONE  TIME  IF  NEEDED, Disp: 40 tablet, Rfl: 5 .  tiZANidine (ZANAFLEX) 4 MG tablet, TAKE 1/2 TO 1 (ONE-HALF TO ONE) TABLET BY MOUTH EVERY 8 HOURS AS NEEDED FOR MUSCLE SPASM, Disp: 30 tablet, Rfl: 2 .  traZODone (DESYREL) 50 MG tablet, TAKE 1/2 TO 1 (ONE-HALF TO ONE) TABLET BY MOUTH AT BEDTIME AS NEEDED FOR SLEEP, Disp: 30 tablet, Rfl: 5 .  VENTOLIN HFA 108 (90 Base) MCG/ACT inhaler, INHALE 2 PUFFS BY MOUTH EVERY 6 HOURS AS NEEDED FOR WHEEZING OR  SHORTNESS  OF  BREATH, Disp: 18 each, Rfl: 3 Social History   Socioeconomic History  . Marital status: Married    Spouse name: Not on file  . Number of children: Not on file  . Years of education: Not on file  . Highest education level: Not on file  Occupational History  . Not on file  Social Needs  . Financial resource strain: Not on file  . Food insecurity:    Worry: Not on file    Inability: Not on file  . Transportation needs:    Medical: Not on file    Non-medical: Not on file  Tobacco Use  . Smoking status: Current Every Day Smoker    Packs/day: 0.50    Years: 30.00    Pack years: 15.00    Types: Cigarettes  . Smokeless tobacco: Never Used  Substance and Sexual Activity  . Alcohol use: No    Alcohol/week: 0.0 standard drinks  . Drug use: No  . Sexual activity: Not on file  Lifestyle  . Physical activity:    Days per week: Not on file    Minutes per session: Not on file  . Stress: Not on file    Relationships  . Social connections:    Talks on phone: Not on file    Gets together: Not on file    Attends religious service: Not on file    Active member of club or organization: Not on file    Attends meetings of clubs or organizations: Not on file    Relationship status: Not on file  . Intimate partner violence:  Fear of current or ex partner: Not on file    Emotionally abused: Not on file    Physically abused: Not on file    Forced sexual activity: Not on file  Other Topics Concern  . Not on file  Social History Narrative  . Not on file   Family History  Problem Relation Age of Onset  . Heart disease Mother   . Cancer Father   . Lung cancer Sister   . Diabetes Brother     Objective: Office vital signs reviewed. BP (!) 150/100 Comment: manual  Pulse (!) 117   Temp (!) 97.4 F (36.3 C) (Oral)   Ht 5' 4" (1.626 m)   Wt 132 lb (59.9 kg)   BMI 22.66 kg/m   Physical Examination:  General: Awake, alert, well nourished, No acute distress HEENT: Normal    Neck: No masses palpated. No lymphadenopathy; no thyromegaly or goiter    Eyes: PERRLA, extraocular membranes intact, sclera white.  No exophthalmos    Nose: nasal turbinates moist    Throat: moist mucus membranes Cardio: Tachycardic with regular rhythm, S1S2 heard, no murmurs appreciated Pulm: clear to auscultation bilaterally, no wheezes, rhonchi or rales; normal work of breathing on room air Extremities: warm, well perfused, No edema, cyanosis or clubbing; +2 pulses bilaterally MSK: Ambulates independently.  Normal gait and station Neuro: mild tremor noted.  Depression screen [facility] 2/9 10/04/2018 02/08/2018 08/25/2017  Decreased Interest 0 0 0  Down, Depressed, Hopeless 0 0 0  PHQ - 2 Score 0 0 0  Altered sleeping 0 - -  Tired, decreased energy 1 - -  Change in appetite 1 - -  Feeling bad or failure about yourself  0 - -  Trouble concentrating 0 - -  Moving slowly or fidgety/restless 0 - -  Suicidal  thoughts 0 - -  PHQ-9 Score 2 - -   GAD 7 : Generalized Anxiety Score 10/04/2018  Nervous, Anxious, on Edge 3  Control/stop worrying 3  Worry too much - different things 3  Trouble relaxing 2  Restless 1  Easily annoyed or irritable 3  Afraid - awful might happen 3  Total GAD 7 Score 18  Anxiety Difficulty Very difficult    Assessment/ Plan: 63 y.o. female   1. Essential hypertension Not controlled.  I reviewed her blood pressure check in June, which was also not at goal.  Patient had been ill during that visit.  I have asked that she monitor her blood pressures closely at home, record these and call the office with readings in 1 week.  If persistently above 140/90, we will plan to start Norvasc 5 mg daily and recheck.  She voiced good understanding of plan.  Continue Coreg 25 mg p.o. twice daily. - CMP14+EGFR  2. Tachycardia, paroxysmal (HCC) Heart rate noted to be elevated during today's visit but appears to be around her baseline.  Last office visit was 121.  Continue Coreg.  Check TSH, CBC and CMP. - CMP14+EGFR - TSH - CBC - carvedilol (COREG) 25 MG tablet; Take 1 tablet (25 mg total) by mouth 2 (two) times daily with a meal.  Dispense: 180 tablet; Refill: 2  3. GAD (generalized anxiety disorder) Seems to be an exacerbation at this time.  I suspect this is reactive to multiple stressors at home.  She has done well on Zoloft previously so we have reinitiated this at 50 mg daily.  Trazodone also refilled.  Small quantity of Ativan also provided.  We discussed  sparing use of this medicine and to avoid any alcohol use with it.  She was good understanding and understand that this will be a short-term prescription while she transitions back onto the SSRI.  She will follow-up in the next 6 weeks for recheck of this issue  4. Major depressive disorder with single episode, in partial remission ([place]) As above  5. Tobacco use disorder Cessation recommended.  She is smoking 2  cigarettes/day.  6. Gastroesophageal reflux disease with esophagitis Refilled PPI.  Stable. - omeprazole (PRILOSEC) 20 MG capsule; Take 1 capsule (20 mg total) by mouth daily.  Dispense: 90 capsule; Refill: 3  7. Vitamin D deficiency - VITAMIN D 25 Hydroxy (Vit-D Deficiency, Fractures)  8. Encounter for hepatitis C screening test for low risk patient - Hepatitis C antibody  9. Screening for HIV without presence of risk factors - HIV antibody (with reflex)  10. Shortness of breath ?COPD. No previous PFTs.  Patient w/ long standing smoking hx.  Patient requests formal evaluation.  Referral placed. - Ambulatory referral to Pulmonology   Orders Placed This Encounter  Procedures  . CMP14+EGFR  . TSH  . VITAMIN D 25 Hydroxy (Vit-D Deficiency, Fractures)  . CBC  . Hepatitis C antibody  . HIV antibody (with reflex)   Meds ordered this encounter  Medications  . sertraline (ZOLOFT) 50 MG tablet    Sig: Take 1 tablet (50 mg total) by mouth daily.    Dispense:  30 tablet    Refill:  1  . LORazepam (ATIVAN) 0.5 MG tablet    Sig: Take 0.5-1 tablets (0.25-0.5 mg total) by mouth 2 (two) times daily as needed for anxiety.    Dispense:  20 tablet    Refill:  0  . carvedilol (COREG) 25 MG tablet    Sig: Take 1 tablet (25 mg total) by mouth 2 (two) times daily with a meal.    Dispense:  180 tablet    Refill:  2  . gabapentin (NEURONTIN) 300 MG capsule    Sig: Take 1 capsule (300 mg total) by mouth 3 (three) times daily.    Dispense:  180 capsule    Refill:  2    Please consider 90 day supplies to promote better adherence  . omeprazole (PRILOSEC) 20 MG capsule    Sig: Take 1 capsule (20 mg total) by mouth daily.    Dispense:  90 capsule    Refill:  3  . traZODone (DESYREL) 50 MG tablet    Sig: TAKE 1/2 TO 1 (ONE-HALF TO ONE) TABLET BY MOUTH AT BEDTIME AS NEEDED FOR SLEEP    Dispense:  90 tablet    Refill:  1    Please consider 90 day supplies to promote better adherence   The  Narcotic Database has been reviewed.  Tramadol being prescribed by Dr Alfonso Ramus with orthopedics.  She is currently undergoing evaluation for disc in her back.  There were no red flags.     Janora Norlander, DO [place] [phone]

## 2018-10-04 NOTE — Patient Instructions (Addendum)
You had labs performed today.  You will be contacted with the results of the labs once they are available, usually in the next 3 business days for routine lab work.   Your blood pressure is not at goal today.  Please monitor your blood pressure daily and contact me with your blood pressure readings in 1 week.  If blood pressure continues to be elevated, we will plan to add an additional medication and have you follow-up with the nurse for blood pressure recheck.  See me in 6 to 8 weeks for recheck of your mood.  Schedule your mammogram and pap smear appointments.  Taking the medicine as directed and not missing any doses is one of the best things you can do to treat your depression/ anxiety.  Here are some things to keep in mind:  1) Side effects (stomach upset, some increased anxiety) may happen before you notice a benefit.  These side effects typically go away over time. 2) Changes to your dose of medicine or a change in medication all together is sometimes necessary 3) Most people need to be on medication at least 12 months 4) Many people will notice an improvement within two weeks but the full effect of the medication can take up to 4-6 weeks 5) Stopping the medication when you start feeling better often results in a return of symptoms 6) Never discontinue your medication without contacting a health care professional first.  Some medications require gradual discontinuation/ taper and can make you sick if you stop them abruptly.  If your symptoms worsen or you have thoughts of suicide/homicide, PLEASE SEEK IMMEDIATE MEDICAL ATTENTION.  You may always call:  National Suicide Hotline: 904-458-4974 [place]: 951-360-1631 Crisis Recovery in [place]: 805 656 5156   These are available 24 hours a day, 7 days a week.

## 2018-10-05 ENCOUNTER — Other Ambulatory Visit: Payer: Self-pay | Admitting: Family Medicine

## 2018-10-05 LAB — HEPATITIS C ANTIBODY: Hep C Virus Ab: <0.1 s/co ratio (ref 0.0–0.9)

## 2018-10-05 LAB — CMP14+EGFR
ALT: 8 IU/L (ref 0–32)
AST: 11 IU/L (ref 0–40)
Albumin/Globulin Ratio: 1.3 (ref 1.2–2.2)
Albumin: 4.3 g/dL (ref 3.8–4.8)
Alkaline Phosphatase: 120 IU/L — ABNORMAL HIGH (ref 39–117)
BUN/Creatinine Ratio: 24 (ref 12–28)
BUN: 17 mg/dL (ref 8–27)
Bilirubin Total: 0.3 mg/dL (ref 0.0–1.2)
CO2: 24 mmol/L (ref 20–29)
Calcium: 9.6 mg/dL (ref 8.7–10.3)
Chloride: 97 mmol/L (ref 96–106)
Creatinine, Ser: 0.72 mg/dL (ref 0.57–1.00)
GFR calc Af Amer: 105 mL/min/{1.73_m2} (ref >59)
GFR calc non Af Amer: 91 mL/min/{1.73_m2} (ref >59)
Globulin, Total: 3.3 g/dL (ref 1.5–4.5)
Glucose: 93 mg/dL (ref 65–99)
Potassium: 4.6 mmol/L (ref 3.5–5.2)
Sodium: 139 mmol/L (ref 134–144)
Total Protein: 7.6 g/dL (ref 6.0–8.5)

## 2018-10-05 LAB — HIV ANTIBODY (ROUTINE TESTING W REFLEX): HIV Screen 4th Generation wRfx: NONREACTIVE

## 2018-10-05 LAB — CBC
Hematocrit: 46 % (ref 34.0–46.6)
Hemoglobin: 15 g/dL (ref 11.1–15.9)
MCH: 27 pg (ref 26.6–33.0)
MCHC: 32.6 g/dL (ref 31.5–35.7)
MCV: 83 fL (ref 79–97)
Platelets: 284 10*3/uL (ref 150–450)
RBC: 5.56 x10E6/uL — ABNORMAL HIGH (ref 3.77–5.28)
RDW: 14.1 % (ref 11.7–15.4)
WBC: 10.4 10*3/uL (ref 3.4–10.8)

## 2018-10-05 LAB — TSH: TSH: 0.698 u[IU]/mL (ref 0.450–4.500)

## 2018-10-05 LAB — VITAMIN D 25 HYDROXY (VIT D DEFICIENCY, FRACTURES): Vit D, 25-Hydroxy: 10 ng/mL — ABNORMAL LOW (ref 30.0–100.0)

## 2018-10-05 MED ORDER — CHOLECALCIFEROL 1.25 MG (50000 UT) PO CAPS: 50000.0000 [IU] | ORAL_CAPSULE | ORAL | 0 refills | Status: AC

## 2018-10-12 ENCOUNTER — Other Ambulatory Visit: Payer: Self-pay | Admitting: Family Medicine

## 2018-10-12 ENCOUNTER — Telehealth: Payer: Self-pay | Admitting: Family Medicine

## 2018-10-12 DIAGNOSIS — I479 Paroxysmal tachycardia, unspecified: Secondary | ICD-10-CM

## 2018-10-12 DIAGNOSIS — I10 Essential (primary) hypertension: Secondary | ICD-10-CM

## 2018-10-12 DIAGNOSIS — R0600 Dyspnea, unspecified: Secondary | ICD-10-CM

## 2018-10-12 DIAGNOSIS — R0609 Other forms of dyspnea: Secondary | ICD-10-CM

## 2018-10-12 MED ORDER — LOSARTAN POTASSIUM 25 MG PO TABS: 25.0000 mg | ORAL_TABLET | Freq: Every day | ORAL | 1 refills | Status: DC

## 2018-10-12 NOTE — Telephone Encounter (Signed)
Losartan 25 sent instead since she had adverse reaction to Diltiazem in past.  This has been discussed on phone with her.

## 2018-10-12 NOTE — Telephone Encounter (Signed)
Patient aware.

## 2018-10-12 NOTE — Telephone Encounter (Signed)
Would you like me to send in a prescription for the Norvasc 5 mg daily and her to continue Carvedilol twice daily?

## 2018-10-12 NOTE — Telephone Encounter (Signed)
She is still having quite a few highs.  We will increase blood pressure regimen as we discussed during visit.  I'd like her to see me in the next 3 weeks for recheck.  She is to continuing monitoring BPs daily.

## 2018-10-12 NOTE — Progress Notes (Signed)
I have added losartan 25 mg daily to her blood pressure regimen for persistently elevated blood pressures.  Initially, Norvasc 5 mg daily was considered but given her adverse reaction to diltiazem in the past, I worry that she will have recurrence of shortness of breath with this.  She knows that she should continue monitoring blood pressures daily with goal of less than 140/90.  She will come in for a kidney function check in 1 week.  Additionally, she notes that she has been having some dyspnea on exertion, particularly with ambulating up and down stairs and other moderately exerted activities.  She is not seen cardiology in some time but would like a recheck.  Denies any lower extremity edema orthopnea.  Of note, she does have COPD that has been diagnosed without formal pulmonary function testing.  She was referred to pulmonology at last visit but has yet to receive an appointment.  We will check in on this.  Orders Placed This Encounter  Procedures  . Basic Metabolic Panel    Standing Status:   Future    Standing Expiration Date:   10/13/2019  . Ambulatory referral to Cardiology    Referral Priority:   Routine    Referral Type:   Consultation    Referral Reason:   Specialty Services Required    Requested Specialty:   Cardiology    Number of Visits Requested:   1   Meds ordered this encounter  Medications  . losartan (COZAAR) 25 MG tablet    Sig: Take 1 tablet (25 mg total) by mouth daily.    Dispense:  30 tablet    Refill:  1

## 2018-10-21 ENCOUNTER — Other Ambulatory Visit: Payer: Self-pay | Admitting: Family Medicine

## 2018-10-29 ENCOUNTER — Other Ambulatory Visit: Payer: Managed Care, Other (non HMO)

## 2018-11-05 ENCOUNTER — Encounter: Payer: Self-pay | Admitting: Gastroenterology

## 2018-11-07 ENCOUNTER — Other Ambulatory Visit: Payer: Managed Care, Other (non HMO)

## 2018-11-07 DIAGNOSIS — I10 Essential (primary) hypertension: Secondary | ICD-10-CM

## 2018-11-08 ENCOUNTER — Telehealth: Payer: Self-pay | Admitting: Family Medicine

## 2018-11-08 ENCOUNTER — Ambulatory Visit (INDEPENDENT_AMBULATORY_CARE_PROVIDER_SITE_OTHER): Payer: Managed Care, Other (non HMO) | Admitting: Cardiovascular Disease

## 2018-11-08 ENCOUNTER — Encounter: Payer: Self-pay | Admitting: Cardiovascular Disease

## 2018-11-08 VITALS — BP 142/88 | HR 113 | Ht 64.0 in | Wt 134.8 lb

## 2018-11-08 DIAGNOSIS — R000 Tachycardia, unspecified: Secondary | ICD-10-CM | POA: Diagnosis not present

## 2018-11-08 DIAGNOSIS — R0600 Dyspnea, unspecified: Secondary | ICD-10-CM

## 2018-11-08 DIAGNOSIS — I10 Essential (primary) hypertension: Secondary | ICD-10-CM | POA: Diagnosis not present

## 2018-11-08 DIAGNOSIS — R0609 Other forms of dyspnea: Secondary | ICD-10-CM

## 2018-11-08 LAB — BASIC METABOLIC PANEL
BUN/Creatinine Ratio: 22 (ref 12–28)
BUN: 20 mg/dL (ref 8–27)
CO2: 28 mmol/L (ref 20–29)
Calcium: 9.9 mg/dL (ref 8.7–10.3)
Chloride: 98 mmol/L (ref 96–106)
Creatinine, Ser: 0.91 mg/dL (ref 0.57–1.00)
GFR calc Af Amer: 79 mL/min/{1.73_m2} (ref >59)
GFR calc non Af Amer: 68 mL/min/{1.73_m2} (ref >59)
Glucose: 100 mg/dL — ABNORMAL HIGH (ref 65–99)
Potassium: 4.6 mmol/L (ref 3.5–5.2)
Sodium: 141 mmol/L (ref 134–144)

## 2018-11-08 MED ORDER — CARVEDILOL 25 MG PO TABS: 37.5000 mg | ORAL_TABLET | Freq: Two times a day (BID) | ORAL | 2 refills | Status: DC

## 2018-11-08 NOTE — Progress Notes (Signed)
CARDIOLOGY CONSULT NOTE  Patient ID: Gabrielle Gallegos MRN: [id number] DOB/AGE: 62-Mar-1957 62 y.o.  Admit date: (Not on file) Primary Physician: Janora Norlander, DO Referring Physician: Janora Norlander, DO  Reason for Consultation: Exertional dyspnea and tachycardia  HPI: Gabrielle Gallegos is a 63 y.o. female who is being seen today for the evaluation of exertional dyspnea and tachycardia at the request of Ronnie Doss M, DO.   Past medical history includes inappropriate sinus tachycardia.  She was evaluated by Dr. Mare Ferrari most recently in September 2015.  He felt her tachycardia was likely related to anxiety and stress.  Previous Holter monitoring showed only sinus rhythm.  She did not tolerate diltiazem or metoprolol and was started on carvedilol at that time.  Echocardiogram on 05/23/2014 demonstrated normal left ventricular systolic function, LVEF 60 to 65%, normal regional wall motion, and mild LVH.  She has a lot of emotional stressors at home.  Her husband was run over by a tractor and has several fractures and she is helping to take care of him.  She has a 62 year old special needs daughter whom she also takes care of.  2 brothers passed away last year and one sister died in 12-Dec-2016.  She has a lot of stress related to this as well.  Labs on 10/04/2018 showed low vitamin D level of 10.  There was no anemia and TSH was normal.  I ordered and personally reviewed an ECG performed today which shows sinus tachycardia, 107 bpm.  She is down to smoking 1 cigarette daily and wants to quit.  She denies exertional chest pain.  She has restless leg syndrome.  She experiences exertional dyspnea when climbing up stairs or when picking up her daughter who weighs about 62 pounds.   Allergies  Allergen Reactions  . Diltiazem Shortness Of Breath    , bloated  . Metoprolol Nausea And Vomiting    Current Outpatient Medications  Medication Sig Dispense Refill  .  carvedilol (COREG) 25 MG tablet Take 1 tablet (25 mg total) by mouth 2 (two) times daily with a meal. 180 tablet 2  . Cholecalciferol 1.25 MG (50000 UT) capsule Take 1 capsule (50,000 Units total) by mouth every 7 (seven) days for 12 doses. 12 capsule 0  . gabapentin (NEURONTIN) 300 MG capsule Take 1 capsule (300 mg total) by mouth 3 (three) times daily. 180 capsule 2  . INCRUSE ELLIPTA 62.5 MCG/INH AEPB INHALE 1 PUFF BY MOUTH ONCE DAILY 90 each 2  . LORazepam (ATIVAN) 0.5 MG tablet Take 0.5-1 tablets (0.25-0.5 mg total) by mouth 2 (two) times daily as needed for anxiety. 20 tablet 0  . losartan (COZAAR) 25 MG tablet Take 1 tablet (25 mg total) by mouth daily. 30 tablet 1  . omeprazole (PRILOSEC) 20 MG capsule Take 1 capsule (20 mg total) by mouth daily. 90 capsule 3  . pramipexole (MIRAPEX) 0.5 MG tablet TAKE 1 TABLET BY MOUTH ONCE DAILY AT BEDTIME AS NEEDED MAY  REPEAT  ONE  TIME  IF  NEEDED 40 tablet 5  . sertraline (ZOLOFT) 50 MG tablet Take 1 tablet (50 mg total) by mouth daily. 30 tablet 1  . traZODone (DESYREL) 50 MG tablet TAKE 1/2 TO 1 (ONE-HALF TO ONE) TABLET BY MOUTH AT BEDTIME AS NEEDED FOR SLEEP 90 tablet 1  . VENTOLIN HFA 108 (90 Base) MCG/ACT inhaler INHALE 2 PUFFS BY MOUTH EVERY 6 HOURS AS NEEDED FOR WHEEZING OR  SHORTNESS  OF  BREATH 18 each 3   No current facility-administered medications for this visit.     Past Medical History:  Diagnosis Date  . Acute exacerbation of chronic obstructive pulmonary disease ([place])    05-27-2016 dx started on omnicef, oral prednisone  . Bladder tumor   . COPD (chronic obstructive pulmonary disease) ([place])   . Depression   . Fatigue   . GAD (generalized anxiety disorder)   . History of adenomatous polyp of colon    tubular adenoma's  10-30-2015  . History of bladder stone    01/ 2008  . History of chronic bronchitis    last bout 04-02-2016  . History of kidney stones   . History of panic attacks   . Hyperlipidemia   . Mixed stress and  urge urinary incontinence   . Nephrolithiasis    bilateral nonobstructive per ct 05/ 2017  . Neuropathy   . Productive cough   . Renal cyst, left   . RLS (restless legs syndrome)   . Tachycardia 05/31/2014  . Tachycardia, paroxysmal [facility])    last cardiologist-  dr Mare Ferrari  09/ 2015  felt to stress/ anxiety related  . Urgency of urination   . Wears glasses     Past Surgical History:  Procedure Laterality Date  . ABDOMINAL HYSTERECTOMY  1983  . CARPAL TUNNEL RELEASE Left 1992  . EXCISION GANGLION RIGHT LITTLE FINGER  03/23/2001  . EXTRACORPOREAL SHOCK WAVE LITHOTRIPSY  2015  approx  . KNEE ARTHROSCOPY Left 1994  . KNEE SURGERY Left 1976  . ORIF GREAT TOE  Left 2007  approx  . TRANSTHORACIC ECHOCARDIOGRAM  05/23/2014   mild LVH,  ef 60-65%  . TRANSURETHRAL RESECTION OF BLADDER TUMOR WITH MITOMYCIN-C N/A 06/01/2016   Procedure: TRANSURETHRAL RESECTION OF BLADDER TUMOR WITH MITOMYCIN-C IN PACU POST OP;  Surgeon: Kathie Rhodes, MD;  Location: [place];  Service: Urology;  Laterality: N/A;  . ULNAR NERVE REPAIR Left 1994    Social History   Socioeconomic History  . Marital status: Married    Spouse name: Not on file  . Number of children: Not on file  . Years of education: Not on file  . Highest education level: Not on file  Occupational History  . Not on file  Social Needs  . Financial resource strain: Not on file  . Food insecurity:    Worry: Not on file    Inability: Not on file  . Transportation needs:    Medical: Not on file    Non-medical: Not on file  Tobacco Use  . Smoking status: Current Every Day Smoker    Packs/day: 0.10    Years: 30.00    Pack years: 3.00    Types: Cigarettes  . Smokeless tobacco: Never Used  Substance and Sexual Activity  . Alcohol use: No    Alcohol/week: 0.0 standard drinks  . Drug use: No  . Sexual activity: Not on file  Lifestyle  . Physical activity:    Days per week: Not on file    Minutes per session: Not on  file  . Stress: Not on file  Relationships  . Social connections:    Talks on phone: Not on file    Gets together: Not on file    Attends religious service: Not on file    Active member of club or organization: Not on file    Attends meetings of clubs or organizations: Not on file    Relationship status: Not on file  .  Intimate partner violence:    Fear of current or ex partner: Not on file    Emotionally abused: Not on file    Physically abused: Not on file    Forced sexual activity: Not on file  Other Topics Concern  . Not on file  Social History Narrative  . Not on file     No family history of premature CAD in 1st degree relatives.  Current Meds  Medication Sig  . carvedilol (COREG) 25 MG tablet Take 1 tablet (25 mg total) by mouth 2 (two) times daily with a meal.  . Cholecalciferol 1.25 MG (50000 UT) capsule Take 1 capsule (50,000 Units total) by mouth every 7 (seven) days for 12 doses.  Marland Kitchen gabapentin (NEURONTIN) 300 MG capsule Take 1 capsule (300 mg total) by mouth 3 (three) times daily.  . INCRUSE ELLIPTA 62.5 MCG/INH AEPB INHALE 1 PUFF BY MOUTH ONCE DAILY  . LORazepam (ATIVAN) 0.5 MG tablet Take 0.5-1 tablets (0.25-0.5 mg total) by mouth 2 (two) times daily as needed for anxiety.  Marland Kitchen losartan (COZAAR) 25 MG tablet Take 1 tablet (25 mg total) by mouth daily.  Marland Kitchen omeprazole (PRILOSEC) 20 MG capsule Take 1 capsule (20 mg total) by mouth daily.  . pramipexole (MIRAPEX) 0.5 MG tablet TAKE 1 TABLET BY MOUTH ONCE DAILY AT BEDTIME AS NEEDED MAY  REPEAT  ONE  TIME  IF  NEEDED  . sertraline (ZOLOFT) 50 MG tablet Take 1 tablet (50 mg total) by mouth daily.  . traZODone (DESYREL) 50 MG tablet TAKE 1/2 TO 1 (ONE-HALF TO ONE) TABLET BY MOUTH AT BEDTIME AS NEEDED FOR SLEEP  . VENTOLIN HFA 108 (90 Base) MCG/ACT inhaler INHALE 2 PUFFS BY MOUTH EVERY 6 HOURS AS NEEDED FOR WHEEZING OR  SHORTNESS  OF  BREATH      Review of systems complete and found to be negative unless listed above in  HPI    Physical exam Blood pressure (!) 142/88, pulse (!) 113, height 5\' 4"  (1.626 m), weight 134 lb 12.8 oz (61.1 kg), SpO2 93 %. General: NAD Neck: No JVD, no thyromegaly or thyroid nodule.  Lungs: Diffusely diminished breath sounds, no crackles. CV: Nondisplaced PMI.  Tachycardic, regular rhythm, normal S1/S2, no S3/S4, no murmur.  No peripheral edema.  No carotid bruit.    Abdomen: Soft, nontender, no distention.  Skin: Intact without lesions or rashes.  Neurologic: Alert and oriented x 3.  Psych: Normal affect. Extremities: No clubbing or cyanosis.  HEENT: Normal.   ECG: Most recent ECG reviewed.   Labs: Lab Results  Component Value Date/Time   K 4.6 11/07/2018 09:14 AM   BUN 20 11/07/2018 09:14 AM   CREATININE 0.91 11/07/2018 09:14 AM   ALT 8 10/04/2018 11:47 AM   TSH 0.698 10/04/2018 11:47 AM   HGB 15.0 10/04/2018 11:47 AM     Lipids: Lab Results  Component Value Date/Time   LDLCALC Comment 08/29/2015 11:40 AM   LDLCALC Comment 05/31/2014 02:55 PM   CHOL 328 (H) 08/29/2015 11:40 AM   TRIG 1,039 (HH) 08/29/2015 11:40 AM   TRIG 1,315 ([place]) 05/31/2014 02:55 PM   HDL 42 08/29/2015 11:40 AM   HDL 41 05/31/2014 02:55 PM        ASSESSMENT AND PLAN:  1.  Inappropriate sinus tachycardia: Heart rate is elevated today.  This is likely multifactorial in etiology with a significant amount of anxiety and stress contributing.  I will increase carvedilol to 37.5 mg twice daily.  2.  Hypertension: Blood  pressure is elevated.  I will increase carvedilol to 37.5 mg twice daily.  3.  Exertional dyspnea: This is likely multifactorial in etiology.  She has a history of tobacco use and is trying to quit and breath sounds are diffusely diminished suggestive of COPD.  She is scheduled to see pulmonary on 11/17/2018 and will need PFTs for a formal diagnosis.  She also has an inappropriate sinus tachycardia.  She also has significant levels of anxiety and stress and when she calms her  self down shortness of breath improves.  I will try to control heart rate by increasing carvedilol to 37.5 mg twice daily.   Disposition: Follow up in 4 months  Signed: Kate Sable, M.D., F.A.C.C.  11/08/2018, 10:47 AM

## 2018-11-08 NOTE — Patient Instructions (Signed)
Medication Instructions: INCREASE Coreg to 37.5 mg twice a day  (take 1 1/2 tablets twice a day)  Labwork: None today  Procedures/Testing: None today  Follow-Up: 4 months with Dr.Koneswaran  Any Additional Special Instructions Will Be Listed Below (If Applicable).     If you need a refill on your cardiac medications before your next appointment, please call your pharmacy.

## 2018-11-09 ENCOUNTER — Institutional Professional Consult (permissible substitution): Payer: Managed Care, Other (non HMO) | Admitting: Pulmonary Disease

## 2018-11-10 ENCOUNTER — Other Ambulatory Visit: Payer: Self-pay | Admitting: Family Medicine

## 2018-11-10 DIAGNOSIS — J42 Unspecified chronic bronchitis: Secondary | ICD-10-CM

## 2018-11-10 DIAGNOSIS — G2581 Restless legs syndrome: Secondary | ICD-10-CM

## 2018-11-10 NOTE — Telephone Encounter (Signed)
Last seen 11/08/2018

## 2018-11-10 NOTE — Telephone Encounter (Signed)
[id number]-cb 3/5

## 2018-11-10 NOTE — Telephone Encounter (Signed)
Ativan was given so she could restart antidepressant medicine.  This was not intended for long term use.  If she is needing this, she will need to be seen. Other rx's sent

## 2018-11-15 ENCOUNTER — Ambulatory Visit (INDEPENDENT_AMBULATORY_CARE_PROVIDER_SITE_OTHER): Payer: Managed Care, Other (non HMO) | Admitting: Family Medicine

## 2018-11-15 VITALS — BP 138/80 | HR 104 | Temp 97.8°F | Ht 64.0 in | Wt 135.0 lb

## 2018-11-15 DIAGNOSIS — I479 Paroxysmal tachycardia, unspecified: Secondary | ICD-10-CM | POA: Diagnosis not present

## 2018-11-15 DIAGNOSIS — F324 Major depressive disorder, single episode, in partial remission: Secondary | ICD-10-CM | POA: Diagnosis not present

## 2018-11-15 DIAGNOSIS — F411 Generalized anxiety disorder: Secondary | ICD-10-CM | POA: Diagnosis not present

## 2018-11-15 DIAGNOSIS — I10 Essential (primary) hypertension: Secondary | ICD-10-CM | POA: Diagnosis not present

## 2018-11-15 MED ORDER — SERTRALINE HCL 100 MG PO TABS: 100.0000 mg | ORAL_TABLET | Freq: Every day | ORAL | 0 refills | Status: DC

## 2018-11-15 MED ORDER — TIOTROPIUM BROMIDE MONOHYDRATE 2.5 MCG/ACT IN AERS: 1.0000 | INHALATION_SPRAY | Freq: Every day | RESPIRATORY_TRACT | 0 refills | Status: DC

## 2018-11-15 MED ORDER — LORAZEPAM 0.5 MG PO TABS: 0.2500 mg | ORAL_TABLET | Freq: Two times a day (BID) | ORAL | 0 refills | Status: DC | PRN

## 2018-11-15 NOTE — Progress Notes (Signed)
Subjective: CC: Anxiety, hypertension,?  COPD PCP: Janora Norlander, DO Gabrielle Gallegos is a 63 y.o. female presenting to clinic today for:  1.  Anxiety/ depression History: Longstanding history of anxiety and depressive symptoms.  Previously treated with Cymbalta but this was not effective.  No history of hospitalizations for mental health disorder  Patient last seen in January and started on Zoloft 50 mg daily.  She notes that her depressive symptoms have certainly improved and she finds more motivation to perform normal activities.  However, her stress level at home continues to be quite elevated.  Again she reiterates that she has a disabled daughter who is totally dependent upon her for ADLs.  She does report that they recently got more hours with a personal care service aide which has helped some.  Her husband has not yet returned to work, he has been out of work for a disabling accident.  She continues to have intermittent episodes of feeling overwhelmed.  She will often try to meditate through the episodes but occasionally will find the need to use the Ativan that was given to her at last visit.  She still has 2 remaining tablets of the 20 that were given to her 2 months ago.  She has been using them very sparingly.  Denies any excessive sedation, respiratory depression or falls.  2.  Hypertension/tachycardia Patient saw her cardiologist recently who increased her Coreg to 37.5 mg twice daily.  She notes that the heart rate has been doing better, it has come down from 113 to low 100s.  Blood pressure has also come down some.  No chest pain, shortness of breath, dizziness or falls.  3. ?  COPD She has scheduled appointment with pulmonology later this week.  Again, was previously on Spiriva but then transitioned over to Incruse after her insurance would no longer pay for Spiriva.  She has been off of inhalers for a while now.  She would like to have something on hand in 1 to see if we  had a sample today.  ROS: Per HPI  Allergies  Allergen Reactions  . Diltiazem Shortness Of Breath    , bloated  . Metoprolol Nausea And Vomiting   Past Medical History:  Diagnosis Date  . Acute exacerbation of chronic obstructive pulmonary disease ([place])    05-27-2016 dx started on omnicef, oral prednisone  . Bladder tumor   . COPD (chronic obstructive pulmonary disease) ([place])   . Depression   . Fatigue   . GAD (generalized anxiety disorder)   . History of adenomatous polyp of colon    tubular adenoma's  10-30-2015  . History of bladder stone    01/ 2008  . History of chronic bronchitis    last bout 04-02-2016  . History of kidney stones   . History of panic attacks   . Hyperlipidemia   . Mixed stress and urge urinary incontinence   . Nephrolithiasis    bilateral nonobstructive per ct 05/ 2017  . Neuropathy   . Productive cough   . Renal cyst, left   . RLS (restless legs syndrome)   . Tachycardia 05/31/2014  . Tachycardia, paroxysmal [facility])    last cardiologist-  dr Mare Ferrari  09/ 2015  felt to stress/ anxiety related  . Urgency of urination   . Wears glasses     Current Outpatient Medications:  .  carvedilol (COREG) 25 MG tablet, Take 1.5 tablets (37.5 mg total) by mouth 2 (two) times daily with a  meal., Disp: 270 tablet, Rfl: 2 .  Cholecalciferol 1.25 MG (50000 UT) capsule, Take 1 capsule (50,000 Units total) by mouth every 7 (seven) days for 12 doses., Disp: 12 capsule, Rfl: 0 .  gabapentin (NEURONTIN) 300 MG capsule, Take 1 capsule (300 mg total) by mouth 3 (three) times daily., Disp: 180 capsule, Rfl: 2 .  LORazepam (ATIVAN) 0.5 MG tablet, Take 0.5-1 tablets (0.25-0.5 mg total) by mouth 2 (two) times daily as needed for anxiety., Disp: 20 tablet, Rfl: 0 .  losartan (COZAAR) 25 MG tablet, Take 1 tablet (25 mg total) by mouth daily., Disp: 30 tablet, Rfl: 1 .  omeprazole (PRILOSEC) 20 MG capsule, Take 1 capsule (20 mg total) by mouth daily., Disp: 90 capsule, Rfl: 3 .   pramipexole (MIRAPEX) 0.5 MG tablet, TAKE 1 TABLET BY MOUTH ONCE DAILY AT BEDTIME AS NEEDED MAY  REPEAT  1  TIME  IF  NEEDED, Disp: 40 tablet, Rfl: 3 .  sertraline (ZOLOFT) 50 MG tablet, Take 1 tablet (50 mg total) by mouth daily., Disp: 30 tablet, Rfl: 1 .  tiZANidine (ZANAFLEX) 4 MG tablet, TAKE 1/2 TO 1 (ONE-HALF TO ONE) TABLET BY MOUTH EVERY 8 HOURS AS NEEDED FOR MUSCLE SPASM, Disp: 30 tablet, Rfl: 2 .  traZODone (DESYREL) 50 MG tablet, TAKE 1/2 TO 1 (ONE-HALF TO ONE) TABLET BY MOUTH AT BEDTIME AS NEEDED FOR SLEEP, Disp: 90 tablet, Rfl: 1 .  VENTOLIN HFA 108 (90 Base) MCG/ACT inhaler, INHALE 2 PUFFS BY MOUTH EVERY 6 HOURS AS NEEDED FOR WHEEZING OR SHORTNESS OF BREATH, Disp: 18 g, Rfl: 5 Social History   Socioeconomic History  . Marital status: Married    Spouse name: Not on file  . Number of children: Not on file  . Years of education: Not on file  . Highest education level: Not on file  Occupational History  . Not on file  Social Needs  . Financial resource strain: Not on file  . Food insecurity:    Worry: Not on file    Inability: Not on file  . Transportation needs:    Medical: Not on file    Non-medical: Not on file  Tobacco Use  . Smoking status: Current Every Day Smoker    Packs/day: 0.10    Years: 30.00    Pack years: 3.00    Types: Cigarettes  . Smokeless tobacco: Never Used  Substance and Sexual Activity  . Alcohol use: No    Alcohol/week: 0.0 standard drinks  . Drug use: No  . Sexual activity: Not on file  Lifestyle  . Physical activity:    Days per week: Not on file    Minutes per session: Not on file  . Stress: Not on file  Relationships  . Social connections:    Talks on phone: Not on file    Gets together: Not on file    Attends religious service: Not on file    Active member of club or organization: Not on file    Attends meetings of clubs or organizations: Not on file    Relationship status: Not on file  . Intimate partner violence:    Fear of  current or ex partner: Not on file    Emotionally abused: Not on file    Physically abused: Not on file    Forced sexual activity: Not on file  Other Topics Concern  . Not on file  Social History Narrative  . Not on file   Family History  Problem Relation Age  of Onset  . Heart disease Mother   . Cancer Father   . Lung cancer Sister   . Diabetes Brother     Objective: Office vital signs reviewed. BP 138/80   Pulse (!) 104   Temp 97.8 F (36.6 C) (Oral)   Ht 5\' 4"  (1.626 m)   Wt 135 lb (61.2 kg)   BMI 23.17 kg/m   Physical Examination:  General: Awake, alert, well nourished, No acute distress HEENT: Normal, sclera white, MMM Cardio: Tachycardic with regular rhythm, S1S2 heard, no murmurs appreciated Pulm: clear to auscultation bilaterally, no wheezes, rhonchi or rales; normal work of breathing on room air Psych: Mood stable, speech normal, affect appropriate, pleasant and interactive.  Depression screen [facility] 2/9 11/15/2018 10/04/2018 02/08/2018  Decreased Interest 0 0 0  Down, Depressed, Hopeless 0 0 0  PHQ - 2 Score 0 0 0  Altered sleeping 3 0 -  Tired, decreased energy 2 1 -  Change in appetite 0 1 -  Feeling bad or failure about yourself  0 0 -  Trouble concentrating 1 0 -  Moving slowly or fidgety/restless 0 0 -  Suicidal thoughts 0 0 -  PHQ-9 Score 6 2 -   GAD 7 : Generalized Anxiety Score 11/15/2018 10/04/2018  Nervous, Anxious, on Edge 2 3  Control/stop worrying 3 3  Worry too much - different things 3 3  Trouble relaxing 3 2  Restless 2 1  Easily annoyed or irritable 3 3  Afraid - awful might happen 2 3  Total GAD 7 Score 18 18  Anxiety Difficulty Somewhat difficult Very difficult    Assessment/ Plan: 63 y.o. female   1. Essential hypertension Blood pressure under control.  Continue current regimen as prescribed by cardiology  2. Tachycardia, paroxysmal (HCC) Heart rate is somewhat improved.  Continue increased dose of beta-blocker  3. Major  depressive disorder with single episode, in partial remission ([place]) Continues to have depressive and anxiety symptoms despite initiation of Zoloft.  Increase to 100 mg daily.  I have given her another small supply of Ativan and again we discussed that this was not intended for long-term use but simply for titration onto SSRI.  If she has persistent anxiety symptoms at next visit, we will plan to add BuSpar versus Atarax.  I favor BuSpar since she would like to avoid any sedation given her primary role in the household.  Follow-up in 6 to 8 weeks for recheck.  4. GAD (generalized anxiety disorder) As above. The Narcotic Database has been reviewed.  There were no red flags.     No orders of the defined types were placed in this encounter.  Meds ordered this encounter  Medications  . LORazepam (ATIVAN) 0.5 MG tablet    Sig: Take 0.5-1 tablets (0.25-0.5 mg total) by mouth 2 (two) times daily as needed for anxiety.    Dispense:  20 tablet    Refill:  0  . sertraline (ZOLOFT) 100 MG tablet    Sig: Take 1 tablet (100 mg total) by mouth daily.    Dispense:  90 tablet    Refill:  0  . Tiotropium Bromide Monohydrate (SPIRIVA RESPIMAT) 2.5 MCG/ACT AERS    Sig: Inhale 1 puff into the lungs daily.    Dispense:  1 Inhaler    Refill:  [place], [place] [phone]

## 2018-11-15 NOTE — Patient Instructions (Signed)
Blood pressure looks ok today.  I have increased the Zoloft dose to 100 mg daily.  You may take 2 of your 50 mg tablets until you finish your current bottle then switch to 1 tablet of the 100 mg daily.  I have renewed your Lorazepam prescription.  Again, we discussed that this is only intended for short-term use while you titrate your medication.  Take these very sparingly.  Do not operate heavy machinery or drive while taking the lorazepam.  I gave you a sample of Spiriva today.  This is the same medicine that you are on before the Incruse.  Hold off on starting it until you see the lung specialist later this week

## 2018-11-17 ENCOUNTER — Institutional Professional Consult (permissible substitution): Payer: Self-pay | Admitting: Pulmonary Disease

## 2018-12-06 ENCOUNTER — Institutional Professional Consult (permissible substitution): Payer: Self-pay | Admitting: Pulmonary Disease

## 2018-12-27 ENCOUNTER — Other Ambulatory Visit: Payer: Self-pay

## 2018-12-27 ENCOUNTER — Ambulatory Visit (INDEPENDENT_AMBULATORY_CARE_PROVIDER_SITE_OTHER): Payer: Managed Care, Other (non HMO) | Admitting: Family Medicine

## 2018-12-27 DIAGNOSIS — N393 Stress incontinence (female) (male): Secondary | ICD-10-CM

## 2018-12-27 DIAGNOSIS — K6289 Other specified diseases of anus and rectum: Secondary | ICD-10-CM

## 2018-12-27 DIAGNOSIS — F324 Major depressive disorder, single episode, in partial remission: Secondary | ICD-10-CM

## 2018-12-27 DIAGNOSIS — J449 Chronic obstructive pulmonary disease, unspecified: Secondary | ICD-10-CM | POA: Diagnosis not present

## 2018-12-27 DIAGNOSIS — I10 Essential (primary) hypertension: Secondary | ICD-10-CM | POA: Diagnosis not present

## 2018-12-27 DIAGNOSIS — F411 Generalized anxiety disorder: Secondary | ICD-10-CM | POA: Diagnosis not present

## 2018-12-27 MED ORDER — TIOTROPIUM BROMIDE MONOHYDRATE 2.5 MCG/ACT IN AERS: 1.0000 | INHALATION_SPRAY | Freq: Every day | RESPIRATORY_TRACT | 0 refills | Status: DC

## 2018-12-27 MED ORDER — TRAZODONE HCL 100 MG PO TABS: 50.0000 mg | ORAL_TABLET | Freq: Every evening | ORAL | 1 refills | Status: DC | PRN

## 2018-12-27 MED ORDER — LOSARTAN POTASSIUM 25 MG PO TABS: 25.0000 mg | ORAL_TABLET | Freq: Every day | ORAL | 1 refills | Status: DC

## 2018-12-27 NOTE — Progress Notes (Signed)
Telephone visit  Subjective: CC: f/u anxiety/ depression PCP: Janora Norlander, DO Gabrielle Gallegos is a 63 y.o. female calls for telephone consult today. Patient provides verbal consent for consult held via phone.  Location of patient: home Location of provider: Working remotely from home Others present for call: none  1. Anxiety/ depression Patient was last seen in March for anxiety and depressive symptoms.  She had at that time continue to have significant anxiety and depression despite use of SSRI.  She reported symptoms of inability to deal with stress at home surrounding the care of her disabled daughter, who is totally dependent upon her for ADLs.  She was feeling overwhelmed.  Her Zoloft was increased to 100 mg daily and she was provided another bridge supply of Ativan to use as needed during titration of SSRI.  She reports that symptoms have been better than her last visit.  She continues to have some difficulty with sleep despite use of trazodone 50 mg.  Often, she will have problems falling asleep but no difficulty staying asleep.  She continues to have anxiety surrounding the care of her daughter but states that her husband is now back at work so that has improved the stress levels a bit.  2. COPD Patient was scheduled to see pulmonology last month but she was unfortunately not able to see them because of the COVID-19 outbreak.  She does report that the Spiriva Respimat seems to be helping quite a bit with her breathing.  Breathing seems to be worse with moderate physical exercise like lifting, pushing or pulling.  She would like this sent to her pharmacy.  Additionally, 2 reiterates she tried to use Incruse which was preferred by her insurance company but this was not helpful.  No coughing, fevers or hemoptysis.  3.  Hypertension Patient reports compliance with antihypertensives.  She needs a refill on losartan.  No chest pain or shortness of breath.  4.  Urinary  incontinence Patient reports that she has some leakage with increased abdominal pressure.  She specifically cites when she lifts her daughter or any other heavy objects she leaks.  She otherwise urinates normally.  She notes that she does not typically drink a lot of fluid.  5.  Rectal pain Patient reports that she has had a several week history of intermittent rectal pain which predominantly seems to happen at night.  She has been woken from sleep with his rectal pain.  She looked at her rectum and thought that it looked "meaty".  Denies any specific masses or hemorrhoids visualized.  She denies any bleeding.  She denies any constipation or straining during defecation.  Pain is not elicited specifically during bowel movements.  No unplanned weight loss, nausea or vomiting.  She had a colonoscopy performed in 2017 and advised to follow-up for repeat colonoscopy 3 years later.  Unfortunately because of the COVID-19 outbreak she has not yet done this.   ROS: Per HPI  Allergies  Allergen Reactions  . Diltiazem Shortness Of Breath    , bloated  . Metoprolol Nausea And Vomiting   Past Medical History:  Diagnosis Date  . Acute exacerbation of chronic obstructive pulmonary disease ([place])    05-27-2016 dx started on omnicef, oral prednisone  . Bladder tumor   . COPD (chronic obstructive pulmonary disease) ([place])   . Depression   . Fatigue   . GAD (generalized anxiety disorder)   . History of adenomatous polyp of colon    tubular adenoma's  10-30-2015  .  History of bladder stone    01/ 2008  . History of chronic bronchitis    last bout 04-02-2016  . History of kidney stones   . History of panic attacks   . Hyperlipidemia   . Mixed stress and urge urinary incontinence   . Nephrolithiasis    bilateral nonobstructive per ct 05/ 2017  . Neuropathy   . Productive cough   . Renal cyst, left   . RLS (restless legs syndrome)   . Tachycardia 05/31/2014  . Tachycardia, paroxysmal [facility])    last  cardiologist-  dr Mare Ferrari  09/ 2015  felt to stress/ anxiety related  . Urgency of urination   . Wears glasses     Current Outpatient Medications:  .  carvedilol (COREG) 25 MG tablet, Take 1.5 tablets (37.5 mg total) by mouth 2 (two) times daily with a meal., Disp: 270 tablet, Rfl: 2 .  gabapentin (NEURONTIN) 300 MG capsule, Take 1 capsule (300 mg total) by mouth 3 (three) times daily., Disp: 180 capsule, Rfl: 2 .  LORazepam (ATIVAN) 0.5 MG tablet, Take 0.5-1 tablets (0.25-0.5 mg total) by mouth 2 (two) times daily as needed for anxiety., Disp: 20 tablet, Rfl: 0 .  losartan (COZAAR) 25 MG tablet, Take 1 tablet (25 mg total) by mouth daily., Disp: 30 tablet, Rfl: 1 .  omeprazole (PRILOSEC) 20 MG capsule, Take 1 capsule (20 mg total) by mouth daily., Disp: 90 capsule, Rfl: 3 .  pramipexole (MIRAPEX) 0.5 MG tablet, TAKE 1 TABLET BY MOUTH ONCE DAILY AT BEDTIME AS NEEDED MAY  REPEAT  1  TIME  IF  NEEDED, Disp: 40 tablet, Rfl: 3 .  sertraline (ZOLOFT) 100 MG tablet, Take 1 tablet (100 mg total) by mouth daily., Disp: 90 tablet, Rfl: 0 .  Tiotropium Bromide Monohydrate (SPIRIVA RESPIMAT) 2.5 MCG/ACT AERS, Inhale 1 puff into the lungs daily., Disp: 1 Inhaler, Rfl: 0 .  tiZANidine (ZANAFLEX) 4 MG tablet, TAKE 1/2 TO 1 (ONE-HALF TO ONE) TABLET BY MOUTH EVERY 8 HOURS AS NEEDED FOR MUSCLE SPASM, Disp: 30 tablet, Rfl: 2 .  traZODone (DESYREL) 50 MG tablet, TAKE 1/2 TO 1 (ONE-HALF TO ONE) TABLET BY MOUTH AT BEDTIME AS NEEDED FOR SLEEP, Disp: 90 tablet, Rfl: 1 .  VENTOLIN HFA 108 (90 Base) MCG/ACT inhaler, INHALE 2 PUFFS BY MOUTH EVERY 6 HOURS AS NEEDED FOR WHEEZING OR SHORTNESS OF BREATH, Disp: 18 g, Rfl: 5  Assessment/ Plan: 63 y.o. female   1. GAD (generalized anxiety disorder) Under better control w/ Zoloft.  Plan to discontinue Ativan totally over the next couple of months. - traZODone (DESYREL) 100 MG tablet; Take 0.5-1 tablets (50-100 mg total) by mouth at bedtime as needed for sleep.  Dispense: 90  tablet; Refill: 1  2. Major depressive disorder with single episode, in partial remission (HCC) Insomnia not controlled.  Increase dose of Trazodone sent.  Continue Zoloft 100mg  daily - traZODone (DESYREL) 100 MG tablet; Take 0.5-1 tablets (50-100 mg total) by mouth at bedtime as needed for sleep.  Dispense: 90 tablet; Refill: 1  3. Chronic obstructive pulmonary disease, unspecified COPD type ([place]) Did not respond to incruse inhaler but is doing well on spiriva.  I have sent to pharmacy.  May need PriorAuth. - Tiotropium Bromide Monohydrate (SPIRIVA RESPIMAT) 2.5 MCG/ACT AERS; Inhale 1 puff into the lungs daily.  Dispense: 1 Inhaler; Refill: 0  4. Essential hypertension Controlled.  Cozaar renewed. - losartan (COZAAR) 25 MG tablet; Take 1 tablet (25 mg total) by mouth daily.  Dispense: 90 tablet; Refill: 1  5. Stress incontinence in female Recommended Kegel exercises.  6. Rectal pain Likely proctagia fugax but we discussed that rectal prolapse, hemorrhoid and fissure were to be considered.  No red flags. We discussed reasons needing urgent/ emergent evaluation.   Start time: 1:33pm End time: 1:54pm  Total time spent on patient care (including telephone call/ virtual visit): 31 minutes  [place], [place] [phone]

## 2018-12-27 NOTE — Patient Instructions (Signed)
Kegel Exercises Kegel exercises help strengthen the muscles that support the rectum, vagina, small intestine, bladder, and uterus. Doing Kegel exercises can help:  Improve bladder and bowel control.  Improve sexual response.  Reduce problems and discomfort during pregnancy. Kegel exercises involve squeezing your pelvic floor muscles, which are the same muscles you squeeze when you try to stop the flow of urine. The exercises can be done while sitting, standing, or lying down, but it is best to vary your position. Exercises 1. Squeeze your pelvic floor muscles tight. You should feel a tight lift in your rectal area. If you are a female, you should also feel a tightness in your vaginal area. Keep your stomach, buttocks, and legs relaxed. 2. Hold the muscles tight for up to 10 seconds. 3. Relax your muscles. Repeat this exercise 50 times a day or as many times as told by your health care provider. Continue to do this exercise for at least 4-6 weeks or for as long as told by your health care provider. This information is not intended to replace advice given to you by your health care provider. Make sure you discuss any questions you have with your health care provider. Document Released: 08/10/2012 Document Revised: 01/04/2017 Document Reviewed: 07/14/2015 Elsevier Interactive Patient Education  2019 [place]  Proctalgia fugax is a condition that involves very short episodes of intense pain in the rectum. The rectum is the last part of the large intestine. The pain can last from seconds to minutes. Episodes often occur during the night and awaken the person from sleep. This condition is not a sign of cancer, but your health care provider may want to rule out a number of other conditions. What are the causes? The cause of this condition is not known. One possible cause may be spasm of the pelvic muscles or of the lowest part of the large intestine. What are the signs or  symptoms? The only symptom of this condition is rectal pain.  The pain may be intense or severe.  The pain may last for only a few seconds or it may last up to 30 minutes.  The pain may occur at night and wake you up from sleep. How is this diagnosed? This condition may be diagnosed by ruling out other problems that could cause the pain. Diagnosis may include:  Medical history and physical exam.  Various tests, such as: ? Anoscopy. In this test, a lighted scope is put into the rectum to look for abnormalities. ? Barium enema. In this test, X-rays are taken after a white chalky substance called barium is put into the colon. The barium makes it easier to see problems because it shows up well on the X-rays. ? Blood tests to rule out infections or other problems. How is this treated? There is no specific treatment to cure this condition. Treatment options may include:  Medicines.  Warm baths.  Relaxation techniques.  Gentle massage of the painful area.  Biofeedback. Follow these instructions at home:  Take over-the-counter and prescription medicines only as told by your health care provider.  Follow instructions from your health care provider about diet.  Follow instructions from your health care provider about rest and physical activity.  Try warm baths, massaging the area, or progressive relaxation techniques as told by your health care provider.  Keep all follow-up visits as told by your health care provider. This is important. Contact a health care provider if:  You develop new symptoms.  Your pain does not get better as soon as it usually does. This information is not intended to replace advice given to you by your health care provider. Make sure you discuss any questions you have with your health care provider. Document Released: 05/19/2001 Document Revised: 01/30/2016 Document Reviewed: 11/19/2014 Elsevier Interactive Patient Education  2019 [employer].

## 2019-01-11 ENCOUNTER — Telehealth: Payer: Self-pay | Admitting: Family Medicine

## 2019-01-11 NOTE — Telephone Encounter (Signed)
LM for pt to call back sign up for mychart

## 2019-01-18 NOTE — Telephone Encounter (Signed)
Medication changed by provider in order only encounter on 05/02/18

## 2019-01-24 ENCOUNTER — Ambulatory Visit (INDEPENDENT_AMBULATORY_CARE_PROVIDER_SITE_OTHER): Payer: Managed Care, Other (non HMO) | Admitting: Family Medicine

## 2019-01-24 ENCOUNTER — Other Ambulatory Visit: Payer: Self-pay

## 2019-01-24 DIAGNOSIS — F411 Generalized anxiety disorder: Secondary | ICD-10-CM

## 2019-01-24 DIAGNOSIS — M545 Low back pain, unspecified: Secondary | ICD-10-CM

## 2019-01-24 DIAGNOSIS — F410 Panic disorder [episodic paroxysmal anxiety] without agoraphobia: Secondary | ICD-10-CM | POA: Diagnosis not present

## 2019-01-24 DIAGNOSIS — M4120 Other idiopathic scoliosis, site unspecified: Secondary | ICD-10-CM | POA: Diagnosis not present

## 2019-01-24 DIAGNOSIS — G8929 Other chronic pain: Secondary | ICD-10-CM

## 2019-01-24 MED ORDER — TIZANIDINE HCL 4 MG PO TABS: ORAL_TABLET | ORAL | 2 refills | Status: DC

## 2019-01-24 MED ORDER — LORAZEPAM 0.5 MG PO TABS: 0.2500 mg | ORAL_TABLET | Freq: Two times a day (BID) | ORAL | 0 refills | Status: DC | PRN

## 2019-01-24 NOTE — Progress Notes (Signed)
Telephone visit  Subjective: CC:f/u anxiety/ pain PCP: Janora Norlander, DO Gabrielle Gallegos is a 63 y.o. female calls for telephone consult today. Patient provides verbal consent for consult held via phone.  Location of patient: home Location of provider: [facility] Others present for call: none  1. Anxiety/ depression She reports moderate control with Zoloft but does report that lately, she has been having increased stress related to her family.  She has been using the Ativan a little bit more often than normal.  She denies any decreased respiratory drive, confusion, dizziness or falls.  She does report some improved sleep with the trazodone at 50 to 100 mg daily at bedtime.  2.  Chronic back pain Patient with ongoing right-sided low back pain.  This does not radiate.  She does not have any sensation changes in the lower extremity.  She has seen the specialist and there are plans for MRI but she cannot afford this at this time and wishes to continue medical treatment.  She is taking gabapentin as well as muscle relaxer on a fairly daily basis.  She needs a refill on the muscle relaxer   ROS: Per HPI  Allergies  Allergen Reactions  . Diltiazem Shortness Of Breath    , bloated  . Metoprolol Nausea And Vomiting   Past Medical History:  Diagnosis Date  . Acute exacerbation of chronic obstructive pulmonary disease ([place])    05-27-2016 dx started on omnicef, oral prednisone  . Bladder tumor   . COPD (chronic obstructive pulmonary disease) ([place])   . Depression   . Fatigue   . GAD (generalized anxiety disorder)   . History of adenomatous polyp of colon    tubular adenoma's  10-30-2015  . History of bladder stone    01/ 2008  . History of chronic bronchitis    last bout 04-02-2016  . History of kidney stones   . History of panic attacks   . Hyperlipidemia   . Mixed stress and urge urinary incontinence   . Nephrolithiasis    bilateral nonobstructive per ct 05/ 2017  .  Neuropathy   . Productive cough   . Renal cyst, left   . RLS (restless legs syndrome)   . Tachycardia 05/31/2014  . Tachycardia, paroxysmal [facility])    last cardiologist-  dr Mare Ferrari  09/ 2015  felt to stress/ anxiety related  . Urgency of urination   . Wears glasses     Current Outpatient Medications:  .  carvedilol (COREG) 25 MG tablet, Take 1.5 tablets (37.5 mg total) by mouth 2 (two) times daily with a meal., Disp: 270 tablet, Rfl: 2 .  gabapentin (NEURONTIN) 300 MG capsule, Take 1 capsule (300 mg total) by mouth 3 (three) times daily., Disp: 180 capsule, Rfl: 2 .  LORazepam (ATIVAN) 0.5 MG tablet, Take 0.5-1 tablets (0.25-0.5 mg total) by mouth 2 (two) times daily as needed for anxiety., Disp: 20 tablet, Rfl: 0 .  losartan (COZAAR) 25 MG tablet, Take 1 tablet (25 mg total) by mouth daily., Disp: 90 tablet, Rfl: 1 .  meloxicam (MOBIC) 15 MG tablet, Take 15 mg by mouth daily., Disp: , Rfl:  .  omeprazole (PRILOSEC) 20 MG capsule, Take 1 capsule (20 mg total) by mouth daily., Disp: 90 capsule, Rfl: 3 .  pramipexole (MIRAPEX) 0.5 MG tablet, TAKE 1 TABLET BY MOUTH ONCE DAILY AT BEDTIME AS NEEDED MAY  REPEAT  1  TIME  IF  NEEDED, Disp: 40 tablet, Rfl: 3 .  sertraline (ZOLOFT)  100 MG tablet, Take 1 tablet (100 mg total) by mouth daily., Disp: 90 tablet, Rfl: 0 .  Tiotropium Bromide Monohydrate (SPIRIVA RESPIMAT) 2.5 MCG/ACT AERS, Inhale 1 puff into the lungs daily., Disp: 1 Inhaler, Rfl: 0 .  tiZANidine (ZANAFLEX) 4 MG tablet, TAKE 1/2 TO 1 (ONE-HALF TO ONE) TABLET BY MOUTH EVERY 8 HOURS AS NEEDED FOR MUSCLE SPASM, Disp: 30 tablet, Rfl: 2 .  traZODone (DESYREL) 100 MG tablet, Take 0.5-1 tablets (50-100 mg total) by mouth at bedtime as needed for sleep., Disp: 90 tablet, Rfl: 1 .  VENTOLIN HFA 108 (90 Base) MCG/ACT inhaler, INHALE 2 PUFFS BY MOUTH EVERY 6 HOURS AS NEEDED FOR WHEEZING OR SHORTNESS OF BREATH, Disp: 18 g, Rfl: 5  Assessment/ Plan: 63 y.o. female   1. Scoliosis (and  kyphoscoliosis), idiopathic Likely contributing to chronic lower back pain.  Zanaflex refilled.  She is using this sparingly and responsibly.  Awaiting MRI but this is not affordable at this time - tiZANidine (ZANAFLEX) 4 MG tablet; TAKE 1/2 TO 1 (ONE-HALF TO ONE) TABLET BY MOUTH EVERY 8 HOURS AS NEEDED FOR MUSCLE SPASM  Dispense: 30 tablet; Refill: 2  2. Chronic right-sided low back pain without sciatica As above - tiZANidine (ZANAFLEX) 4 MG tablet; TAKE 1/2 TO 1 (ONE-HALF TO ONE) TABLET BY MOUTH EVERY 8 HOURS AS NEEDED FOR MUSCLE SPASM  Dispense: 30 tablet; Refill: 2  3. GAD (generalized anxiety disorder) Somewhat controlled with Zoloft but has PRN need for Ativan which she uses sparingly.  Last refill was in March.  No red flag signs or symptoms.  The national cardiac database was reviewed and again there were no red flags.  May need to consider BuSpar she has a daily need for Ativan. - LORazepam (ATIVAN) 0.5 MG tablet; Take 0.5-1 tablets (0.25-0.5 mg total) by mouth 2 (two) times daily as needed for anxiety.  Dispense: 20 tablet; Refill: 0  4. Panic attacks As above - LORazepam (ATIVAN) 0.5 MG tablet; Take 0.5-1 tablets (0.25-0.5 mg total) by mouth 2 (two) times daily as needed for anxiety.  Dispense: 20 tablet; Refill: 0   Start time: 3:39pm End time: 3:47pm  Total time spent on patient care (including telephone call/ virtual visit): 15 minutes  [place], [place] [phone]

## 2019-02-20 ENCOUNTER — Other Ambulatory Visit: Payer: Self-pay | Admitting: Family Medicine

## 2019-02-20 DIAGNOSIS — J449 Chronic obstructive pulmonary disease, unspecified: Secondary | ICD-10-CM

## 2019-03-08 ENCOUNTER — Other Ambulatory Visit: Payer: Self-pay | Admitting: Family Medicine

## 2019-03-08 DIAGNOSIS — G2581 Restless legs syndrome: Secondary | ICD-10-CM

## 2019-03-31 ENCOUNTER — Other Ambulatory Visit: Payer: Self-pay | Admitting: Family Medicine

## 2019-03-31 DIAGNOSIS — G2581 Restless legs syndrome: Secondary | ICD-10-CM

## 2019-04-25 ENCOUNTER — Institutional Professional Consult (permissible substitution): Payer: Self-pay | Admitting: Pulmonary Disease

## 2019-05-10 ENCOUNTER — Other Ambulatory Visit: Payer: Self-pay | Admitting: Family Medicine

## 2019-05-10 DIAGNOSIS — M545 Low back pain, unspecified: Secondary | ICD-10-CM

## 2019-05-10 DIAGNOSIS — F410 Panic disorder [episodic paroxysmal anxiety] without agoraphobia: Secondary | ICD-10-CM

## 2019-05-10 DIAGNOSIS — F411 Generalized anxiety disorder: Secondary | ICD-10-CM

## 2019-05-10 DIAGNOSIS — G8929 Other chronic pain: Secondary | ICD-10-CM

## 2019-05-10 DIAGNOSIS — M4120 Other idiopathic scoliosis, site unspecified: Secondary | ICD-10-CM

## 2019-06-10 ENCOUNTER — Other Ambulatory Visit: Payer: Self-pay | Admitting: Family Medicine

## 2019-06-10 DIAGNOSIS — J449 Chronic obstructive pulmonary disease, unspecified: Secondary | ICD-10-CM

## 2019-06-22 ENCOUNTER — Other Ambulatory Visit: Payer: Self-pay | Admitting: Family Medicine

## 2019-06-22 DIAGNOSIS — F324 Major depressive disorder, single episode, in partial remission: Secondary | ICD-10-CM

## 2019-06-22 DIAGNOSIS — G8929 Other chronic pain: Secondary | ICD-10-CM

## 2019-06-22 DIAGNOSIS — G2581 Restless legs syndrome: Secondary | ICD-10-CM

## 2019-06-22 DIAGNOSIS — F411 Generalized anxiety disorder: Secondary | ICD-10-CM

## 2019-06-22 DIAGNOSIS — M4120 Other idiopathic scoliosis, site unspecified: Secondary | ICD-10-CM

## 2019-07-10 ENCOUNTER — Other Ambulatory Visit: Payer: Self-pay | Admitting: Family Medicine

## 2019-07-10 DIAGNOSIS — J449 Chronic obstructive pulmonary disease, unspecified: Secondary | ICD-10-CM

## 2019-07-26 ENCOUNTER — Other Ambulatory Visit: Payer: Self-pay | Admitting: Family Medicine

## 2019-07-26 DIAGNOSIS — F324 Major depressive disorder, single episode, in partial remission: Secondary | ICD-10-CM

## 2019-07-26 DIAGNOSIS — J449 Chronic obstructive pulmonary disease, unspecified: Secondary | ICD-10-CM

## 2019-07-26 DIAGNOSIS — G8929 Other chronic pain: Secondary | ICD-10-CM

## 2019-07-26 DIAGNOSIS — F411 Generalized anxiety disorder: Secondary | ICD-10-CM

## 2019-07-26 DIAGNOSIS — M4120 Other idiopathic scoliosis, site unspecified: Secondary | ICD-10-CM

## 2019-07-26 DIAGNOSIS — G2581 Restless legs syndrome: Secondary | ICD-10-CM

## 2019-07-26 DIAGNOSIS — K2100 Gastro-esophageal reflux disease with esophagitis, without bleeding: Secondary | ICD-10-CM

## 2019-07-28 NOTE — Telephone Encounter (Signed)
NOV

## 2019-08-08 ENCOUNTER — Other Ambulatory Visit: Payer: Self-pay

## 2019-08-09 ENCOUNTER — Ambulatory Visit (INDEPENDENT_AMBULATORY_CARE_PROVIDER_SITE_OTHER): Payer: Managed Care, Other (non HMO) | Admitting: Family Medicine

## 2019-08-09 ENCOUNTER — Encounter: Payer: Self-pay | Admitting: Family Medicine

## 2019-08-09 ENCOUNTER — Ambulatory Visit (INDEPENDENT_AMBULATORY_CARE_PROVIDER_SITE_OTHER): Payer: Managed Care, Other (non HMO)

## 2019-08-09 VITALS — BP 193/118 | HR 125 | Temp 97.6°F | Ht 64.0 in | Wt 135.0 lb

## 2019-08-09 DIAGNOSIS — R10A0 Flank pain, unspecified side: Secondary | ICD-10-CM

## 2019-08-09 DIAGNOSIS — N3001 Acute cystitis with hematuria: Secondary | ICD-10-CM

## 2019-08-09 DIAGNOSIS — R109 Unspecified abdominal pain: Secondary | ICD-10-CM

## 2019-08-09 DIAGNOSIS — Z87442 Personal history of urinary calculi: Secondary | ICD-10-CM | POA: Diagnosis not present

## 2019-08-09 DIAGNOSIS — I10 Essential (primary) hypertension: Secondary | ICD-10-CM | POA: Diagnosis not present

## 2019-08-09 LAB — URINALYSIS, COMPLETE
Bilirubin, UA: NEGATIVE
Glucose, UA: NEGATIVE
Ketones, UA: NEGATIVE
Nitrite, UA: NEGATIVE
Specific Gravity, UA: 1.02 (ref 1.005–1.030)
Urobilinogen, Ur: 1 mg/dL (ref 0.2–1.0)
pH, UA: 6.5 (ref 5.0–7.5)

## 2019-08-09 LAB — MICROSCOPIC EXAMINATION
Epithelial Cells (non renal): >10 /hpf — AB (ref 0–10)
Renal Epithel, UA: NONE SEEN /hpf

## 2019-08-09 MED ORDER — CEFTRIAXONE SODIUM 1 G IJ SOLR
1.0000 g | Freq: Once | INTRAMUSCULAR | Status: AC
  Administered 2019-08-09: 1 g via INTRAMUSCULAR

## 2019-08-09 MED ORDER — FLUCONAZOLE 150 MG PO TABS: 150.0000 mg | ORAL_TABLET | Freq: Once | ORAL | 0 refills | Status: AC

## 2019-08-09 MED ORDER — CEPHALEXIN 500 MG PO CAPS: 500.0000 mg | ORAL_CAPSULE | Freq: Four times a day (QID) | ORAL | 0 refills | Status: AC

## 2019-08-09 NOTE — Progress Notes (Signed)
Subjective: CC: Flank pain PCP: Gabrielle Norlander, DO HQI:ONGEX S Clay is a 63 y.o. female presenting to clinic today for:  1.  flank pain Patient reports bilateral flank pain that has been severe over the last 5 days.  This radiates into the lower back as well.  Denies any associated fevers, nausea, vomiting.  She does report hematuria and some mild burning with urination.  She does report a personal history of renal stones requiring stent and surgical intervention in the past.  ROS: Per HPI  Allergies  Allergen Reactions   Diltiazem Shortness Of Breath    , bloated   Metoprolol Nausea And Vomiting   Past Medical History:  Diagnosis Date   Acute exacerbation of chronic obstructive pulmonary disease ([place])    05-27-2016 dx started on omnicef, oral prednisone   Bladder tumor    COPD (chronic obstructive pulmonary disease) ([place])    Depression    Fatigue    GAD (generalized anxiety disorder)    History of adenomatous polyp of colon    tubular adenoma's  10-30-2015   History of bladder stone    01/ 2008   History of chronic bronchitis    last bout 04-02-2016   History of kidney stones    History of panic attacks    Hyperlipidemia    Mixed stress and urge urinary incontinence    Nephrolithiasis    bilateral nonobstructive per ct 05/ 2017   Neuropathy    Productive cough    Renal cyst, left    RLS (restless legs syndrome)    Tachycardia 05/31/2014   Tachycardia, paroxysmal ([place])    last cardiologist-  dr Mare Ferrari  09/ 2015  felt to stress/ anxiety related   Urgency of urination    Wears glasses     Current Outpatient Medications:    carvedilol (COREG) 25 MG tablet, Take 1.5 tablets (37.5 mg total) by mouth 2 (two) times daily with a meal., Disp: 270 tablet, Rfl: 2   gabapentin (NEURONTIN) 300 MG capsule, Take 1 capsule (300 mg total) by mouth 3 (three) times daily. (Needs to be seen before next refill), Disp: 180 capsule, Rfl: 0    LORazepam (ATIVAN) 0.5 MG tablet, Take 0.5-1 tablets (0.25-0.5 mg total) by mouth 2 (two) times daily as needed for anxiety., Disp: 20 tablet, Rfl: 0   losartan (COZAAR) 25 MG tablet, Take 1 tablet (25 mg total) by mouth daily., Disp: 90 tablet, Rfl: 1   meloxicam (MOBIC) 15 MG tablet, Take 15 mg by mouth daily., Disp: , Rfl:    omeprazole (PRILOSEC) 20 MG capsule, Take 1 capsule (20 mg total) by mouth daily. (Needs to be seen before next refill), Disp: 30 capsule, Rfl: 0   pramipexole (MIRAPEX) 0.5 MG tablet, TAKE 1 TABLET BY MOUTH AT BEDTIME. MAY REPEAT 1 TIME IF NEEDED, Disp: 40 tablet, Rfl: 0   sertraline (ZOLOFT) 100 MG tablet, Take 1 tablet (100 mg total) by mouth daily., Disp: 90 tablet, Rfl: 0   SPIRIVA RESPIMAT 2.5 MCG/ACT AERS, INHALE 1 SPRAY(S) BY MOUTH ONCE DAILY (PLEASE MAKE 6 MONTHS APPOINTMENT), Disp: 4 g, Rfl: 0   traZODone (DESYREL) 100 MG tablet, TAKE 1/2 TO 1 (ONE-HALF TO ONE) TABLET BY MOUTH AT BEDTIME AS NEEDED FOR SLEEP, Disp: 90 tablet, Rfl: 0   VENTOLIN HFA 108 (90 Base) MCG/ACT inhaler, INHALE 2 PUFFS BY MOUTH EVERY 6 HOURS AS NEEDED FOR WHEEZING OR SHORTNESS OF BREATH, Disp: 18 g, Rfl: 5   INCRUSE ELLIPTA 62.5 MCG/INH  AEPB, , Disp: , Rfl:  Social History   Socioeconomic History   Marital status: Married    Spouse name: Not on file   Number of children: Not on file   Years of education: Not on file   Highest education level: Not on file  Occupational History   Not on file  Social Needs   Financial resource strain: Not on file   Food insecurity    Worry: Not on file    Inability: Not on file   Transportation needs    Medical: Not on file    Non-medical: Not on file  Tobacco Use   Smoking status: Current Every Day Smoker    Packs/day: 0.10    Years: 30.00    Pack years: 3.00    Types: Cigarettes   Smokeless tobacco: Never Used  Substance and Sexual Activity   Alcohol use: No    Alcohol/week: 0.0 standard drinks   Drug use: No    Sexual activity: Not on file  Lifestyle   Physical activity    Days per week: Not on file    Minutes per session: Not on file   Stress: Not on file  Relationships   Social connections    Talks on phone: Not on file    Gets together: Not on file    Attends religious service: Not on file    Active member of club or organization: Not on file    Attends meetings of clubs or organizations: Not on file    Relationship status: Not on file   Intimate partner violence    Fear of current or ex partner: Not on file    Emotionally abused: Not on file    Physically abused: Not on file    Forced sexual activity: Not on file  Other Topics Concern   Not on file  Social History Narrative   Not on file   Family History  Problem Relation Age of Onset   Heart disease Mother    Cancer Father    Lung cancer Sister    Diabetes Brother     Objective: Office vital signs reviewed. BP (!) 193/118    Pulse (!) 125    Temp 97.6 F (36.4 C) (Temporal)    Ht 5\' 4"  (1.626 m)    Wt 135 lb (61.2 kg)    SpO2 93%    BMI 23.17 kg/m   Physical Examination:  General: Awake, alert, chronically ill-appearing. No acute distress GU: No suprapubic tenderness to palpation.  She does have bilateral mild CVA tenderness palpation.  No results found for this or any previous visit (from the past 24 hour(s)).  Kub  Result Date: 08/09/2019 CLINICAL DATA:  Flank pain. History of renal stones. EXAM: ABDOMEN - 1 VIEW COMPARISON:  Radiograph 02/08/2018. CT 01/23/2016 FINDINGS: No radiopaque calculi project over the renal beds, course of the ureters, or in the pelvis. Nonobstructive bowel gas pattern. No evidence of free air. Slight increased air within the transverse and descending colon, nondilated. No acute osseous abnormalities are seen. IMPRESSION: 1. No radiographic evidence of urolithiasis. 2. Increased air within the transverse and descending colon in a nonobstructive pattern. Electronically Signed   By:  Keith Rake M.D.   On: 08/09/2019 15:09   Assessment/ Plan: 62 y.o. female   1. Acute cystitis with hematuria I am going to empirically treat her as a pyelonephritis given reports of flank pain.  She was given a dose of Rocephin here in office.  I prescribed  her Keflex 4 times daily for 7 days.  Sent her urine for urine culture I will contact her with the results of this. - urinalysis- dip and micro - KUB; Future - cefTRIAXone (ROCEPHIN) injection 1 g - cephALEXin (KEFLEX) 500 MG capsule; Take 1 capsule (500 mg total) by mouth 4 (four) times daily for 7 days.  Dispense: 28 capsule; Refill: 0 - Urine culture  2. Flank pain Because of the flank pain, will also obtain KUB to evaluate for possible nephrolithiasis as she does have a history of this that required surgical intervention in the past.  Personal review of the x-ray demonstrated no obvious stones.  Formal review by radiology also reinforced no evidence of urolithiasis.  She did have quite a bit of stool and gas however.  We discussed consideration for empiric treatment with Flomax and possibly obtaining a CT renal stone study but she wanted to hold off on this to see if perhaps the symptoms would improve with Keflex alone.  I think that this is reasonable.  She understands red flag signs and symptoms warranting further evaluation emergency department.  May need to consider reevaluation by urology at some point too if she indeed does end up having another stone. - KUB; Future - cephALEXin (KEFLEX) 500 MG capsule; Take 1 capsule (500 mg total) by mouth 4 (four) times daily for 7 days.  Dispense: 28 capsule; Refill: 0 - Urine culture  3. History of renal stone - KUB; Future  4. Accelerated hypertension I suspect blood pressure is a pain response.  She is not having any signs or symptoms concerning for hypertensive urgency or emergency.  Continue blood pressure medications.  She is to monitor blood pressures closely and seek immediate  medical attention should she start developing any red flag signs or symptoms.  Otherwise, she may follow-up in about 2 weeks for blood pressure recheck with RN.   Orders Placed This Encounter  Procedures   Urine culture   KUB    Standing Status:   Future    Number of Occurrences:   1    Standing Expiration Date:   10/08/2020    Order Specific Question:   Reason for Exam (SYMPTOM  OR DIAGNOSIS REQUIRED)    Answer:   flank pain w/ h/o renal stones.    Order Specific Question:   Preferred imaging location?    Answer:   Internal   urinalysis- dip and micro   Meds ordered this encounter  Medications   cefTRIAXone (ROCEPHIN) injection 1 g   cephALEXin (KEFLEX) 500 MG capsule    Sig: Take 1 capsule (500 mg total) by mouth 4 (four) times daily for 7 days.    Dispense:  28 capsule    Refill:  0   fluconazole (DIFLUCAN) 150 MG tablet    Sig: Take 1 tablet (150 mg total) by mouth once for 1 dose.    Dispense:  1 tablet    Refill:  [place], DO [place] [phone]

## 2019-08-10 ENCOUNTER — Telehealth: Payer: Self-pay | Admitting: Family Medicine

## 2019-08-10 LAB — URINE CULTURE: Organism ID, Bacteria: NO GROWTH

## 2019-08-10 NOTE — Telephone Encounter (Signed)
Pt states she is having a lot of pain in her lower back and abdomen. BP was 210/113 and HR was 123 at the pharmacy. Pt states her pain is 8-9 out of 10 on the pain scale. Pt denies feeling dizzy or light headed, chest pain, headache. She repeated her BP while on the phone and it was 223/123 and I advised her she should go to the ED. Pt states she thinks it is this high due to her pain. Pt voiced understanding and states she will go to the ED.

## 2019-08-22 ENCOUNTER — Other Ambulatory Visit: Payer: Self-pay | Admitting: Family Medicine

## 2019-08-22 DIAGNOSIS — G8929 Other chronic pain: Secondary | ICD-10-CM

## 2019-08-22 DIAGNOSIS — M4120 Other idiopathic scoliosis, site unspecified: Secondary | ICD-10-CM

## 2019-08-22 MED ORDER — TIZANIDINE HCL 4 MG PO TABS: 2.0000 mg | ORAL_TABLET | Freq: Three times a day (TID) | ORAL | 1 refills | Status: DC | PRN

## 2019-08-22 NOTE — Telephone Encounter (Signed)
Patient was suppsed to schedule f/u BP with RN.  Please make sure she does this.  Rx will be sent in for chronic back pain

## 2019-08-23 NOTE — Telephone Encounter (Signed)
Please see yesterday's note.  Rx already sent

## 2019-08-24 ENCOUNTER — Other Ambulatory Visit: Payer: Self-pay | Admitting: Family Medicine

## 2019-08-24 DIAGNOSIS — J42 Unspecified chronic bronchitis: Secondary | ICD-10-CM

## 2019-08-24 DIAGNOSIS — J449 Chronic obstructive pulmonary disease, unspecified: Secondary | ICD-10-CM

## 2019-09-25 ENCOUNTER — Other Ambulatory Visit: Payer: Self-pay | Admitting: Family Medicine

## 2019-09-25 DIAGNOSIS — J449 Chronic obstructive pulmonary disease, unspecified: Secondary | ICD-10-CM

## 2019-09-25 DIAGNOSIS — J42 Unspecified chronic bronchitis: Secondary | ICD-10-CM

## 2019-10-07 ENCOUNTER — Other Ambulatory Visit: Payer: Self-pay | Admitting: Family Medicine

## 2019-10-07 DIAGNOSIS — G2581 Restless legs syndrome: Secondary | ICD-10-CM

## 2019-10-30 ENCOUNTER — Other Ambulatory Visit: Payer: Self-pay

## 2019-10-31 ENCOUNTER — Ambulatory Visit: Payer: Managed Care, Other (non HMO)

## 2019-10-31 ENCOUNTER — Encounter: Payer: Self-pay | Admitting: Family Medicine

## 2019-10-31 ENCOUNTER — Ambulatory Visit (INDEPENDENT_AMBULATORY_CARE_PROVIDER_SITE_OTHER): Payer: Managed Care, Other (non HMO) | Admitting: Family Medicine

## 2019-10-31 VITALS — BP 195/99 | HR 126 | Temp 96.9°F | Ht 64.0 in | Wt 133.0 lb

## 2019-10-31 DIAGNOSIS — F411 Generalized anxiety disorder: Secondary | ICD-10-CM

## 2019-10-31 DIAGNOSIS — I479 Paroxysmal tachycardia, unspecified: Secondary | ICD-10-CM

## 2019-10-31 DIAGNOSIS — F324 Major depressive disorder, single episode, in partial remission: Secondary | ICD-10-CM | POA: Diagnosis not present

## 2019-10-31 DIAGNOSIS — F410 Panic disorder [episodic paroxysmal anxiety] without agoraphobia: Secondary | ICD-10-CM

## 2019-10-31 DIAGNOSIS — I10 Essential (primary) hypertension: Secondary | ICD-10-CM

## 2019-10-31 DIAGNOSIS — J449 Chronic obstructive pulmonary disease, unspecified: Secondary | ICD-10-CM

## 2019-10-31 DIAGNOSIS — Z79899 Other long term (current) drug therapy: Secondary | ICD-10-CM

## 2019-10-31 DIAGNOSIS — Z23 Encounter for immunization: Secondary | ICD-10-CM

## 2019-10-31 DIAGNOSIS — E559 Vitamin D deficiency, unspecified: Secondary | ICD-10-CM

## 2019-10-31 DIAGNOSIS — K2100 Gastro-esophageal reflux disease with esophagitis, without bleeding: Secondary | ICD-10-CM

## 2019-10-31 MED ORDER — LEVALBUTEROL TARTRATE 45 MCG/ACT IN AERO: 2.0000 | INHALATION_SPRAY | Freq: Three times a day (TID) | RESPIRATORY_TRACT | 12 refills | Status: DC | PRN

## 2019-10-31 MED ORDER — LORAZEPAM 0.5 MG PO TABS: 0.2500 mg | ORAL_TABLET | Freq: Two times a day (BID) | ORAL | 1 refills | Status: DC | PRN

## 2019-10-31 MED ORDER — OMEPRAZOLE 20 MG PO CPDR: 20.0000 mg | DELAYED_RELEASE_CAPSULE | Freq: Every day | ORAL | 3 refills | Status: DC

## 2019-10-31 MED ORDER — FAMOTIDINE 20 MG PO TABS: 20.0000 mg | ORAL_TABLET | Freq: Every day | ORAL | 1 refills | Status: DC

## 2019-10-31 MED ORDER — SERTRALINE HCL 100 MG PO TABS: 100.0000 mg | ORAL_TABLET | Freq: Every day | ORAL | 3 refills | Status: DC

## 2019-10-31 MED ORDER — LOSARTAN POTASSIUM 50 MG PO TABS: 50.0000 mg | ORAL_TABLET | Freq: Every day | ORAL | 3 refills | Status: DC

## 2019-10-31 MED ORDER — SPIRIVA RESPIMAT 2.5 MCG/ACT IN AERS: 1.0000 | INHALATION_SPRAY | Freq: Every day | RESPIRATORY_TRACT | 12 refills | Status: DC

## 2019-10-31 NOTE — Patient Instructions (Signed)
We discussed the risk of lorazepam and alzheimers  I have sent in a new referral to the lung specialist I have sent in Xopenex to replace albuterol.  Insurance may require prior authorization.  I will reach out the Dr Bronson Ing about the [place].  I think we should probably switch it to Metoprolol.  Also, I increased the Losartan given your elevation in blood pressure at home.  Goal is less than 140/90.

## 2019-10-31 NOTE — Addendum Note (Signed)
Addended by: Carrolyn Leigh on: 10/31/2019 05:04 PM   Modules accepted: Orders

## 2019-10-31 NOTE — Progress Notes (Signed)
Subjective: CC: Follow-up anxiety disorder PCP: Janora Norlander, DO Gabrielle Gallegos is a 64 y.o. female presenting to clinic today for:  1.  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  She still has trazodone on hand for sleep.  She has a few tablets of Ativan left over but is running low.  Last refill was several months ago.  She notes that anxiety seems to be worse with certain situations.  No SI or HI  2.  Tachycardia/hypertension Previously worked up by cardiology.  Patient has been on high-dose carvedilol 37.5 mg twice daily.  She reports compliance with this.  However, she still has intermittent episodes of tachycardia.  She does report occasional sensation of shortness of breath but denies chest pain, dizziness, loss of consciousness.  She has not followed up with him this year.  She continues to smoke.  Blood pressures at home are typically well controlled at 119/89-100.  She is compliant with losartan 25 mg daily  3.  COPD Patient reports compliance with Spiriva Respimat.  She actually does not feel that the albuterol is especially useful.  She has dyspnea on exertion with certain activities like lifting large quantities of groceries into the home or going up and down flights of stairs.  Denies any lower extremity edema.  No orthopnea.  She never establish with pulmonology because of COVID-19.  That she is interested in trying to reestablish may be with someone in [place] if this is available.  4.  GERD Patient reports compliance with omeprazole 20 mg daily.  She notes that more often than not in the morning she does feel slightly nauseated and feels to be having reflux of water-like substance into her throat.  She is wondering if there is something else she should be doing.   ROS: Per HPI  Allergies  Allergen Reactions  . Diltiazem Shortness Of  Breath    , bloated  . Metoprolol Nausea And Vomiting   Past Medical History:  Diagnosis Date  . Acute exacerbation of chronic obstructive pulmonary disease ([place])    05-27-2016 dx started on omnicef, oral prednisone  . Bladder tumor   . COPD (chronic obstructive pulmonary disease) ([place])   . Depression   . Fatigue   . GAD (generalized anxiety disorder)   . History of adenomatous polyp of colon    tubular adenoma's  10-30-2015  . History of bladder stone    01/ 2008  . History of chronic bronchitis    last bout 04-02-2016  . History of kidney stones   . History of panic attacks   . Hyperlipidemia   . Mixed stress and urge urinary incontinence   . Nephrolithiasis    bilateral nonobstructive per ct 05/ 2017  . Neuropathy   . Productive cough   . Renal cyst, left   . RLS (restless legs syndrome)   . Tachycardia 05/31/2014  . Tachycardia, paroxysmal [facility])    last cardiologist-  dr Mare Ferrari  09/ 2015  felt to stress/ anxiety related  . Urgency of urination   . Wears glasses     Current Outpatient Medications:  .  carvedilol (COREG) 25 MG tablet, Take 1.5 tablets (37.5 mg total) by mouth 2 (two) times daily with a meal., Disp: 270 tablet, Rfl: 2 .  gabapentin (NEURONTIN) 300 MG capsule, Take 1 capsule (300 mg total) by mouth 3 (three) times  daily. (Needs to be seen before next refill), Disp: 180 capsule, Rfl: 0 .  INCRUSE ELLIPTA 62.5 MCG/INH AEPB, , Disp: , Rfl:  .  LORazepam (ATIVAN) 0.5 MG tablet, Take 0.5-1 tablets (0.25-0.5 mg total) by mouth 2 (two) times daily as needed for anxiety., Disp: 20 tablet, Rfl: 0 .  losartan (COZAAR) 25 MG tablet, Take 1 tablet (25 mg total) by mouth daily., Disp: 90 tablet, Rfl: 1 .  meloxicam (MOBIC) 15 MG tablet, Take 15 mg by mouth daily., Disp: , Rfl:  .  omeprazole (PRILOSEC) 20 MG capsule, Take 1 capsule (20 mg total) by mouth daily. (Needs to be seen before next refill), Disp: 30 capsule, Rfl: 0 .  pramipexole (MIRAPEX) 0.5 MG tablet,  TAKE 1 TABLET BY MOUTH ONCE DAILY AT BEDTIME. MAY REPEAT 1 TIME IF NEEDED, Disp: 40 tablet, Rfl: 0 .  sertraline (ZOLOFT) 100 MG tablet, Take 1 tablet (100 mg total) by mouth daily., Disp: 90 tablet, Rfl: 0 .  Tiotropium Bromide Monohydrate (SPIRIVA RESPIMAT) 2.5 MCG/ACT AERS, INHALE 1 SPRAY(S) BY MOUTH ONCE DAILY . APPOINTMENT REQUIRED FOR FUTURE REFILLS, Disp: 4 g, Rfl: 0 .  tiZANidine (ZANAFLEX) 4 MG tablet, Take 0.5-1 tablets (2-4 mg total) by mouth every 8 (eight) hours as needed for muscle spasms., Disp: 90 tablet, Rfl: 1 .  traZODone (DESYREL) 100 MG tablet, TAKE 1/2 TO 1 (ONE-HALF TO ONE) TABLET BY MOUTH AT BEDTIME AS NEEDED FOR SLEEP, Disp: 90 tablet, Rfl: 0 .  VENTOLIN HFA 108 (90 Base) MCG/ACT inhaler, INHALE 2 PUFFS BY MOUTH EVERY 6 HOURS AS NEEDED FOR WHEEZING OR SHORTNESS OF BREATH . APPOINTMENT REQUIRED FOR FUTURE REFILLS, Disp: 18 g, Rfl: 0 Social History   Socioeconomic History  . Marital status: Married    Spouse name: Not on file  . Number of children: Not on file  . Years of education: Not on file  . Highest education level: Not on file  Occupational History  . Not on file  Tobacco Use  . Smoking status: Current Every Day Smoker    Packs/day: 0.10    Years: 30.00    Pack years: 3.00    Types: Cigarettes  . Smokeless tobacco: Never Used  Substance and Sexual Activity  . Alcohol use: No    Alcohol/week: 0.0 standard drinks  . Drug use: No  . Sexual activity: Not on file  Other Topics Concern  . Not on file  Social History Narrative  . Not on file   Social Determinants of Health   Financial Resource Strain:   . Difficulty of Paying Living Expenses: Not on file  Food Insecurity:   . Worried About Charity fundraiser in the Last Year: Not on file  . Ran Out of Food in the Last Year: Not on file  Transportation Needs:   . Lack of Transportation (Medical): Not on file  . Lack of Transportation (Non-Medical): Not on file  Physical Activity:   . Days of Exercise  per Week: Not on file  . Minutes of Exercise per Session: Not on file  Stress:   . Feeling of Stress : Not on file  Social Connections:   . Frequency of Communication with Friends and Family: Not on file  . Frequency of Social Gatherings with Friends and Family: Not on file  . Attends Religious Services: Not on file  . Active Member of Clubs or Organizations: Not on file  . Attends Archivist Meetings: Not on file  . Marital Status:  Not on file  Intimate Partner Violence:   . Fear of Current or Ex-Partner: Not on file  . Emotionally Abused: Not on file  . Physically Abused: Not on file  . Sexually Abused: Not on file   Family History  Problem Relation Age of Onset  . Heart disease Mother   . Cancer Father   . Lung cancer Sister   . Diabetes Brother     Objective: Office vital signs reviewed. BP (!) 195/99   Pulse (!) 126   Temp (!) 96.9 F (36.1 C) (Temporal)   Ht _0  (1.626 m)   Wt 133 lb (60.3 kg)   SpO2 94%   BMI 22.83 kg/m   Physical Examination:  General: Awake, alert, No acute distress HEENT: Normal, sclera white Cardio: sinus tachycardia, S1S2 heard, no murmurs appreciated Pulm: Globally decreased air movement.  Prolonged expiratory phase.  No wheezes, rhonchi or rales.  Normal work of breathing on room air. Extremities: warm, well perfused, No edema, cyanosis or clubbing; +2 pulses bilaterally Psych: Mood stable, speech normal, affect appropriate, pleasant and interactive.  Good eye contact. Depression screen [facility] 2/9 10/31/2019 08/09/2019 11/15/2018  Decreased Interest 0 0 0  Down, Depressed, Hopeless 0 0 0  PHQ - 2 Score 0 0 0  Altered sleeping 0 0 3  Tired, decreased energy 0 0 2  Change in appetite 0 0 0  Feeling bad or failure about yourself  0 0 0  Trouble concentrating 0 0 1  Moving slowly or fidgety/restless 0 0 0  Suicidal thoughts 0 0 0  PHQ-9 Score 0 0 6  Some recent data might be hidden   GAD 7 : Generalized Anxiety Score 10/31/2019  11/15/2018 10/04/2018  Nervous, Anxious, on Edge _1 Control/stop worrying _2 Worry too much - different things _3 Trouble relaxing _4 Restless _5 Easily annoyed or irritable _6 Afraid - awful might happen _7 Total GAD 7 Score _8 Anxiety Difficulty Somewhat difficult Somewhat difficult Very difficult   Assessment/ Plan: 64 y.o. female   1. GAD (generalized anxiety disorder) National, database was reviewed and there were no red flags.  She had a renal stone with a short course of Norco provided recently.  This is appropriate.  She has known renal stones.  Renewal of Ativan sent.  UDS and controlled substance contract was updated today.  I have also put her back on her Zoloft 100 mg daily - ToxASSURE Select 13 (MW), Urine - CMP14+EGFR - LORazepam (ATIVAN) 0.5 MG tablet; Take 0.5-1 tablets (0.25-0.5 mg total) by mouth 2 (two) times daily as needed for anxiety.  Dispense: 20 tablet; Refill: 1  2. Panic attacks - LORazepam (ATIVAN) 0.5 MG tablet; Take 0.5-1 tablets (0.25-0.5 mg total) by mouth 2 (two) times daily as needed for anxiety.  Dispense: 20 tablet; Refill: 1  3. Major depressive disorder with single episode, in partial remission ([place])  4. Controlled substance agreement signed - ToxASSURE Select 13 (MW), Urine  5. Essential hypertension Not at goal.  Increase Losartan to 20m - CMP14+EGFR - losartan (COZAAR) 50 MG tablet; Take 1 tablet (50 mg total) by mouth daily.  Dispense: 90 tablet; Refill: 3  6. Vitamin D deficiency - VITAMIN D 25 Hydroxy (Vit-D Deficiency, Fractures) - CMP14+EGFR  7. Tachycardia, paroxysmal (HCC) Tachycardic on today's exam.  I wonder if switching her to Metoprolol may be  of benefit given lung disease.  I will reach out to her cardiologist to see if he agrees with change.  Also recommended follow up with cards given elevated HR. ?need for CCB - levalbuterol (XOPENEX HFA) 45 MCG/ACT inhaler; Inhale 2 puffs into the lungs  every 8 (eight) hours as needed for wheezing or shortness of breath.  Dispense: 1 Inhaler; Refill: 12  8. Chronic obstructive pulmonary disease, unspecified COPD type ([place]) Referral back to pulm.  Change to xopenex.  Maybe this will impact her HR less. - Ambulatory referral to Pulmonology - levalbuterol [facility] HFA) 45 MCG/ACT inhaler; Inhale 2 puffs into the lungs every 8 (eight) hours as needed for wheezing or shortness of breath.  Dispense: 1 Inhaler; Refill: 12 - Tiotropium Bromide Monohydrate (SPIRIVA RESPIMAT) 2.5 MCG/ACT AERS; Inhale 1 puff into the lungs daily.  Dispense: 4 g; Refill: 12 - DG Chest 2 View; Future  9. Gastroesophageal reflux disease with esophagitis Add Pepcid at bedtime. - omeprazole (PRILOSEC) 20 MG capsule; Take 1 capsule (20 mg total) by mouth daily.  Dispense: 90 capsule; Refill: 3   Orders Placed This Encounter  Procedures  . DG Chest 2 View    Standing Status:   Future    Standing Expiration Date:   12/28/2020    Order Specific Question:   Reason for Exam (SYMPTOM  OR DIAGNOSIS REQUIRED)    Answer:   COPD, DOE    Order Specific Question:   Preferred imaging location?    Answer:   Internal    Order Specific Question:   Radiology Contrast Protocol - do NOT remove file path    Answer:   [email or web address]  . VITAMIN D 25 Hydroxy (Vit-D Deficiency, Fractures)  . ToxASSURE Select 13 (MW), Urine  . CMP14+EGFR  . Ambulatory referral to Pulmonology    Referral Priority:   Routine    Referral Type:   Consultation    Referral Reason:   Specialty Services Required    Requested Specialty:   Pulmonary Disease    Number of Visits Requested:   1   Meds ordered this encounter  Medications  . LORazepam (ATIVAN) 0.5 MG tablet    Sig: Take 0.5-1 tablets (0.25-0.5 mg total) by mouth 2 (two) times daily as needed for anxiety.    Dispense:  20 tablet    Refill:  1  . famotidine (PEPCID) 20 MG tablet    Sig: Take 1 tablet (20  mg total) by mouth at bedtime.    Dispense:  90 tablet    Refill:  1  . levalbuterol (XOPENEX HFA) 45 MCG/ACT inhaler    Sig: Inhale 2 puffs into the lungs every 8 (eight) hours as needed for wheezing or shortness of breath.    Dispense:  1 Inhaler    Refill:  12  . losartan (COZAAR) 50 MG tablet    Sig: Take 1 tablet (50 mg total) by mouth daily.    Dispense:  90 tablet    Refill:  3  . omeprazole (PRILOSEC) 20 MG capsule    Sig: Take 1 capsule (20 mg total) by mouth daily.    Dispense:  90 capsule    Refill:  3  . sertraline (ZOLOFT) 100 MG tablet    Sig: Take 1 tablet (100 mg total) by mouth daily.    Dispense:  90 tablet    Refill:  3  . Tiotropium Bromide Monohydrate (SPIRIVA RESPIMAT) 2.5 MCG/ACT AERS    Sig: Inhale 1 puff into the lungs daily.  Dispense:  4 g    Refill:  Gabrielle Gallegos, Gabrielle Gallegos [phone]

## 2019-11-01 LAB — CMP14+EGFR
ALT: 8 IU/L (ref 0–32)
AST: 9 IU/L (ref 0–40)
Albumin/Globulin Ratio: 1.1 — ABNORMAL LOW (ref 1.2–2.2)
Albumin: 4 g/dL (ref 3.8–4.8)
Alkaline Phosphatase: 114 IU/L (ref 39–117)
BUN/Creatinine Ratio: 15 (ref 12–28)
BUN: 11 mg/dL (ref 8–27)
Bilirubin Total: <0.2 mg/dL (ref 0.0–1.2)
CO2: 25 mmol/L (ref 20–29)
Calcium: 9.7 mg/dL (ref 8.7–10.3)
Chloride: 99 mmol/L (ref 96–106)
Creatinine, Ser: 0.74 mg/dL (ref 0.57–1.00)
GFR calc Af Amer: 100 mL/min/{1.73_m2} (ref >59)
GFR calc non Af Amer: 87 mL/min/{1.73_m2} (ref >59)
Globulin, Total: 3.5 g/dL (ref 1.5–4.5)
Glucose: 107 mg/dL — ABNORMAL HIGH (ref 65–99)
Potassium: 4.1 mmol/L (ref 3.5–5.2)
Sodium: 140 mmol/L (ref 134–144)
Total Protein: 7.5 g/dL (ref 6.0–8.5)

## 2019-11-01 LAB — VITAMIN D 25 HYDROXY (VIT D DEFICIENCY, FRACTURES): Vit D, 25-Hydroxy: 17.5 ng/mL — ABNORMAL LOW (ref 30.0–100.0)

## 2019-11-02 ENCOUNTER — Other Ambulatory Visit: Payer: Self-pay | Admitting: Family Medicine

## 2019-11-02 ENCOUNTER — Telehealth: Payer: Self-pay | Admitting: Family Medicine

## 2019-11-02 DIAGNOSIS — G2581 Restless legs syndrome: Secondary | ICD-10-CM

## 2019-11-02 LAB — TOXASSURE SELECT 13 (MW), URINE

## 2019-11-02 MED ORDER — CHOLECALCIFEROL 1.25 MG (50000 UT) PO CAPS: 50000.0000 [IU] | ORAL_CAPSULE | ORAL | 0 refills | Status: AC

## 2019-11-02 NOTE — Telephone Encounter (Signed)
Pt called stating that she spoke with [facility] about her Rx for Xopenex and was told that it requires prior auth.

## 2019-11-02 NOTE — Telephone Encounter (Signed)
Spoke to the pharmacy and they are faxing the PA request over.

## 2019-11-02 NOTE — Telephone Encounter (Signed)
Prior Auth for Xopenex Inh-APPROVED till 11/01/20  Key: [id number] -   PA Case ID: [id number]   Pharmacy notified and pt aware.

## 2019-11-03 ENCOUNTER — Telehealth: Payer: Self-pay | Admitting: *Deleted

## 2019-11-03 MED ORDER — PRAMIPEXOLE DIHYDROCHLORIDE 0.5 MG PO TABS: ORAL_TABLET | ORAL | 3 refills | Status: DC

## 2019-11-03 NOTE — Telephone Encounter (Signed)
PA approved 11/02/2019.

## 2019-12-22 ENCOUNTER — Other Ambulatory Visit: Payer: Self-pay | Admitting: Family Medicine

## 2019-12-22 DIAGNOSIS — F324 Major depressive disorder, single episode, in partial remission: Secondary | ICD-10-CM

## 2019-12-22 DIAGNOSIS — F410 Panic disorder [episodic paroxysmal anxiety] without agoraphobia: Secondary | ICD-10-CM

## 2019-12-22 DIAGNOSIS — F411 Generalized anxiety disorder: Secondary | ICD-10-CM

## 2020-01-01 ENCOUNTER — Other Ambulatory Visit: Payer: Self-pay | Admitting: *Deleted

## 2020-01-01 DIAGNOSIS — J42 Unspecified chronic bronchitis: Secondary | ICD-10-CM

## 2020-01-11 ENCOUNTER — Other Ambulatory Visit: Payer: Self-pay | Admitting: Family Medicine

## 2020-01-11 ENCOUNTER — Encounter: Payer: Self-pay | Admitting: Family Medicine

## 2020-01-11 DIAGNOSIS — F324 Major depressive disorder, single episode, in partial remission: Secondary | ICD-10-CM

## 2020-01-11 DIAGNOSIS — F411 Generalized anxiety disorder: Secondary | ICD-10-CM

## 2020-02-06 ENCOUNTER — Other Ambulatory Visit: Payer: Self-pay | Admitting: Family Medicine

## 2020-03-05 ENCOUNTER — Ambulatory Visit (INDEPENDENT_AMBULATORY_CARE_PROVIDER_SITE_OTHER): Payer: Managed Care, Other (non HMO) | Admitting: Family Medicine

## 2020-03-05 ENCOUNTER — Other Ambulatory Visit: Payer: Self-pay

## 2020-03-05 ENCOUNTER — Encounter: Payer: Self-pay | Admitting: Family Medicine

## 2020-03-05 VITALS — BP 180/102 | HR 114 | Temp 97.0°F | Ht 64.0 in | Wt 136.0 lb

## 2020-03-05 DIAGNOSIS — R1013 Epigastric pain: Secondary | ICD-10-CM

## 2020-03-05 DIAGNOSIS — R1011 Right upper quadrant pain: Secondary | ICD-10-CM

## 2020-03-05 DIAGNOSIS — J42 Unspecified chronic bronchitis: Secondary | ICD-10-CM | POA: Diagnosis not present

## 2020-03-05 NOTE — Progress Notes (Signed)
Subjective: CC: Follow-up anxiety disorder PCP: Janora Norlander, DO [facility]:CWCBJ AVAMAE DEHAAN is a 64 y.o. female presenting to clinic today for:  1.  Cough/ Abdominal pain Patient reports several month history of cough that has gradually gotten worse and is present every morning now.  She coughs so hard such that she gags and produces quite a bit of foamy white mucus.  She thinks this is coming from her stomach but cannot be sure.  She does report some epigastric pain despite use of PPI each morning and Pepcid each evening.  She has been faithful in taking these.  Denies any change in appetite but does report early satiety that seems to be new.  She denies any constipation, diarrhea, hematochezia, melena.  No night sweats or unplanned weight loss.  She never establish care with the pulmonologist because she did not receive correspondence of the appointment; however she does still want to see them.  She was seeing a specialist in [place] for her colonoscopy.  ROS: Per HPI  Allergies  Allergen Reactions  . Diltiazem Shortness Of Breath    , bloated  . Metoprolol Nausea And Vomiting   Past Medical History:  Diagnosis Date  . Acute exacerbation of chronic obstructive pulmonary disease ([place])    05-27-2016 dx started on omnicef, oral prednisone  . Bladder tumor   . COPD (chronic obstructive pulmonary disease) ([place])   . Depression   . Fatigue   . GAD (generalized anxiety disorder)   . History of adenomatous polyp of colon    tubular adenoma's  10-30-2015  . History of bladder stone    01/ 2008  . History of chronic bronchitis    last bout 04-02-2016  . History of kidney stones   . History of panic attacks   . Hyperlipidemia   . Mixed stress and urge urinary incontinence   . Nephrolithiasis    bilateral nonobstructive per ct 05/ 2017  . Neuropathy   . Productive cough   . Renal cyst, left   . RLS (restless legs syndrome)   . Tachycardia 05/31/2014  . Tachycardia, paroxysmal  [facility])    last cardiologist-  dr Mare Ferrari  09/ 2015  felt to stress/ anxiety related  . Urgency of urination   . Wears glasses     Current Outpatient Medications:  .  carvedilol (COREG) 25 MG tablet, Take 1.5 tablets (37.5 mg total) by mouth 2 (two) times daily with a meal., Disp: 270 tablet, Rfl: 2 .  famotidine (PEPCID) 20 MG tablet, TAKE 1 TABLET BY MOUTH AT BEDTIME, Disp: 90 tablet, Rfl: 0 .  gabapentin (NEURONTIN) 300 MG capsule, Take 1 capsule (300 mg total) by mouth 3 (three) times daily. (Needs to be seen before next refill), Disp: 180 capsule, Rfl: 0 .  levalbuterol (XOPENEX HFA) 45 MCG/ACT inhaler, Inhale 2 puffs into the lungs every 8 (eight) hours as needed for wheezing or shortness of breath., Disp: 1 Inhaler, Rfl: 12 .  LORazepam (ATIVAN) 0.5 MG tablet, Take 0.5-1 tablets (0.25-0.5 mg total) by mouth 2 (two) times daily as needed for anxiety., Disp: 20 tablet, Rfl: 1 .  losartan (COZAAR) 50 MG tablet, Take 1 tablet (50 mg total) by mouth daily., Disp: 90 tablet, Rfl: 3 .  meloxicam (MOBIC) 15 MG tablet, Take 15 mg by mouth daily., Disp: , Rfl:  .  omeprazole (PRILOSEC) 20 MG capsule, Take 1 capsule (20 mg total) by mouth daily., Disp: 90 capsule, Rfl: 3 .  pramipexole (MIRAPEX) 0.5 MG  tablet, TAKE 1 TABLET BY MOUTH ONCE DAILY AT BEDTIME. MAY REPEAT 1 TIME IF NEEDED, Disp: 40 tablet, Rfl: 3 .  sertraline (ZOLOFT) 100 MG tablet, Take 1 tablet (100 mg total) by mouth daily., Disp: 90 tablet, Rfl: 3 .  Tiotropium Bromide Monohydrate (SPIRIVA RESPIMAT) 2.5 MCG/ACT AERS, Inhale 1 puff into the lungs daily., Disp: 4 g, Rfl: 12 .  traZODone (DESYREL) 100 MG tablet, TAKE 1/2 TO 1 (ONE-HALF TO ONE) TABLET BY MOUTH ONCE DAILY AT BEDTIME AS NEEDED FOR SLEEP, Disp: 90 tablet, Rfl: 0 Social History   Socioeconomic History  . Marital status: Married    Spouse name: Not on file  . Number of children: Not on file  . Years of education: Not on file  . Highest education level: Not on file    Occupational History  . Not on file  Tobacco Use  . Smoking status: Current Every Day Smoker    Packs/day: 0.10    Years: 30.00    Pack years: 3.00    Types: Cigarettes  . Smokeless tobacco: Never Used  Vaping Use  . Vaping Use: Never used  Substance and Sexual Activity  . Alcohol use: No    Alcohol/week: 0.0 standard drinks  . Drug use: No  . Sexual activity: Not on file  Other Topics Concern  . Not on file  Social History Narrative  . Not on file   Social Determinants of Health   Financial Resource Strain:   . Difficulty of Paying Living Expenses:   Food Insecurity:   . Worried About Charity fundraiser in the Last Year:   . Arboriculturist in the Last Year:   Transportation Needs:   . Film/video editor (Medical):   Marland Kitchen Lack of Transportation (Non-Medical):   Physical Activity:   . Days of Exercise per Week:   . Minutes of Exercise per Session:   Stress:   . Feeling of Stress :   Social Connections:   . Frequency of Communication with Friends and Family:   . Frequency of Social Gatherings with Friends and Family:   . Attends Religious Services:   . Active Member of Clubs or Organizations:   . Attends Archivist Meetings:   Marland Kitchen Marital Status:   Intimate Partner Violence:   . Fear of Current or Ex-Partner:   . Emotionally Abused:   Marland Kitchen Physically Abused:   . Sexually Abused:    Family History  Problem Relation Age of Onset  . Heart disease Mother   . Cancer Father   . Lung cancer Sister   . Diabetes Brother     Objective: Office vital signs reviewed. BP (!) 180/102 Comment: manual  Pulse (!) 114   Temp (!) 97 F (36.1 C) (Temporal)   Ht 5\' 4"  (1.626 m)   Wt 136 lb (61.7 kg)   SpO2 100%   BMI 23.34 kg/m   Physical Examination:  General: Awake, alert, No acute distress HEENT: Normal, sclera white Cardio: sinus tachycardia, S1S2 heard, no murmurs appreciated Pulm: Globally decreased air movement.  Prolonged expiratory phase.  No  wheezes, rhonchi or rales.  Normal work of breathing on room air. No coughing. GI: +epigastric TTP. +RUQ TTP. No guarding or rebound Extremities: warm, well perfused, No edema, cyanosis or clubbing; +2 pulses bilaterally Psych: Mood stable, speech normal, affect appropriate, pleasant and interactive.  Good eye contact. Depression screen [facility] 2/9 03/05/2020 10/31/2019 08/09/2019  Decreased Interest 0 0 0  Down, Depressed, Hopeless  0 0 0  PHQ - 2 Score 0 0 0  Altered sleeping 0 0 0  Tired, decreased energy 0 0 0  Change in appetite 0 0 0  Feeling bad or failure about yourself  0 0 0  Trouble concentrating 0 0 0  Moving slowly or fidgety/restless 0 0 0  Suicidal thoughts 0 0 0  PHQ-9 Score 0 0 0  Some recent data might be hidden    Assessment/ Plan: 64 y.o. female   1. Chronic bronchitis, unspecified chronic bronchitis type ([place]) I have reinforced need to establish care with pulmonology.  I am unable to determine whether or not the foamy substance she is producing is from her GI tract or from her lungs.  She certainly may have 2 things going on at once.  She is to continue her inhalers as prescribed.  I have given her the information for the pulmonologist with whom she was supposed to establish in [place].  I will see if Loma Sousa can rearrange this appointment and have updated the phone number on file.  2. Epigastric pain Question possible peptic ulcer/duodenal ulcer given area of tenderness.  She certainly does not fit a typical cholecystitis picture.  I doubt mesenteric ischemia given the lack of increased abdominal pain with food intake but she certainly has elevated blood pressures here today.  We discussed need for blood pressure monitoring.  May need to consider adding Norvasc if persistently elevated outside of pain - Ambulatory referral to Gastroenterology  3. RUQ pain Has gallbladder but again not quite fitting the picture for cholecystitis.  I reviewed her June 2012 abdominal  ultrasound which did show mild fatty infiltration of the liver.  Perhaps this is progressed.  We discussed consideration for repeat right upper quadrant ultrasound and she would be amenable to this if we cannot get her into gastroenterology in a timely manner.  I have put an urgent referral into her gastroenterologist as she has lapsed in colonoscopy as well.  Of course amongst the differential diagnosis would be malignancy given early satiety. - Ambulatory referral to Gastroenterology   No orders of the defined types were placed in this encounter.  No orders of the defined types were placed in this encounter.    Janora Norlander, DO [place] [phone]

## 2020-03-06 ENCOUNTER — Other Ambulatory Visit: Payer: Self-pay

## 2020-03-06 DIAGNOSIS — I479 Paroxysmal tachycardia, unspecified: Secondary | ICD-10-CM

## 2020-03-06 DIAGNOSIS — J449 Chronic obstructive pulmonary disease, unspecified: Secondary | ICD-10-CM

## 2020-03-06 MED ORDER — LEVALBUTEROL TARTRATE 45 MCG/ACT IN AERO: 2.0000 | INHALATION_SPRAY | Freq: Three times a day (TID) | RESPIRATORY_TRACT | 2 refills | Status: DC | PRN

## 2020-03-08 ENCOUNTER — Other Ambulatory Visit: Payer: Self-pay | Admitting: Family Medicine

## 2020-03-15 ENCOUNTER — Encounter: Payer: Self-pay | Admitting: Physician Assistant

## 2020-03-18 ENCOUNTER — Other Ambulatory Visit: Payer: Self-pay | Admitting: Family Medicine

## 2020-04-01 ENCOUNTER — Other Ambulatory Visit: Payer: Self-pay | Admitting: Family Medicine

## 2020-04-01 DIAGNOSIS — F411 Generalized anxiety disorder: Secondary | ICD-10-CM

## 2020-04-01 DIAGNOSIS — G2581 Restless legs syndrome: Secondary | ICD-10-CM

## 2020-04-01 DIAGNOSIS — F324 Major depressive disorder, single episode, in partial remission: Secondary | ICD-10-CM

## 2020-04-08 ENCOUNTER — Other Ambulatory Visit: Payer: Self-pay | Admitting: Family Medicine

## 2020-04-08 DIAGNOSIS — G2581 Restless legs syndrome: Secondary | ICD-10-CM

## 2020-04-08 DIAGNOSIS — J42 Unspecified chronic bronchitis: Secondary | ICD-10-CM

## 2020-04-08 NOTE — Addendum Note (Signed)
Addended by: Antonietta Barcelona D on: 04/08/2020 12:50 PM   Modules accepted: Orders

## 2020-04-15 ENCOUNTER — Ambulatory Visit: Payer: Self-pay | Admitting: Physician Assistant

## 2020-04-16 ENCOUNTER — Telehealth: Payer: Self-pay | Admitting: *Deleted

## 2020-04-16 NOTE — Telephone Encounter (Signed)
Call in to pharmacy, they are not seeing this in their system Sent fax back with note that Albuterol discontinued on 10/31/19 d/t tachycardia changed to Xopenex

## 2020-04-16 NOTE — Telephone Encounter (Signed)
Pt placed on Xopenex due to tachycardia

## 2020-04-16 NOTE — Telephone Encounter (Signed)
Fax from [facility] Re: Xopenex HFA inh 15 gm Your pt may have option to fill the preferred drug and could save pt up to $683 a yr Preferred: Albuterol HFA inhaler 8.5 gm 90 mcg

## 2020-04-24 ENCOUNTER — Ambulatory Visit (INDEPENDENT_AMBULATORY_CARE_PROVIDER_SITE_OTHER): Payer: Managed Care, Other (non HMO) | Admitting: Family Medicine

## 2020-04-24 ENCOUNTER — Other Ambulatory Visit: Payer: Self-pay

## 2020-04-24 ENCOUNTER — Encounter: Payer: Self-pay | Admitting: Family Medicine

## 2020-04-24 VITALS — BP 207/119 | HR 128 | Temp 97.9°F | Ht 63.0 in | Wt 138.0 lb

## 2020-04-24 DIAGNOSIS — I10 Essential (primary) hypertension: Secondary | ICD-10-CM

## 2020-04-24 DIAGNOSIS — F324 Major depressive disorder, single episode, in partial remission: Secondary | ICD-10-CM

## 2020-04-24 DIAGNOSIS — R5383 Other fatigue: Secondary | ICD-10-CM | POA: Diagnosis not present

## 2020-04-24 DIAGNOSIS — R000 Tachycardia, unspecified: Secondary | ICD-10-CM

## 2020-04-24 DIAGNOSIS — F410 Panic disorder [episodic paroxysmal anxiety] without agoraphobia: Secondary | ICD-10-CM

## 2020-04-24 DIAGNOSIS — F411 Generalized anxiety disorder: Secondary | ICD-10-CM | POA: Diagnosis not present

## 2020-04-24 DIAGNOSIS — E559 Vitamin D deficiency, unspecified: Secondary | ICD-10-CM | POA: Diagnosis not present

## 2020-04-24 DIAGNOSIS — G2581 Restless legs syndrome: Secondary | ICD-10-CM

## 2020-04-24 DIAGNOSIS — M62838 Other muscle spasm: Secondary | ICD-10-CM

## 2020-04-24 MED ORDER — GABAPENTIN 300 MG PO CAPS: 300.0000 mg | ORAL_CAPSULE | Freq: Three times a day (TID) | ORAL | 3 refills | Status: DC

## 2020-04-24 MED ORDER — FAMOTIDINE 20 MG PO TABS: 20.0000 mg | ORAL_TABLET | Freq: Every day | ORAL | 3 refills | Status: DC

## 2020-04-24 MED ORDER — PRAMIPEXOLE DIHYDROCHLORIDE 0.5 MG PO TABS: ORAL_TABLET | ORAL | 3 refills | Status: DC

## 2020-04-24 MED ORDER — DIAZEPAM 2 MG PO TABS: 1.0000 mg | ORAL_TABLET | Freq: Every day | ORAL | 1 refills | Status: DC | PRN

## 2020-04-24 MED ORDER — TRAZODONE HCL 100 MG PO TABS: ORAL_TABLET | ORAL | 3 refills | Status: DC

## 2020-04-24 MED ORDER — CARVEDILOL 25 MG PO TABS: 37.5000 mg | ORAL_TABLET | Freq: Two times a day (BID) | ORAL | 3 refills | Status: DC

## 2020-04-24 NOTE — Progress Notes (Signed)
Subjective: CC: med refills PCP: Janora Norlander, DO Gabrielle Gallegos is a 64 y.o. female presenting to clinic today for:  1. GAD/ panic/ tachycardia Patient is compliant with Zoloft daily.  She has been feeling a little bit more anxious but notes that she is run out of her Coreg as of 1 week ago.  She is also been out of her trazodone.  She has been having tachycardia.  Her heart rate has been in the 120s at home.  No edema.  Shortness of breath is well controlled with new Breztri.   2.  Cramping She has been having cramping/muscle spasm of the left forearm and right posterior calf.  This is been ongoing for many months but seems to be worse as of late.  Sometimes the cramping will get so bad it withdrawal her left hand in.  She reports adequate hydration.  She is not on a diuretic.  3.  Hypertension Patient reports blood pressures over the last week have been running systolics of 034V over 42V diastolic.  She typically runs much lower than this but she ran out of her Coreg as above.  She is compliant with losartan 50 mg daily.  ROS: Per HPI  Allergies  Allergen Reactions  . Diltiazem Shortness Of Breath    , bloated  . Metoprolol Nausea And Vomiting   Past Medical History:  Diagnosis Date  . Acute exacerbation of chronic obstructive pulmonary disease ([place])    05-27-2016 dx started on omnicef, oral prednisone  . Bladder tumor   . COPD (chronic obstructive pulmonary disease) ([place])   . Depression   . Fatigue   . GAD (generalized anxiety disorder)   . History of adenomatous polyp of colon    tubular adenoma's  10-30-2015  . History of bladder stone    01/ 2008  . History of chronic bronchitis    last bout 04-02-2016  . History of kidney stones   . History of panic attacks   . Hyperlipidemia   . Mixed stress and urge urinary incontinence   . Nephrolithiasis    bilateral nonobstructive per ct 05/ 2017  . Neuropathy   . Productive cough   . Renal cyst, left   .  RLS (restless legs syndrome)   . Tachycardia 05/31/2014  . Tachycardia, paroxysmal [facility])    last cardiologist-  dr Mare Ferrari  09/ 2015  felt to stress/ anxiety related  . Urgency of urination   . Wears glasses     Current Outpatient Medications:  .  Budeson-Glycopyrrol-Formoterol (BREZTRI AEROSPHERE) 160-9-4.8 MCG/ACT AERO, Inhale into the lungs., Disp: , Rfl:  .  carvedilol (COREG) 25 MG tablet, Take 1.5 tablets (37.5 mg total) by mouth 2 (two) times daily with a meal., Disp: 270 tablet, Rfl: 2 .  famotidine (PEPCID) 20 MG tablet, TAKE 1 TABLET BY MOUTH AT BEDTIME, Disp: 90 tablet, Rfl: 0 .  gabapentin (NEURONTIN) 300 MG capsule, Take 1 capsule (300 mg total) by mouth 3 (three) times daily., Disp: 270 capsule, Rfl: 0 .  levalbuterol (XOPENEX HFA) 45 MCG/ACT inhaler, Inhale 2 puffs into the lungs every 8 (eight) hours as needed for wheezing or shortness of breath., Disp: 1 Inhaler, Rfl: 2 .  losartan (COZAAR) 50 MG tablet, Take 1 tablet (50 mg total) by mouth daily., Disp: 90 tablet, Rfl: 3 .  meloxicam (MOBIC) 15 MG tablet, Take 15 mg by mouth daily., Disp: , Rfl:  .  nicotine (NICODERM CQ - DOSED IN MG/24 HOURS) 21 mg/24hr  patch, Place onto the skin., Disp: , Rfl:  .  omeprazole (PRILOSEC) 20 MG capsule, Take 1 capsule (20 mg total) by mouth daily., Disp: 90 capsule, Rfl: 3 .  pramipexole (MIRAPEX) 0.5 MG tablet, TAKE 1 TABLET BY MOUTH ONCE DAILY AT BEDTIME. MAY REPEAT 1 TIME IF NEEDED, Disp: 40 tablet, Rfl: 3 .  sertraline (ZOLOFT) 100 MG tablet, Take 1 tablet (100 mg total) by mouth daily., Disp: 90 tablet, Rfl: 3 .  Tiotropium Bromide Monohydrate (SPIRIVA RESPIMAT) 2.5 MCG/ACT AERS, Inhale 1 puff into the lungs daily., Disp: 4 g, Rfl: 12 .  traZODone (DESYREL) 100 MG tablet, TAKE 1/2 TO 1 (ONE-HALF TO ONE) TABLET BY MOUTH ONCE DAILY AT BEDTIME AS NEEDED FOR SLEEP, Disp: 90 tablet, Rfl: 0 Social History   Socioeconomic History  . Marital status: Married    Spouse name: Not on file  .  Number of children: Not on file  . Years of education: Not on file  . Highest education level: Not on file  Occupational History  . Not on file  Tobacco Use  . Smoking status: Current Every Day Smoker    Packs/day: 0.10    Years: 30.00    Pack years: 3.00    Types: Cigarettes  . Smokeless tobacco: Never Used  Vaping Use  . Vaping Use: Never used  Substance and Sexual Activity  . Alcohol use: No    Alcohol/week: 0.0 standard drinks  . Drug use: No  . Sexual activity: Not on file  Other Topics Concern  . Not on file  Social History Narrative  . Not on file   Social Determinants of Health   Financial Resource Strain:   . Difficulty of Paying Living Expenses:   Food Insecurity:   . Worried About Charity fundraiser in the Last Year:   . Arboriculturist in the Last Year:   Transportation Needs:   . Film/video editor (Medical):   Marland Kitchen Lack of Transportation (Non-Medical):   Physical Activity:   . Days of Exercise per Week:   . Minutes of Exercise per Session:   Stress:   . Feeling of Stress :   Social Connections:   . Frequency of Communication with Friends and Family:   . Frequency of Social Gatherings with Friends and Family:   . Attends Religious Services:   . Active Member of Clubs or Organizations:   . Attends Archivist Meetings:   Marland Kitchen Marital Status:   Intimate Partner Violence:   . Fear of Current or Ex-Partner:   . Emotionally Abused:   Marland Kitchen Physically Abused:   . Sexually Abused:    Family History  Problem Relation Age of Onset  . Heart disease Mother   . Cancer Father   . Lung cancer Sister   . Diabetes Brother     Objective: Office vital signs reviewed. BP (!) 202/120   Pulse (!) 128   Temp 97.9 F (36.6 C) (Temporal)   Ht '5\' 3"'  (1.6 m)   Wt 138 lb (62.6 kg)   SpO2 96%   BMI 24.45 kg/m   Physical Examination:  General: Awake, alert, No acute distress HEENT: Normal, sclera white Cardio: tachycardic. S1S2 heard, no murmurs  appreciated Pulm: clear to auscultation bilaterally, no wheezes, rhonchi or rales; normal work of breathing on room air Extremities: warm, well perfused, No edema, cyanosis or clubbing; +2 pulses bilaterally MSK: normal gait and station Psych: Mood stable Depression screen [facility] 2/9 04/24/2020 03/05/2020 10/31/2019  Decreased Interest 0 0 0  Down, Depressed, Hopeless 0 0 0  PHQ - 2 Score 0 0 0  Altered sleeping 0 0 0  Tired, decreased energy 0 0 0  Change in appetite 0 0 0  Feeling bad or failure about yourself  0 0 0  Trouble concentrating 0 0 0  Moving slowly or fidgety/restless 0 0 0  Suicidal thoughts 0 0 0  PHQ-9 Score 0 0 0  Some recent data might be hidden   GAD 7 : Generalized Anxiety Score 04/24/2020 10/31/2019 11/15/2018 10/04/2018  Nervous, Anxious, on Edge '1 3 2 3  ' Control/stop worrying '2 1 3 3  ' Worry too much - different things '2 1 3 3  ' Trouble relaxing '1 1 3 2  ' Restless '1 1 2 1  ' Easily annoyed or irritable '1 1 3 3  ' Afraid - awful might happen '1 1 2 3  ' Total GAD 7 Score '9 9 18 18  ' Anxiety Difficulty Somewhat difficult Somewhat difficult Somewhat difficult Very difficult    Assessment/ Plan: 64 y.o. female   1. Fatigue, unspecified type I suspect secondary to tachycardia has been uncontrolled for the last week.  I am going to try and treat her outpatient and have recommended she immediately go back on her beta-blocker.  This has been sent to her pharmacy.  We will check for other metabolic reasons for fatigue but again suspect this is secondary to tachycardia.  She certainly needs to follow-up with her cardiologist soon as well - CMP14+EGFR - TSH - Vitamin B12 - CBC  2. Vitamin D deficiency - VITAMIN D 25 Hydroxy (Vit-D Deficiency, Fractures)  3. GAD (generalized anxiety disorder) Stable.  I have given her Valium to help with both the panic attacks and the muscle spasm.  We discussed sparing use an infrequent use. - diazepam (VALIUM) 2 MG tablet; Take 0.5-1 tablets  (1-2 mg total) by mouth daily as needed for muscle spasms (or panic attack).  Dispense: 30 tablet; Refill: 1 - traZODone (DESYREL) 100 MG tablet; TAKE 1/2 TO 1 (ONE-HALF TO ONE) TABLET BY MOUTH ONCE DAILY AT BEDTIME AS NEEDED FOR SLEEP  Dispense: 90 tablet; Refill: 3  4. Panic attacks The Narcotic Database has been reviewed.  There were no red flags.   - diazepam (VALIUM) 2 MG tablet; Take 0.5-1 tablets (1-2 mg total) by mouth daily as needed for muscle spasms (or panic attack).  Dispense: 30 tablet; Refill: 1  5. White coat syndrome with diagnosis of hypertension Her blood pressures at home are typically well controlled but as of late they have been slightly more elevated.  Suspect again this is due to lapse in beta-blocker.  I am going to have her bring in her blood pressure cuff in 2 weeks to have a blood pressure check with the nurse.  May need to consider advancing her blood pressure medication versus adding medication.  6. Major depressive disorder with single episode, in partial remission (HCC) - traZODone (DESYREL) 100 MG tablet; TAKE 1/2 TO 1 (ONE-HALF TO ONE) TABLET BY MOUTH ONCE DAILY AT BEDTIME AS NEEDED FOR SLEEP  Dispense: 90 tablet; Refill: 3  7. Restless leg syndrome - pramipexole (MIRAPEX) 0.5 MG tablet; TAKE 1 TABLET BY MOUTH ONCE DAILY AT BEDTIME. MAY REPEAT 1 TIME IF NEEDED  Dispense: 40 tablet; Refill: 3  8. Muscle spasm Check for electrolyte deficiencies as above - diazepam (VALIUM) 2 MG tablet; Take 0.5-1 tablets (1-2 mg total) by mouth daily as needed for muscle spasms (  or panic attack).  Dispense: 30 tablet; Refill: 1  9. Inappropriate sinus tachycardia - Mg - carvedilol (COREG) 25 MG tablet; Take 1.5 tablets (37.5 mg total) by mouth 2 (two) times daily with a meal.  Dispense: 270 tablet; Refill: 3   No orders of the defined types were placed in this encounter.  No orders of the defined types were placed in this encounter.    Janora Norlander, DO [place] [phone]

## 2020-04-24 NOTE — Patient Instructions (Signed)
You had labs performed today.  You will be contacted with the results of the labs once they are available, usually in the next 3 business days for routine lab work.  If you have an active my chart account, they will be released to your MyChart.  If you prefer to have these labs released to you via telephone, please let us know.  If you had a pap smear or biopsy performed, expect to be contacted in about 7-10 days.   Muscle Cramps and Spasms Muscle cramps and spasms are when muscles tighten by themselves. They usually get better within minutes. Muscle cramps are painful. They are usually stronger and last longer than muscle spasms. Muscle spasms may or may not be painful. They can last a few seconds or much longer. Cramps and spasms can affect any muscle, but they occur most often in the calf muscles of the leg. They are usually not caused by a serious problem. In many cases, the cause is not known. Some common causes include:  Doing more physical work or exercise than your body is ready for.  Using the muscles too much (overuse) by repeating certain movements too many times.  Staying in a certain position for a long time.  Playing a sport or doing an activity without preparing properly.  Using bad form or technique while playing a sport or doing an activity.  Not having enough water in your body (dehydration).  Injury.  Side effects of some medicines.  Low levels of the salts and minerals in your blood (electrolytes), such as low potassium or calcium. Follow these instructions at home: Managing pain and stiffness      Massage, stretch, and relax the muscle. Do this for many minutes at a time.  If told, put heat on tight or tense muscles as often as told by your doctor. Use the heat source that your doctor recommends, such as a moist heat pack or a heating pad. ? Place a towel between your skin and the heat source. ? Leave the heat on for 20-30 minutes. ? Remove the heat if your  skin turns bright red. This is very important if you are not able to feel pain, heat, or cold. You may have a greater risk of getting burned.  If told, put ice on the affected area. This may help if you are sore or have pain after a cramp or spasm. ? Put ice in a plastic bag. ? Place a towel between your skin and the bag. ? Leave the ice on for 20 minutes, 2-3 times a day.  Try taking hot showers or baths to help relax tight muscles. Eating and drinking  Drink enough fluid to keep your pee (urine) pale yellow.  Eat a healthy diet to help ensure that your muscles work well. This should include: ? Fruits and vegetables. ? Lean protein. ? Whole grains. ? Low-fat or nonfat dairy products. General instructions  If you are having cramps often, avoid intense exercise for several days.  Take over-the-counter and prescription medicines only as told by your doctor.  Watch for any changes in your symptoms.  Keep all follow-up visits as told by your doctor. This is important. Contact a doctor if:  Your cramps or spasms get worse or happen more often.  Your cramps or spasms do not get better with time. Summary  Muscle cramps and spasms are when muscles tighten by themselves. They usually get better within minutes.  Cramps and spasms occur most often in  the calf muscles of the leg.  Massage, stretch, and relax the muscle. This may help the cramp or spasm go away.  Drink enough fluid to keep your pee (urine) pale yellow. This information is not intended to replace advice given to you by your health care provider. Make sure you discuss any questions you have with your health care provider. Document Revised: 01/17/2018 Document Reviewed: 01/17/2018 Elsevier Patient Education  [employer].

## 2020-04-25 ENCOUNTER — Other Ambulatory Visit: Payer: Self-pay | Admitting: Family Medicine

## 2020-04-25 ENCOUNTER — Encounter: Payer: Self-pay | Admitting: Family Medicine

## 2020-04-25 LAB — CBC
Hematocrit: 49.3 % — ABNORMAL HIGH (ref 34.0–46.6)
Hemoglobin: 16.5 g/dL — ABNORMAL HIGH (ref 11.1–15.9)
MCH: 28.4 pg (ref 26.6–33.0)
MCHC: 33.5 g/dL (ref 31.5–35.7)
MCV: 85 fL (ref 79–97)
Platelets: 272 10*3/uL (ref 150–450)
RBC: 5.82 x10E6/uL — ABNORMAL HIGH (ref 3.77–5.28)
RDW: 14.3 % (ref 11.7–15.4)
WBC: 10.4 10*3/uL (ref 3.4–10.8)

## 2020-04-25 LAB — CMP14+EGFR
ALT: 13 IU/L (ref 0–32)
AST: 16 IU/L (ref 0–40)
Albumin/Globulin Ratio: 1.2 (ref 1.2–2.2)
Albumin: 4.3 g/dL (ref 3.8–4.8)
Alkaline Phosphatase: 114 IU/L (ref 48–121)
BUN/Creatinine Ratio: 20 (ref 12–28)
BUN: 15 mg/dL (ref 8–27)
Bilirubin Total: <0.2 mg/dL (ref 0.0–1.2)
CO2: 25 mmol/L (ref 20–29)
Calcium: 9.5 mg/dL (ref 8.7–10.3)
Chloride: 96 mmol/L (ref 96–106)
Creatinine, Ser: 0.76 mg/dL (ref 0.57–1.00)
GFR calc Af Amer: 97 mL/min/{1.73_m2} (ref >59)
GFR calc non Af Amer: 84 mL/min/{1.73_m2} (ref >59)
Globulin, Total: 3.6 g/dL (ref 1.5–4.5)
Glucose: 115 mg/dL — ABNORMAL HIGH (ref 65–99)
Potassium: 4 mmol/L (ref 3.5–5.2)
Sodium: 137 mmol/L (ref 134–144)
Total Protein: 7.9 g/dL (ref 6.0–8.5)

## 2020-04-25 LAB — TSH: TSH: 0.588 u[IU]/mL (ref 0.450–4.500)

## 2020-04-25 LAB — MAGNESIUM: Magnesium: 2 mg/dL (ref 1.6–2.3)

## 2020-04-25 LAB — VITAMIN B12: Vitamin B-12: 350 pg/mL (ref 232–1245)

## 2020-04-25 LAB — VITAMIN D 25 HYDROXY (VIT D DEFICIENCY, FRACTURES): Vit D, 25-Hydroxy: 22.9 ng/mL — ABNORMAL LOW (ref 30.0–100.0)

## 2020-04-25 MED ORDER — TIZANIDINE HCL 4 MG PO TABS
4.0000 mg | ORAL_TABLET | Freq: Three times a day (TID) | ORAL | 0 refills | Status: DC | PRN
Start: 2020-04-25 — End: 2020-07-08

## 2020-04-30 ENCOUNTER — Other Ambulatory Visit: Payer: Self-pay | Admitting: Family Medicine

## 2020-04-30 MED ORDER — VITAMIN D (ERGOCALCIFEROL) 1.25 MG (50000 UNIT) PO CAPS
50000.0000 [IU] | ORAL_CAPSULE | ORAL | 0 refills | Status: DC
Start: 2020-04-30 — End: 2021-05-07

## 2020-06-03 ENCOUNTER — Other Ambulatory Visit: Payer: Self-pay | Admitting: *Deleted

## 2020-06-03 DIAGNOSIS — J42 Unspecified chronic bronchitis: Secondary | ICD-10-CM

## 2020-06-03 NOTE — Telephone Encounter (Signed)
Fax from [place] med request from xopenex [facility] inhaler 15 gm Change to Albuterol HFA inh 8.5 gm 90 mcg can save pt $683.16 Per 10/31/19 OV notes, pt did not feel Albuterol was useful, will deny with comment

## 2020-07-06 ENCOUNTER — Other Ambulatory Visit: Payer: Self-pay | Admitting: Family Medicine

## 2020-07-06 DIAGNOSIS — M62838 Other muscle spasm: Secondary | ICD-10-CM

## 2020-07-06 DIAGNOSIS — F410 Panic disorder [episodic paroxysmal anxiety] without agoraphobia: Secondary | ICD-10-CM

## 2020-07-06 DIAGNOSIS — F411 Generalized anxiety disorder: Secondary | ICD-10-CM

## 2020-07-17 ENCOUNTER — Other Ambulatory Visit: Payer: Self-pay | Admitting: Family Medicine

## 2020-07-17 DIAGNOSIS — F411 Generalized anxiety disorder: Secondary | ICD-10-CM

## 2020-07-17 DIAGNOSIS — M62838 Other muscle spasm: Secondary | ICD-10-CM

## 2020-07-17 DIAGNOSIS — F410 Panic disorder [episodic paroxysmal anxiety] without agoraphobia: Secondary | ICD-10-CM

## 2020-07-23 ENCOUNTER — Telehealth: Payer: Self-pay

## 2020-07-23 NOTE — Telephone Encounter (Signed)
Pt aware - 12/8 appt given

## 2020-07-23 NOTE — Telephone Encounter (Signed)
Received message via My Chart with pt requesting to schedule an appt with Dr Lajuana Ripple ASAP for med refill on controlled Rx. Dr Lajuana Ripple does not have an open appt until 09/10/2020.  Please advise.  (I did add pt to Dr Alver Sorrow cancellation list)

## 2020-07-31 ENCOUNTER — Ambulatory Visit: Payer: Managed Care, Other (non HMO) | Admitting: Family Medicine

## 2020-08-14 ENCOUNTER — Ambulatory Visit: Payer: Managed Care, Other (non HMO) | Admitting: Family Medicine

## 2020-08-22 ENCOUNTER — Other Ambulatory Visit: Payer: Self-pay | Admitting: *Deleted

## 2020-08-22 ENCOUNTER — Telehealth: Payer: Self-pay

## 2020-08-22 DIAGNOSIS — R300 Dysuria: Secondary | ICD-10-CM

## 2020-08-22 NOTE — Telephone Encounter (Signed)
Pt had a positive urine for kidney infection/stones yesterday. She says had a CT scan last month and is was shown there but patient was not hurting then. She started having pain yesterday. She is an active lung cancer patient.  Her oncologist will not rx her an abx and was told to call her pcp. Use [facility] pharmacy. Please call back

## 2020-08-22 NOTE — Telephone Encounter (Signed)
TELE VISIT SCHEDULED FOR TOMORROW, PATIENT TO DROP OFF URINE TOMORROW

## 2020-08-23 ENCOUNTER — Ambulatory Visit: Payer: Managed Care, Other (non HMO)

## 2020-08-23 ENCOUNTER — Ambulatory Visit (INDEPENDENT_AMBULATORY_CARE_PROVIDER_SITE_OTHER): Payer: Managed Care, Other (non HMO) | Admitting: Nurse Practitioner

## 2020-08-23 DIAGNOSIS — R109 Unspecified abdominal pain: Secondary | ICD-10-CM | POA: Diagnosis not present

## 2020-08-23 NOTE — Progress Notes (Signed)
   Virtual Visit via telephone Note Due to COVID-19 pandemic this visit was conducted virtually. This visit type was conducted due to national recommendations for restrictions regarding the COVID-19 Pandemic (e.g. social distancing, sheltering in place) in an effort to limit this patient's exposure and mitigate transmission in our community. All issues noted in this document were discussed and addressed.  A physical exam was not performed with this format.  I connected with Gabrielle Gallegos on 08/23/20 at 1:20 by telephone and verified that I am speaking with the correct person using two identifiers. Gabrielle Gallegos is currently located at home and no one is currently with her during visit. The provider, Mary-Margaret Hassell Done, FNP is located in their office at time of visit.  I discussed the limitations, risks, security and privacy concerns of performing an evaluation and management service by telephone and the availability of in person appointments. I also discussed with the patient that there may be a patient responsible charge related to this service. The patient expressed understanding and agreed to proceed.   History and Present Illness:   Chief Complaint: Urinary Tract Infection   HPI Patient says she is having pain in left flank area. She has had kidney stone sin the past and feels like a kidney stone. She had cancer center check her urine while she was there and it had blood in it. They told her did not look like an infection. She rates pain 5/10 currrently. She know that she had a kidney stone in her left kidney when they did her last scan.    Review of Systems  Constitutional: Negative for diaphoresis and weight loss.  Eyes: Negative for blurred vision, double vision and pain.  Respiratory: Negative for shortness of breath.   Cardiovascular: Negative for chest pain, palpitations, orthopnea and leg swelling.  Gastrointestinal: Negative for abdominal pain.  Genitourinary: Positive  for flank pain and hematuria. Negative for dysuria and urgency.  Skin: Negative for rash.  Neurological: Negative for dizziness, sensory change, loss of consciousness, weakness and headaches.  Endo/Heme/Allergies: Negative for polydipsia. Does not bruise/bleed easily.  Psychiatric/Behavioral: Negative for memory loss. The patient does not have insomnia.   All other systems reviewed and are negative.    Observations/Objective: Alert and oriented- answers all questions appropriately No distress    Assessment and Plan: Gabrielle Gallegos in today with chief complaint of Urinary Tract Infection   1. Left flank pain Force fluids Stat referral to urology - Ambulatory referral to Urology    Follow Up Instructions: prn    I discussed the assessment and treatment plan with the patient. The patient was provided an opportunity to ask questions and all were answered. The patient agreed with the plan and demonstrated an understanding of the instructions.   The patient was advised to call back or seek an in-person evaluation if the symptoms worsen or if the condition fails to improve as anticipated.  The above assessment and management plan was discussed with the patient. The patient verbalized understanding of and has agreed to the management plan. Patient is aware to call the clinic if symptoms persist or worsen. Patient is aware when to return to the clinic for a follow-up visit. Patient educated on when it is appropriate to go to the emergency department.   Time call ended:  1:38  I provided 13 minutes of non-face-to-face time during this encounter.    Mary-Margaret Hassell Done, FNP

## 2020-09-07 ENCOUNTER — Other Ambulatory Visit: Payer: Self-pay | Admitting: Family Medicine

## 2020-09-11 ENCOUNTER — Other Ambulatory Visit: Payer: Self-pay

## 2020-09-11 ENCOUNTER — Ambulatory Visit (INDEPENDENT_AMBULATORY_CARE_PROVIDER_SITE_OTHER): Payer: Managed Care, Other (non HMO) | Admitting: Family Medicine

## 2020-09-11 VITALS — BP 138/80 | HR 124 | Temp 97.4°F | Ht 63.0 in | Wt 144.0 lb

## 2020-09-11 DIAGNOSIS — F411 Generalized anxiety disorder: Secondary | ICD-10-CM | POA: Diagnosis not present

## 2020-09-11 DIAGNOSIS — F410 Panic disorder [episodic paroxysmal anxiety] without agoraphobia: Secondary | ICD-10-CM | POA: Diagnosis not present

## 2020-09-11 DIAGNOSIS — I479 Paroxysmal tachycardia, unspecified: Secondary | ICD-10-CM

## 2020-09-11 DIAGNOSIS — C3412 Malignant neoplasm of upper lobe, left bronchus or lung: Secondary | ICD-10-CM

## 2020-09-11 DIAGNOSIS — I10 Essential (primary) hypertension: Secondary | ICD-10-CM

## 2020-09-11 MED ORDER — BUSPIRONE HCL 5 MG PO TABS: ORAL_TABLET | ORAL | 0 refills | Status: AC

## 2020-09-11 NOTE — Progress Notes (Signed)
Subjective: CC: Follow-up anxiety, lung cancer PCP: Janora Norlander, DO Gabrielle Gallegos is a 65 y.o. female presenting to clinic today for:  1.  Anxiety disorder with panic/lung cancer Patient reports intermittent episodes of panic attacks.  She was previously treated with Valium 2 mg daily but unfortunate was lost to follow-up.  She had this prescribed by an outside provider, which unfortunately was a violation of her controlled substance contract.  She is compliant with Zoloft 100 mg daily.  She reports feeling tremulous sometimes.  She is now done with her radiation and chemotherapy for her lung cancer and has follow-up imaging soon to ensure that it has responded as expected.  2.  Tachycardia Patient with paroxysmal tachycardia.  This has been previously evaluated by cardiology the patient's cardiologist is no longer with [facility].  She would like to reestablish with a new provider in [place] if possible.  She continues to take her beta-blocker, Coreg 37.5 mg twice daily.   ROS: Per HPI  Allergies  Allergen Reactions  . Diltiazem Shortness Of Breath    , bloated  . Metoprolol Nausea And Vomiting   Past Medical History:  Diagnosis Date  . Acute exacerbation of chronic obstructive pulmonary disease ([place])    05-27-2016 dx started on omnicef, oral prednisone  . Bladder tumor   . COPD (chronic obstructive pulmonary disease) ([place])   . Depression   . Fatigue   . GAD (generalized anxiety disorder)   . History of adenomatous polyp of colon    tubular adenoma's  10-30-2015  . History of bladder stone    01/ 2008  . History of chronic bronchitis    last bout 04-02-2016  . History of kidney stones   . History of panic attacks   . Hyperlipidemia   . Mixed stress and urge urinary incontinence   . Nephrolithiasis    bilateral nonobstructive per ct 05/ 2017  . Neuropathy   . Productive cough   . Renal cyst, left   . RLS (restless legs syndrome)   . Tachycardia  05/31/2014  . Tachycardia, paroxysmal [facility])    last cardiologist-  dr Mare Ferrari  09/ 2015  felt to stress/ anxiety related  . Urgency of urination   . Wears glasses     Current Outpatient Medications:  .  Budeson-Glycopyrrol-Formoterol (BREZTRI AEROSPHERE) 160-9-4.8 MCG/ACT AERO, Inhale into the lungs., Disp: , Rfl:  .  carvedilol (COREG) 25 MG tablet, Take 1.5 tablets (37.5 mg total) by mouth 2 (two) times daily with a meal., Disp: 270 tablet, Rfl: 3 .  diazepam (VALIUM) 2 MG tablet, Take 0.5-1 tablets (1-2 mg total) by mouth daily as needed for muscle spasms (or panic attack)., Disp: 30 tablet, Rfl: 1 .  famotidine (PEPCID) 20 MG tablet, Take 1 tablet (20 mg total) by mouth at bedtime., Disp: 90 tablet, Rfl: 3 .  gabapentin (NEURONTIN) 300 MG capsule, Take 1 capsule (300 mg total) by mouth 3 (three) times daily., Disp: 270 capsule, Rfl: 3 .  levalbuterol (XOPENEX HFA) 45 MCG/ACT inhaler, Inhale 2 puffs into the lungs every 8 (eight) hours as needed for wheezing or shortness of breath., Disp: 1 Inhaler, Rfl: 2 .  losartan (COZAAR) 50 MG tablet, Take 1 tablet (50 mg total) by mouth daily., Disp: 90 tablet, Rfl: 3 .  meloxicam (MOBIC) 15 MG tablet, Take 15 mg by mouth daily., Disp: , Rfl:  .  nicotine (NICODERM CQ - DOSED IN MG/24 HOURS) 21 mg/24hr patch, Place onto the skin., Disp: ,  Rfl:  .  omeprazole (PRILOSEC) 20 MG capsule, Take 1 capsule (20 mg total) by mouth daily., Disp: 90 capsule, Rfl: 3 .  pramipexole (MIRAPEX) 0.5 MG tablet, TAKE 1 TABLET BY MOUTH ONCE DAILY AT BEDTIME. MAY REPEAT 1 TIME IF NEEDED, Disp: 40 tablet, Rfl: 3 .  sertraline (ZOLOFT) 100 MG tablet, Take 1 tablet (100 mg total) by mouth daily., Disp: 90 tablet, Rfl: 3 .  Tiotropium Bromide Monohydrate (SPIRIVA RESPIMAT) 2.5 MCG/ACT AERS, Inhale 1 puff into the lungs daily., Disp: 4 g, Rfl: 12 .  tiZANidine (ZANAFLEX) 4 MG tablet, TAKE 1 TABLET BY MOUTH EVERY 8 HOURS AS NEEDED FOR MUSCLE SPASM, Disp: 90 tablet, Rfl: 0 .   traZODone (DESYREL) 100 MG tablet, TAKE 1/2 TO 1 (ONE-HALF TO ONE) TABLET BY MOUTH ONCE DAILY AT BEDTIME AS NEEDED FOR SLEEP, Disp: 90 tablet, Rfl: 3 .  Vitamin D, Ergocalciferol, (DRISDOL) 1.25 MG (50000 UNIT) CAPS capsule, Take 1 capsule (50,000 Units total) by mouth every 7 (seven) days. x12 weeks then switch to OTC VIt D 800 IU / day, Disp: 12 capsule, Rfl: 0 Social History   Socioeconomic History  . Marital status: Married    Spouse name: Not on file  . Number of children: Not on file  . Years of education: Not on file  . Highest education level: Not on file  Occupational History  . Not on file  Tobacco Use  . Smoking status: Current Every Day Smoker    Packs/day: 0.10    Years: 30.00    Pack years: 3.00    Types: Cigarettes  . Smokeless tobacco: Never Used  Vaping Use  . Vaping Use: Never used  Substance and Sexual Activity  . Alcohol use: No    Alcohol/week: 0.0 standard drinks  . Drug use: No  . Sexual activity: Not on file  Other Topics Concern  . Not on file  Social History Narrative  . Not on file   Social Determinants of Health   Financial Resource Strain: Not on file  Food Insecurity: Not on file  Transportation Needs: Not on file  Physical Activity: Not on file  Stress: Not on file  Social Connections: Not on file  Intimate Partner Violence: Not on file   Family History  Problem Relation Age of Onset  . Heart disease Mother   . Cancer Father   . Lung cancer Sister   . Diabetes Brother     Objective: Office vital signs reviewed. BP 138/80   Pulse (!) 124   Temp (!) 97.4 F (36.3 C) (Temporal)   Ht 5\' 3"  (1.6 m)   Wt 144 lb (65.3 kg)   SpO2 94%   BMI 25.51 kg/m   Physical Examination:  General: Awake, alert, well nourished, No acute distress HEENT: Normal; no exophthalmos.  No goiter Cardio: Sinus tachycardia noted.  No murmurs. Pulm: clear to auscultation bilaterally, no wheezes, rhonchi or rales; normal work of breathing on room  air Psych: Patient is pleasant, interactive.  Her mood is stable.  She does appear somewhat anxious intermittently  Assessment/ Plan: 65 y.o. female   Essential hypertension  Tachycardia, paroxysmal ([place]) - Plan: Ambulatory referral to Cardiology  GAD (generalized anxiety disorder) - Plan: busPIRone (BUSPAR) 5 MG tablet  Panic attacks - Plan: busPIRone (BUSPAR) 5 MG tablet  Malignant neoplasm of upper lobe of left lung (HCC)  Blood pressures well controlled.  Pulse is elevated but she has known paroxysmal tachycardia and is on a beta-blocker for this.  I have placed in a referral to cardiology so that she may establish care in [place] since her old cardiologist is no longer practicing with [facility]  With regards to her panic attacks, unfortunately I did explain that my hands are tied when it comes to controlled substances at this point because of an outside provider prescribing not only Valium but Norco as well.  I do think that we can get a hold of her anxiety without this however.  I have started her on buspirone and instructed her how to titrate this appropriately.  I will follow up with her again in 4 weeks via telephone visit.  We can increase further if needed at that point.  She will continue Zoloft at current dose.  No orders of the defined types were placed in this encounter.  No orders of the defined types were placed in this encounter.    Janora Norlander, DO [place] [phone]

## 2020-09-11 NOTE — Patient Instructions (Addendum)
Unfortunately, I cannot prescribe controlled medications anymore. We ARE going to try and get that anxiety under control without them!  Buspar has been added.  I want you to gradually increased as directed on bottle EACH WEEK IF ANXIETY IS STILL BAD.  Otherwise, stop at the dose that works for you and let me know.    I'm going to chat with julie our pharmacist about how we can get Xopenex at reduced cost.  Give me a couple of days to get that in order.

## 2020-09-17 ENCOUNTER — Telehealth: Payer: Self-pay

## 2020-09-17 ENCOUNTER — Other Ambulatory Visit: Payer: Self-pay | Admitting: Family Medicine

## 2020-09-17 DIAGNOSIS — I10 Essential (primary) hypertension: Secondary | ICD-10-CM

## 2020-09-17 DIAGNOSIS — G2581 Restless legs syndrome: Secondary | ICD-10-CM

## 2020-09-17 DIAGNOSIS — K2100 Gastro-esophageal reflux disease with esophagitis, without bleeding: Secondary | ICD-10-CM

## 2020-09-17 NOTE — Telephone Encounter (Signed)
Request received electronically & sent this morning

## 2020-09-18 ENCOUNTER — Telehealth: Payer: Self-pay

## 2020-09-18 NOTE — Telephone Encounter (Signed)
Pt wants to know if we have any samples of Trelegy? Says its going to take the pharmacy a few days to fill her trelegy and emergency inhaler and she is completely out of both.  Please check for samples and call patient.

## 2020-09-18 NOTE — Telephone Encounter (Signed)
Yes

## 2020-09-18 NOTE — Telephone Encounter (Signed)
Trellegy has not been filled by Dr. Darnell Level.  Please advise if this is okay.

## 2020-09-19 NOTE — Telephone Encounter (Signed)
Samples up front for patient to pick up. Left detailed message.

## 2020-10-21 ENCOUNTER — Ambulatory Visit (INDEPENDENT_AMBULATORY_CARE_PROVIDER_SITE_OTHER): Payer: Managed Care, Other (non HMO) | Admitting: Family Medicine

## 2020-10-21 ENCOUNTER — Encounter: Payer: Self-pay | Admitting: Family Medicine

## 2020-10-21 DIAGNOSIS — F411 Generalized anxiety disorder: Secondary | ICD-10-CM

## 2020-10-21 DIAGNOSIS — F4329 Adjustment disorder with other symptoms: Secondary | ICD-10-CM

## 2020-10-21 DIAGNOSIS — F410 Panic disorder [episodic paroxysmal anxiety] without agoraphobia: Secondary | ICD-10-CM

## 2020-10-21 MED ORDER — PRAZOSIN HCL 1 MG PO CAPS: 1.0000 mg | ORAL_CAPSULE | Freq: Every day | ORAL | 99 refills | Status: DC

## 2020-10-21 MED ORDER — BUSPIRONE HCL 15 MG PO TABS: 15.0000 mg | ORAL_TABLET | Freq: Two times a day (BID) | ORAL | 99 refills | Status: DC
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
## 2020-10-22 ENCOUNTER — Other Ambulatory Visit: Payer: Self-pay | Admitting: Family Medicine

## 2020-10-22 DIAGNOSIS — G2581 Restless legs syndrome: Secondary | ICD-10-CM

## 2020-10-23 ENCOUNTER — Telehealth: Payer: Self-pay | Admitting: Licensed Clinical Social Worker

## 2020-10-23 NOTE — Telephone Encounter (Signed)
Mailbox full unable to accept messages, will try again

## 2020-10-30 ENCOUNTER — Telehealth: Payer: Self-pay | Admitting: Licensed Clinical Social Worker

## 2020-10-30 NOTE — Telephone Encounter (Signed)
Left generic message requesting call back.  

## 2020-11-06 ENCOUNTER — Other Ambulatory Visit: Payer: Self-pay | Admitting: Family Medicine

## 2020-11-06 DIAGNOSIS — K2100 Gastro-esophageal reflux disease with esophagitis, without bleeding: Secondary | ICD-10-CM

## 2020-11-11 ENCOUNTER — Other Ambulatory Visit: Payer: Self-pay | Admitting: Family Medicine

## 2020-11-11 DIAGNOSIS — I479 Paroxysmal tachycardia, unspecified: Secondary | ICD-10-CM

## 2020-11-11 DIAGNOSIS — J449 Chronic obstructive pulmonary disease, unspecified: Secondary | ICD-10-CM

## 2020-11-12 ENCOUNTER — Other Ambulatory Visit: Payer: Self-pay | Admitting: Family Medicine

## 2020-11-12 DIAGNOSIS — I479 Paroxysmal tachycardia, unspecified: Secondary | ICD-10-CM

## 2020-11-12 DIAGNOSIS — J449 Chronic obstructive pulmonary disease, unspecified: Secondary | ICD-10-CM

## 2020-11-13 ENCOUNTER — Other Ambulatory Visit: Payer: Self-pay | Admitting: Family Medicine

## 2020-11-13 DIAGNOSIS — K2100 Gastro-esophageal reflux disease with esophagitis, without bleeding: Secondary | ICD-10-CM
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
## 2020-11-27 ENCOUNTER — Other Ambulatory Visit: Payer: Self-pay | Admitting: *Deleted

## 2020-11-27 MED ORDER — TRELEGY ELLIPTA 100-62.5-25 MCG/INH IN AEPB: 1.0000 | INHALATION_SPRAY | Freq: Every day | RESPIRATORY_TRACT | 12 refills | Status: DC

## 2020-12-11 ENCOUNTER — Other Ambulatory Visit: Payer: Self-pay | Admitting: Family Medicine

## 2020-12-11 DIAGNOSIS — G2581 Restless legs syndrome: Secondary | ICD-10-CM

## 2020-12-11 DIAGNOSIS — I10 Essential (primary) hypertension: Secondary | ICD-10-CM

## 2020-12-26 NOTE — Progress Notes (Deleted)
Referring-Gabrielle Lajuana Ripple, DO Reason for referral-palpitations  HPI: 65 year old female for evaluation of palpitations at request of Ronnie Doss, DO.  Previously seen by Dr. Bronson Ing most recently in March 2020.  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  Echocardiogram at [facility] September 2021 showed normal LV function and small pericardial effusion.  Current Outpatient Medications  Medication Sig Dispense Refill  . albuterol (VENTOLIN HFA) 108 (90 Base) MCG/ACT inhaler Inhale 2 puffs into the lungs every 6 (six) hours as needed for wheezing or shortness of breath (Please alert to change in med (ins will NOT cover Xopenex)). 8 g 0  . busPIRone (BUSPAR) 15 MG tablet Take 1 tablet (15 mg total) by mouth 2 (two) times daily. 60 tablet prn  . carvedilol (COREG) 25 MG tablet Take 1.5 tablets (37.5 mg total) by mouth 2 (two) times daily with a meal. 270 tablet 3  . famotidine (PEPCID) 20 MG tablet Take 1 tablet (20 mg total) by mouth at bedtime. 90 tablet 3  . Fluticasone-Umeclidin-Vilant (TRELEGY ELLIPTA) 100-62.5-25 MCG/INH AEPB Inhale 1 puff into the lungs daily. 60 each 12  . gabapentin (NEURONTIN) 300 MG capsule Take 1 capsule (300 mg total) by mouth 3 (three) times daily. 270 capsule 3  . losartan (COZAAR) 50 MG tablet Take 1 tablet (50 mg total) by mouth daily. (NEEDS TO BE SEEN BEFORE NEXT REFILL) 30 tablet 0  . meloxicam (MOBIC) 15 MG tablet Take 15 mg by mouth daily.    . nicotine (NICODERM CQ - DOSED IN MG/24 HOURS) 21 mg/24hr patch Place onto the skin.    . Omega-3 Fatty Acids (FISH OIL) 1000 MG CAPS Take by mouth.    Marland Kitchen omeprazole (PRILOSEC) 20 MG capsule Take 1 capsule by mouth once daily 90 capsule 0  . ondansetron (ZOFRAN) 4 MG tablet Take by mouth.    . pramipexole (MIRAPEX) 0.5 MG tablet TAKE 1 TABLET BY MOUTH AT BEDTIME. MAY REPEAT ONCE DAILY IF NEEDED  (NEEDS TO BE SEEN BEFORE NEXT REFILL) 40 tablet 0  .  prazosin (MINIPRESS) 1 MG capsule Take 1-2 capsules (1-2 mg total) by mouth at bedtime. For sleep/ nightmares 60 capsule prn  . sertraline (ZOLOFT) 100 MG tablet Take 1 tablet by mouth once daily 90 tablet 0  . tiZANidine (ZANAFLEX) 4 MG tablet TAKE 1 TABLET BY MOUTH EVERY 8 HOURS AS NEEDED FOR MUSCLE SPASM 90 tablet prn  . Vitamin D, Ergocalciferol, (DRISDOL) 1.25 MG (50000 UNIT) CAPS capsule Take 1 capsule (50,000 Units total) by mouth every 7 (seven) days. x12 weeks then switch to OTC VIt D 800 IU / day 12 capsule 0   No current facility-administered medications for this visit.    Allergies  Allergen Reactions  . Diltiazem Shortness Of Breath    , bloated  . Metoprolol Nausea And Vomiting    Past Medical History:  Diagnosis Date  . Acute exacerbation of chronic obstructive pulmonary disease ([place])    05-27-2016 dx started on omnicef, oral prednisone  . Bladder tumor   . COPD (chronic obstructive pulmonary disease) ([place])   . Depression   . Fatigue   . GAD (generalized anxiety disorder)   . History of adenomatous polyp of colon    tubular adenoma's  10-30-2015  . History of bladder stone    01/ 2008  . History of chronic bronchitis    last bout 04-02-2016  . History of kidney stones   . History of panic  attacks   . Hyperlipidemia   . Mixed stress and urge urinary incontinence   . Nephrolithiasis    bilateral nonobstructive per ct 05/ 2017  . Neuropathy   . Productive cough   . Renal cyst, left   . RLS (restless legs syndrome)   . Tachycardia 05/31/2014  . Tachycardia, paroxysmal [facility])    last cardiologist-  dr Mare Ferrari  09/ 2015  felt to stress/ anxiety related  . Urgency of urination   . Wears glasses     Past Surgical History:  Procedure Laterality Date  . ABDOMINAL HYSTERECTOMY  1983  . CARPAL TUNNEL RELEASE Left 1992  . EXCISION GANGLION RIGHT LITTLE FINGER  03/23/2001  . EXTRACORPOREAL SHOCK WAVE LITHOTRIPSY  2015  approx  . KNEE ARTHROSCOPY Left 1994  .  KNEE SURGERY Left 1976  . ORIF GREAT TOE  Left 2007  approx  . TRANSTHORACIC ECHOCARDIOGRAM  05/23/2014   mild LVH,  ef 60-65%  . TRANSURETHRAL RESECTION OF BLADDER TUMOR WITH MITOMYCIN-C N/A 06/01/2016   Procedure: TRANSURETHRAL RESECTION OF BLADDER TUMOR WITH MITOMYCIN-C IN PACU POST OP;  Surgeon: Kathie Rhodes, MD;  Location: [place];  Service: Urology;  Laterality: N/A;  . ULNAR NERVE REPAIR Left 1994    Social History   Socioeconomic History  . Marital status: Married    Spouse name: Not on file  . Number of children: Not on file  . Years of education: Not on file  . Highest education level: Not on file  Occupational History  . Not on file  Tobacco Use  . Smoking status: Current Every Day Smoker    Packs/day: 0.10    Years: 30.00    Pack years: 3.00    Types: Cigarettes  . Smokeless tobacco: Never Used  Vaping Use  . Vaping Use: Never used  Substance and Sexual Activity  . Alcohol use: No    Alcohol/week: 0.0 standard drinks  . Drug use: No  . Sexual activity: Not on file  Other Topics Concern  . Not on file  Social History Narrative  . Not on file   Social Determinants of Health   Financial Resource Strain: Not on file  Food Insecurity: Not on file  Transportation Needs: Not on file  Physical Activity: Not on file  Stress: Not on file  Social Connections: Not on file  Intimate Partner Violence: Not on file    Family History  Problem Relation Age of Onset  . Heart disease Mother   . Cancer Father   . Lung cancer Sister   . Diabetes Brother     ROS: no fevers or chills, productive cough, hemoptysis, dysphasia, odynophagia, melena, hematochezia, dysuria, hematuria, rash, seizure activity, orthopnea, PND, pedal edema, claudication. Remaining systems are negative.  Physical Exam:   There were no vitals taken for this visit.  General:  Well developed/well nourished in NAD Skin warm/dry Patient not depressed No peripheral  clubbing Back-normal HEENT-normal/normal eyelids Neck supple/normal carotid upstroke bilaterally; no bruits; no JVD; no thyromegaly chest - CTA/ normal expansion CV - RRR/normal S1 and S2; no murmurs, rubs or gallops;  PMI nondisplaced Abdomen -NT/ND, no HSM, no mass, + bowel sounds, no bruit 2+ femoral pulses, no bruits Ext-no edema, chords, 2+ DP Neuro-grossly nonfocal  ECG - personally reviewed  A/P  1 palpitations-  2 hypertension-  3 history of exertional dyspnea-  4 hyperlipidemia-  Kirk Ruths, MD

## 2021-01-01 ENCOUNTER — Ambulatory Visit: Payer: Managed Care, Other (non HMO) | Admitting: Cardiology

## 2021-01-03 ENCOUNTER — Other Ambulatory Visit: Payer: Self-pay | Admitting: Family Medicine

## 2021-01-03 DIAGNOSIS — J449 Chronic obstructive pulmonary disease, unspecified: Secondary | ICD-10-CM

## 2021-01-03 DIAGNOSIS — I479 Paroxysmal tachycardia, unspecified: Secondary | ICD-10-CM

## 2021-01-08 ENCOUNTER — Other Ambulatory Visit: Payer: Self-pay

## 2021-01-08 ENCOUNTER — Encounter: Payer: Self-pay | Admitting: Nurse Practitioner

## 2021-01-08 ENCOUNTER — Ambulatory Visit (INDEPENDENT_AMBULATORY_CARE_PROVIDER_SITE_OTHER): Payer: Managed Care, Other (non HMO) | Admitting: Nurse Practitioner

## 2021-01-08 VITALS — BP 136/90 | HR 122 | Temp 97.4°F | Ht 63.0 in | Wt 134.0 lb

## 2021-01-08 DIAGNOSIS — N12 Tubulo-interstitial nephritis, not specified as acute or chronic: Secondary | ICD-10-CM

## 2021-01-08 DIAGNOSIS — R300 Dysuria: Secondary | ICD-10-CM

## 2021-01-08 LAB — URINALYSIS, ROUTINE W REFLEX MICROSCOPIC
Bilirubin, UA: NEGATIVE
Glucose, UA: NEGATIVE
Nitrite, UA: NEGATIVE
Specific Gravity, UA: >1.03 — ABNORMAL HIGH (ref 1.005–1.030)
Urobilinogen, Ur: 0.2 mg/dL (ref 0.2–1.0)
pH, UA: 5 (ref 5.0–7.5)

## 2021-01-08 LAB — MICROSCOPIC EXAMINATION: RBC, Urine: NONE SEEN /hpf (ref 0–2)

## 2021-01-08 MED ORDER — SULFAMETHOXAZOLE-TRIMETHOPRIM 800-160 MG PO TABS: 1.0000 | ORAL_TABLET | Freq: Two times a day (BID) | ORAL | 0 refills | Status: DC

## 2021-01-08 MED ORDER — TRAMADOL HCL 50 MG PO TABS: 50.0000 mg | ORAL_TABLET | Freq: Three times a day (TID) | ORAL | 0 refills | Status: AC | PRN

## 2021-01-08 NOTE — Patient Instructions (Signed)
Dysuria Dysuria is pain or discomfort during urination. The pain or discomfort may be felt in the part of the body that drains urine from the bladder (urethra) or in the surrounding tissue of the genitals. The pain may also be felt in the groin area, lower abdomen, or lower back. You may have to urinate frequently or have the sudden feeling that you have to urinate (urgency). Dysuria can affect anyone, but it is more common in females. Dysuria can be caused by many different things, including:  Urinary tract infection.  Kidney stones or bladder stones.  Certain STIs (sexually transmitted infections), such as chlamydia.  Dehydration.  Inflammation of the tissues of the vagina.  Use of certain medicines.  Use of certain soaps or scented products that cause irritation. Follow these instructions at home: Medicines  Take over-the-counter and prescription medicines only as told by your health care provider.  If you were prescribed an antibiotic medicine, take it as told by your health care provider. Do not stop taking the antibiotic even if you start to feel better. Eating and drinking  Drink enough fluid to keep your urine pale yellow.  Avoid caffeinated beverages, tea, and alcohol. These beverages can irritate the bladder and make dysuria worse. In males, alcohol may irritate the prostate.   General instructions  Watch your condition for any changes.  Urinate often. Avoid holding urine for long periods of time.  If you are female, you should wipe from front to back after urinating or having a bowel movement. Use each piece of toilet paper only once.  Empty your bladder after sex.  Keep all follow-up visits. This is important.  If you had any tests done to find the cause of dysuria, it is up to you to get your test results. Ask your health care provider, or the department that is doing the test, when your results will be ready. Contact a health care provider if:  You have a  fever.  You develop pain in your back or sides.  You have nausea or vomiting.  You have blood in your urine.  You are not urinating as often as you usually do. Get help right away if:  Your pain is severe and not relieved with medicines.  You cannot eat or drink without vomiting.  You are confused.  You have a rapid heartbeat while resting.  You have shaking or chills.  You feel extremely weak. Summary  Dysuria is pain or discomfort while urinating. Many different conditions can lead to dysuria.  If you have dysuria, you may have to urinate frequently or have the sudden feeling that you have to urinate (urgency).  Watch your condition for any changes. Keep all follow-up visits.  Make sure that you urinate often and drink enough fluid to keep your urine pale yellow. This information is not intended to replace advice given to you by your health care provider. Make sure you discuss any questions you have with your health care provider. Document Revised: 04/05/2020 Document Reviewed: 04/05/2020 Elsevier Patient Education  [place].

## 2021-01-08 NOTE — Assessment & Plan Note (Signed)
Symptoms of dysuria and left flank pain not well controlled.  This is not new for patient.  Patient brings to clinic a kidney stone passed out 24 hours ago.  Patient is reporting increased pain and tenderness.  Patient is also reporting nausea and burning with urination.  Completed urinalysis that shows trace ketones, blood, leukocytes with hyaline cast crystals and calcium oxalate with moderate bacteria.  Patient currently sees nephrologist and  an appointment in a few weeks.  Patient would prefer stone to be reevaluated by nephrologist. Started patient on Cipro, Flomax, and tramadol for pain, and encouraged to push fluids..  Patient reports not getting effective pain resolution with NSAIDs.  Patient rates pain as moderate to severe. Education provided to patient with printed handouts given.  Follow-up with worsening or unresolved symptoms.

## 2021-01-08 NOTE — Progress Notes (Signed)
Acute Office Visit  Subjective:    Patient ID: Gabrielle Gallegos, female    DOB: Feb 18, 1956, 65 y.o.   MRN: [id number]  Chief Complaint  Patient presents with  . Nephrolithiasis  . Dysuria    Dysuria  This is a new problem. The current episode started in the past 7 days. The problem occurs every urination. The problem has been gradually worsening. The quality of the pain is described as aching and burning. The pain is severe. There has been no fever. There is a history of pyelonephritis. Associated symptoms include flank pain, hematuria and nausea. Pertinent negatives include no chills. She has tried nothing for the symptoms. Her past medical history is significant for kidney stones.     Past Medical History:  Diagnosis Date  . Acute exacerbation of chronic obstructive pulmonary disease ([place])    05-27-2016 dx started on omnicef, oral prednisone  . Bladder tumor   . COPD (chronic obstructive pulmonary disease) ([place])   . Depression   . Fatigue   . GAD (generalized anxiety disorder)   . History of adenomatous polyp of colon    tubular adenoma's  10-30-2015  . History of bladder stone    01/ 2008  . History of chronic bronchitis    last bout 04-02-2016  . History of kidney stones   . History of panic attacks   . Hyperlipidemia   . Mixed stress and urge urinary incontinence   . Nephrolithiasis    bilateral nonobstructive per ct 05/ 2017  . Neuropathy   . Productive cough   . Renal cyst, left   . RLS (restless legs syndrome)   . Tachycardia 05/31/2014  . Tachycardia, paroxysmal [facility])    last cardiologist-  dr Mare Ferrari  09/ 2015  felt to stress/ anxiety related  . Urgency of urination   . Wears glasses     Past Surgical History:  Procedure Laterality Date  . ABDOMINAL HYSTERECTOMY  1983  . CARPAL TUNNEL RELEASE Left 1992  . EXCISION GANGLION RIGHT LITTLE FINGER  03/23/2001  . EXTRACORPOREAL SHOCK WAVE LITHOTRIPSY  2015  approx  . KNEE ARTHROSCOPY Left 1994  . KNEE  SURGERY Left 1976  . ORIF GREAT TOE  Left 2007  approx  . TRANSTHORACIC ECHOCARDIOGRAM  05/23/2014   mild LVH,  ef 60-65%  . TRANSURETHRAL RESECTION OF BLADDER TUMOR WITH MITOMYCIN-C N/A 06/01/2016   Procedure: TRANSURETHRAL RESECTION OF BLADDER TUMOR WITH MITOMYCIN-C IN PACU POST OP;  Surgeon: Kathie Rhodes, MD;  Location: [place];  Service: Urology;  Laterality: N/A;  . ULNAR NERVE REPAIR Left 1994    Family History  Problem Relation Age of Onset  . Heart disease Mother   . Cancer Father   . Lung cancer Sister   . Diabetes Brother     Social History   Socioeconomic History  . Marital status: Married    Spouse name: Not on file  . Number of children: Not on file  . Years of education: Not on file  . Highest education level: Not on file  Occupational History  . Not on file  Tobacco Use  . Smoking status: Current Every Day Smoker    Packs/day: 0.10    Years: 30.00    Pack years: 3.00    Types: Cigarettes  . Smokeless tobacco: Never Used  Vaping Use  . Vaping Use: Never used  Substance and Sexual Activity  . Alcohol use: No    Alcohol/week: 0.0 standard drinks  . Drug  use: No  . Sexual activity: Not on file  Other Topics Concern  . Not on file  Social History Narrative  . Not on file   Social Determinants of Health   Financial Resource Strain: Not on file  Food Insecurity: Not on file  Transportation Needs: Not on file  Physical Activity: Not on file  Stress: Not on file  Social Connections: Not on file  Intimate Partner Violence: Not on file    Outpatient Medications Prior to Visit  Medication Sig Dispense Refill  . albuterol (VENTOLIN HFA) 108 (90 Base) MCG/ACT inhaler INHALE 2 PUFFS BY MOUTH EVERY 6 HOURS AS NEEDED FOR WHEEZING AND FOR SHORTNESS OF BREATH 18 g 3  . busPIRone (BUSPAR) 15 MG tablet Take 1 tablet (15 mg total) by mouth 2 (two) times daily. 60 tablet prn  . carvedilol (COREG) 25 MG tablet Take 1.5 tablets (37.5 mg total) by  mouth 2 (two) times daily with a meal. 270 tablet 3  . diazepam (VALIUM) 2 MG tablet Take 2 mg by mouth every 8 (eight) hours as needed.    . famotidine (PEPCID) 20 MG tablet Take 1 tablet (20 mg total) by mouth at bedtime. 90 tablet 3  . Fluticasone-Umeclidin-Vilant (TRELEGY ELLIPTA) 100-62.5-25 MCG/INH AEPB Inhale 1 puff into the lungs daily. 60 each 12  . gabapentin (NEURONTIN) 300 MG capsule Take 1 capsule (300 mg total) by mouth 3 (three) times daily. 270 capsule 3  . losartan (COZAAR) 50 MG tablet Take 1 tablet (50 mg total) by mouth daily. (NEEDS TO BE SEEN BEFORE NEXT REFILL) 30 tablet 0  . meloxicam (MOBIC) 15 MG tablet Take 15 mg by mouth daily.    . nicotine (NICODERM CQ - DOSED IN MG/24 HOURS) 21 mg/24hr patch Place onto the skin.    . Omega-3 Fatty Acids (FISH OIL) 1000 MG CAPS Take by mouth.    Marland Kitchen omeprazole (PRILOSEC) 20 MG capsule Take 1 capsule by mouth once daily 90 capsule 0  . ondansetron (ZOFRAN) 4 MG tablet Take by mouth.    . pramipexole (MIRAPEX) 0.5 MG tablet TAKE 1 TABLET BY MOUTH AT BEDTIME. MAY REPEAT ONCE DAILY IF NEEDED  (NEEDS TO BE SEEN BEFORE NEXT REFILL) 40 tablet 0  . prazosin (MINIPRESS) 1 MG capsule Take 1-2 capsules (1-2 mg total) by mouth at bedtime. For sleep/ nightmares 60 capsule prn  . sertraline (ZOLOFT) 100 MG tablet Take 1 tablet by mouth once daily 90 tablet 0  . tiZANidine (ZANAFLEX) 4 MG tablet TAKE 1 TABLET BY MOUTH EVERY 8 HOURS AS NEEDED FOR MUSCLE SPASM 90 tablet prn  . Vitamin D, Ergocalciferol, (DRISDOL) 1.25 MG (50000 UNIT) CAPS capsule Take 1 capsule (50,000 Units total) by mouth every 7 (seven) days. x12 weeks then switch to OTC VIt D 800 IU / day 12 capsule 0   No facility-administered medications prior to visit.    Allergies  Allergen Reactions  . Diltiazem Shortness Of Breath    , bloated  . Metoprolol Nausea And Vomiting    Review of Systems  Constitutional: Negative for chills.  Respiratory: Negative.   Cardiovascular:  Negative.   Gastrointestinal: Positive for nausea.  Genitourinary: Positive for dysuria, flank pain and hematuria.  All other systems reviewed and are negative.      Objective:    Physical Exam Vitals and nursing note reviewed.  Constitutional:      Appearance: Normal appearance.  HENT:     Head: Normocephalic.     Nose: Nose normal.  Eyes:     Conjunctiva/sclera: Conjunctivae normal.  Cardiovascular:     Rate and Rhythm: Normal rate and regular rhythm.     Pulses: Normal pulses.     Heart sounds: Normal heart sounds.  Pulmonary:     Effort: Pulmonary effort is normal.     Breath sounds: Normal breath sounds.  Abdominal:     General: Bowel sounds are normal.     Palpations: Abdomen is soft.     Tenderness: There is abdominal tenderness. There is left CVA tenderness.  Neurological:     Mental Status: She is alert and oriented to person, place, and time.     BP 136/90   Pulse (!) 122   Temp (!) 97.4 F (36.3 C) (Temporal)   Ht 5\' 3"  (1.6 m)   Wt 134 lb (60.8 kg)   SpO2 93%   BMI 23.74 kg/m  Wt Readings from Last 3 Encounters:  01/08/21 134 lb (60.8 kg)  09/11/20 144 lb (65.3 kg)  04/24/20 138 lb (62.6 kg)    Health Maintenance Due  Topic Date Due  . PAP SMEAR-Modifier  06/28/2011  . MAMMOGRAM  12/05/2017  . COLONOSCOPY (Pts 45-50yrs Insurance coverage will need to be confirmed)  10/29/2018  . COVID-19 Vaccine (2 - Booster for [place]sen series) 02/09/2020    There are no preventive care reminders to display for this patient.   Lab Results  Component Value Date   TSH 0.588 04/24/2020   Lab Results  Component Value Date   WBC 10.4 04/24/2020   HGB 16.5 (H) 04/24/2020   HCT 49.3 (H) 04/24/2020   MCV 85 04/24/2020   PLT 272 04/24/2020   Lab Results  Component Value Date   NA 137 04/24/2020   K 4.0 04/24/2020   CO2 25 04/24/2020   GLUCOSE 115 (H) 04/24/2020   BUN 15 04/24/2020   CREATININE 0.76 04/24/2020   BILITOT <0.2 04/24/2020   ALKPHOS 114  04/24/2020   AST 16 04/24/2020   ALT 13 04/24/2020   PROT 7.9 04/24/2020   ALBUMIN 4.3 04/24/2020   CALCIUM 9.5 04/24/2020   Lab Results  Component Value Date   CHOL 328 (H) 08/29/2015   Lab Results  Component Value Date   HDL 42 08/29/2015   Lab Results  Component Value Date   LDLCALC Comment 08/29/2015   Lab Results  Component Value Date   TRIG 1,039 ([place]) 08/29/2015   Lab Results  Component Value Date   CHOLHDL 7.8 (H) 08/29/2015   No results found for: HGBA1C     Assessment & Plan:   Problem List Items Addressed This Visit      Genitourinary   Pyelonephritis - Primary   Relevant Medications   traMADol (ULTRAM) 50 MG tablet   sulfamethoxazole-trimethoprim (BACTRIM DS) 800-160 MG tablet     Other   Dysuria    Symptoms of dysuria and left flank pain not well controlled.  This is not new for patient.  Patient brings to clinic a kidney stone passed out 24 hours ago.  Patient is reporting increased pain and tenderness.  Patient is also reporting nausea and burning with urination.  Completed urinalysis that shows trace ketones, blood, leukocytes with hyaline cast crystals and calcium oxalate with moderate bacteria.  Patient currently sees nephrologist and  an appointment in a few weeks.  Patient would prefer stone to be reevaluated by nephrologist. Started patient on Cipro, Flomax, and tramadol for pain, and encouraged to push fluids..  Patient reports not getting  effective pain resolution with NSAIDs.  Patient rates pain as moderate to severe. Education provided to patient with printed handouts given.  Follow-up with worsening or unresolved symptoms.      Relevant Orders   Urinalysis, Routine w reflex microscopic (Completed)   Urine Culture       Meds ordered this encounter  Medications  . traMADol (ULTRAM) 50 MG tablet    Sig: Take 1 tablet (50 mg total) by mouth every 8 (eight) hours as needed for up to 5 days.    Dispense:  15 tablet    Refill:  0    Order  Specific Question:   Supervising Provider    Answer:   Janora Norlander [[id number]]  . sulfamethoxazole-trimethoprim (BACTRIM DS) 800-160 MG tablet    Sig: Take 1 tablet by mouth 2 (two) times daily.    Dispense:  20 tablet    Refill:  0    Order Specific Question:   Supervising Provider    Answer:   Janora Norlander [[id number]]     Ivy Lynn, NP

## 2021-01-10 LAB — URINE CULTURE

## 2021-01-14 ENCOUNTER — Other Ambulatory Visit: Payer: Self-pay | Admitting: Family Medicine

## 2021-01-14 DIAGNOSIS — F411 Generalized anxiety disorder: Secondary | ICD-10-CM

## 2021-01-14 DIAGNOSIS — F324 Major depressive disorder, single episode, in partial remission: Secondary | ICD-10-CM

## 2021-01-25 ENCOUNTER — Other Ambulatory Visit: Payer: Self-pay | Admitting: Family Medicine

## 2021-01-25 DIAGNOSIS — G2581 Restless legs syndrome: Secondary | ICD-10-CM

## 2021-01-25 DIAGNOSIS — F411 Generalized anxiety disorder: Secondary | ICD-10-CM

## 2021-01-25 DIAGNOSIS — F324 Major depressive disorder, single episode, in partial remission: Secondary | ICD-10-CM

## 2021-02-08 ENCOUNTER — Other Ambulatory Visit: Payer: Self-pay | Admitting: Family Medicine

## 2021-02-08 DIAGNOSIS — F324 Major depressive disorder, single episode, in partial remission: Secondary | ICD-10-CM

## 2021-02-08 DIAGNOSIS — F411 Generalized anxiety disorder: Secondary | ICD-10-CM

## 2021-02-26 ENCOUNTER — Other Ambulatory Visit: Payer: Self-pay | Admitting: Family Medicine

## 2021-02-26 DIAGNOSIS — G2581 Restless legs syndrome: Secondary | ICD-10-CM

## 2021-02-26 DIAGNOSIS — F411 Generalized anxiety disorder: Secondary | ICD-10-CM

## 2021-02-26 DIAGNOSIS — F324 Major depressive disorder, single episode, in partial remission: Secondary | ICD-10-CM

## 2021-03-07 ENCOUNTER — Other Ambulatory Visit: Payer: Self-pay | Admitting: Family Medicine

## 2021-03-07 DIAGNOSIS — F324 Major depressive disorder, single episode, in partial remission: Secondary | ICD-10-CM

## 2021-03-07 DIAGNOSIS — F411 Generalized anxiety disorder: Secondary | ICD-10-CM

## 2021-03-14 ENCOUNTER — Other Ambulatory Visit: Payer: Self-pay | Admitting: Family Medicine

## 2021-03-14 DIAGNOSIS — F324 Major depressive disorder, single episode, in partial remission: Secondary | ICD-10-CM

## 2021-03-14 DIAGNOSIS — F411 Generalized anxiety disorder: Secondary | ICD-10-CM

## 2021-03-20 ENCOUNTER — Telehealth: Payer: Self-pay | Admitting: Family Medicine

## 2021-03-20 DIAGNOSIS — G2581 Restless legs syndrome: Secondary | ICD-10-CM

## 2021-03-20 DIAGNOSIS — F4329 Adjustment disorder with other symptoms: Secondary | ICD-10-CM

## 2021-03-20 DIAGNOSIS — I10 Essential (primary) hypertension: Secondary | ICD-10-CM

## 2021-03-20 DIAGNOSIS — K2100 Gastro-esophageal reflux disease with esophagitis, without bleeding: Secondary | ICD-10-CM

## 2021-03-20 MED ORDER — OMEPRAZOLE 20 MG PO CPDR: 20.0000 mg | DELAYED_RELEASE_CAPSULE | Freq: Every day | ORAL | 0 refills | Status: DC

## 2021-03-20 MED ORDER — PRAZOSIN HCL 1 MG PO CAPS: 1.0000 mg | ORAL_CAPSULE | Freq: Every day | ORAL | 0 refills | Status: DC

## 2021-03-20 MED ORDER — PRAMIPEXOLE DIHYDROCHLORIDE 0.5 MG PO TABS: ORAL_TABLET | ORAL | 0 refills | Status: DC

## 2021-03-20 MED ORDER — LOSARTAN POTASSIUM 50 MG PO TABS: 50.0000 mg | ORAL_TABLET | Freq: Every day | ORAL | 1 refills | Status: DC

## 2021-03-20 MED ORDER — TIZANIDINE HCL 4 MG PO TABS
ORAL_TABLET | ORAL | 99 refills | Status: DC
Start: 2021-03-20 — End: 2021-10-10

## 2021-03-20 NOTE — Telephone Encounter (Signed)
  Prescription Request  03/20/2021  What is the name of the medication or equipment? pramipexole (MIRAPEX) 0.5 MG tablet  Trazadone tiZANidine (ZANAFLEX) 4 MG tablet omeprazole (PRILOSEC) 20 MG capsule  Have you contacted your pharmacy to request a refill? (if applicable) yes  Which pharmacy would you like this sent to? [facility] [place], pt has appt on 05/07/21 with Dr. Darnell Level. Needs enough until appt   Patient notified that their request is being sent to the clinical staff for review and that they should receive a response within 2 business days.

## 2021-04-01 ENCOUNTER — Other Ambulatory Visit: Payer: Self-pay | Admitting: Family Medicine

## 2021-04-01 DIAGNOSIS — F411 Generalized anxiety disorder: Secondary | ICD-10-CM

## 2021-04-01 DIAGNOSIS — F324 Major depressive disorder, single episode, in partial remission: Secondary | ICD-10-CM

## 2021-04-08 ENCOUNTER — Ambulatory Visit (INDEPENDENT_AMBULATORY_CARE_PROVIDER_SITE_OTHER): Payer: Managed Care, Other (non HMO) | Admitting: Nurse Practitioner

## 2021-04-08 ENCOUNTER — Encounter: Payer: Self-pay | Admitting: Nurse Practitioner

## 2021-04-08 ENCOUNTER — Other Ambulatory Visit: Payer: Self-pay

## 2021-04-08 VITALS — BP 188/102 | HR 69 | Temp 97.2°F | Ht 63.0 in | Wt 136.0 lb

## 2021-04-08 DIAGNOSIS — R300 Dysuria: Secondary | ICD-10-CM | POA: Diagnosis not present

## 2021-04-08 DIAGNOSIS — R399 Unspecified symptoms and signs involving the genitourinary system: Secondary | ICD-10-CM | POA: Insufficient documentation

## 2021-04-08 DIAGNOSIS — I10 Essential (primary) hypertension: Secondary | ICD-10-CM | POA: Diagnosis not present

## 2021-04-08 DIAGNOSIS — R109 Unspecified abdominal pain: Secondary | ICD-10-CM

## 2021-04-08 LAB — URINALYSIS, ROUTINE W REFLEX MICROSCOPIC
Bilirubin, UA: NEGATIVE
Glucose, UA: NEGATIVE
Ketones, UA: NEGATIVE
Leukocytes,UA: NEGATIVE
Nitrite, UA: NEGATIVE
Specific Gravity, UA: 1.02 (ref 1.005–1.030)
Urobilinogen, Ur: 0.2 mg/dL (ref 0.2–1.0)
pH, UA: 5.5 (ref 5.0–7.5)

## 2021-04-08 LAB — URINALYSIS, COMPLETE
Bilirubin, UA: NEGATIVE
Glucose, UA: NEGATIVE
Ketones, UA: NEGATIVE
Leukocytes,UA: NEGATIVE
Nitrite, UA: NEGATIVE
Specific Gravity, UA: 1.02 (ref 1.005–1.030)
Urobilinogen, Ur: 0.2 mg/dL (ref 0.2–1.0)
pH, UA: 5.5 (ref 5.0–7.5)

## 2021-04-08 LAB — MICROSCOPIC EXAMINATION
Renal Epithel, UA: NONE SEEN /hpf
WBC, UA: NONE SEEN /hpf (ref 0–5)

## 2021-04-08 MED ORDER — LOSARTAN POTASSIUM 100 MG PO TABS: 100.0000 mg | ORAL_TABLET | Freq: Every day | ORAL | 0 refills | Status: DC

## 2021-04-08 MED ORDER — CEPHALEXIN 500 MG PO CAPS: 500.0000 mg | ORAL_CAPSULE | Freq: Two times a day (BID) | ORAL | 0 refills | Status: DC

## 2021-04-08 NOTE — Progress Notes (Signed)
Acute Office Visit  Subjective:    Patient ID: Gabrielle Gallegos, female    DOB: 06-30-1956, 65 y.o.   MRN: [id number]  Chief Complaint  Patient presents with  . Back Pain    Back Pain This is a new problem. The current episode started yesterday. The problem is unchanged. The quality of the pain is described as aching. The pain is severe. The pain is The same all the time. The symptoms are aggravated by bending. Associated symptoms include dysuria. Pertinent negatives include no headaches.  Hypertension This is a chronic problem. The current episode started more than 1 year ago. The problem has been gradually worsening since onset. Associated symptoms include anxiety. Pertinent negatives include no headaches, palpitations or shortness of breath. Past treatments include calcium channel blockers. The current treatment provides mild improvement.  Dysuria  This is a new problem. The current episode started in the past 7 days. The problem occurs intermittently. The problem has been gradually worsening. The quality of the pain is described as aching. The pain is at a severity of 8/10. The pain is severe. There has been no fever. There is A history of pyelonephritis. Associated symptoms include chills, flank pain, frequency and nausea. She has tried acetaminophen for the symptoms. The treatment provided no relief.      Past Medical History:  Diagnosis Date  . Acute exacerbation of chronic obstructive pulmonary disease ([place])    05-27-2016 dx started on omnicef, oral prednisone  . Bladder tumor   . COPD (chronic obstructive pulmonary disease) ([place])   . Depression   . Fatigue   . GAD (generalized anxiety disorder)   . History of adenomatous polyp of colon    tubular adenoma's  10-30-2015  . History of bladder stone    01/ 2008  . History of chronic bronchitis    last bout 04-02-2016  . History of kidney stones   . History of panic attacks   . Hyperlipidemia   . Mixed stress and urge  urinary incontinence   . Nephrolithiasis    bilateral nonobstructive per ct 05/ 2017  . Neuropathy   . Productive cough   . Renal cyst, left   . RLS (restless legs syndrome)   . Tachycardia 05/31/2014  . Tachycardia, paroxysmal [facility])    last cardiologist-  dr Mare Ferrari  09/ 2015  felt to stress/ anxiety related  . Urgency of urination   . Wears glasses     Past Surgical History:  Procedure Laterality Date  . ABDOMINAL HYSTERECTOMY  1983  . CARPAL TUNNEL RELEASE Left 1992  . EXCISION GANGLION RIGHT LITTLE FINGER  03/23/2001  . EXTRACORPOREAL SHOCK WAVE LITHOTRIPSY  2015  approx  . KNEE ARTHROSCOPY Left 1994  . KNEE SURGERY Left 1976  . ORIF GREAT TOE  Left 2007  approx  . TRANSTHORACIC ECHOCARDIOGRAM  05/23/2014   mild LVH,  ef 60-65%  . TRANSURETHRAL RESECTION OF BLADDER TUMOR WITH MITOMYCIN-C N/A 06/01/2016   Procedure: TRANSURETHRAL RESECTION OF BLADDER TUMOR WITH MITOMYCIN-C IN PACU POST OP;  Surgeon: Kathie Rhodes, MD;  Location: [facility];  Service: Urology;  Laterality: N/A;  . ULNAR NERVE REPAIR Left 1994    Family History  Problem Relation Age of Onset  . Heart disease Mother   . Cancer Father   . Lung cancer Sister   . Diabetes Brother     Social History   Socioeconomic History  . Marital status: Married    Spouse name: Not on file  .  Number of children: Not on file  . Years of education: Not on file  . Highest education level: Not on file  Occupational History  . Not on file  Tobacco Use  . Smoking status: Every Day    Packs/day: 0.10    Years: 30.00    Pack years: 3.00    Types: Cigarettes  . Smokeless tobacco: Never  Vaping Use  . Vaping Use: Never used  Substance and Sexual Activity  . Alcohol use: No    Alcohol/week: 0.0 standard drinks  . Drug use: No  . Sexual activity: Not on file  Other Topics Concern  . Not on file  Social History Narrative  . Not on file   Social Determinants of Health   Financial Resource Strain:  Not on file  Food Insecurity: Not on file  Transportation Needs: Not on file  Physical Activity: Not on file  Stress: Not on file  Social Connections: Not on file  Intimate Partner Violence: Not on file    Outpatient Medications Prior to Visit  Medication Sig Dispense Refill  . albuterol (VENTOLIN HFA) 108 (90 Base) MCG/ACT inhaler INHALE 2 PUFFS BY MOUTH EVERY 6 HOURS AS NEEDED FOR WHEEZING AND FOR SHORTNESS OF BREATH 18 g 3  . busPIRone (BUSPAR) 15 MG tablet Take 1 tablet (15 mg total) by mouth 2 (two) times daily. (Patient not taking: Reported on 04/08/2021) 60 tablet prn  . carvedilol (COREG) 25 MG tablet Take 1.5 tablets (37.5 mg total) by mouth 2 (two) times daily with a meal. 270 tablet 3  . diazepam (VALIUM) 2 MG tablet Take 2 mg by mouth every 8 (eight) hours as needed.    . famotidine (PEPCID) 20 MG tablet Take 1 tablet (20 mg total) by mouth at bedtime. 90 tablet 3  . Fluticasone-Umeclidin-Vilant (TRELEGY ELLIPTA) 100-62.5-25 MCG/INH AEPB Inhale 1 puff into the lungs daily. 60 each 12  . gabapentin (NEURONTIN) 300 MG capsule TAKE 1 CAPSULE BY MOUTH THREE TIMES DAILY 270 capsule 0  . nicotine (NICODERM CQ - DOSED IN MG/24 HOURS) 21 mg/24hr patch Place onto the skin.    . Omega-3 Fatty Acids (FISH OIL) 1000 MG CAPS Take by mouth.    Marland Kitchen omeprazole (PRILOSEC) 20 MG capsule Take 1 capsule (20 mg total) by mouth daily. 90 capsule 0  . ondansetron (ZOFRAN) 4 MG tablet Take by mouth.    . pramipexole (MIRAPEX) 0.5 MG tablet TAKE 1 TABLET BY MOUTH ONCE DAILY, MAY REPEAT ONCE DAILY IF NEEDED 40 tablet 0  . prazosin (MINIPRESS) 1 MG capsule Take 1-2 capsules (1-2 mg total) by mouth at bedtime. For sleep/ nightmares 60 capsule 0  . sertraline (ZOLOFT) 100 MG tablet Take 1 tablet by mouth once daily 90 tablet 0  . sulfamethoxazole-trimethoprim (BACTRIM DS) 800-160 MG tablet Take 1 tablet by mouth 2 (two) times daily. 20 tablet 0  . tiZANidine (ZANAFLEX) 4 MG tablet TAKE 1 TABLET BY MOUTH EVERY 8  HOURS AS NEEDED FOR MUSCLE SPASM 60 tablet prn  . Vitamin D, Ergocalciferol, (DRISDOL) 1.25 MG (50000 UNIT) CAPS capsule Take 1 capsule (50,000 Units total) by mouth every 7 (seven) days. x12 weeks then switch to OTC VIt D 800 IU / day 12 capsule 0  . losartan (COZAAR) 50 MG tablet Take 1 tablet (50 mg total) by mouth daily. (NEEDS TO BE SEEN BEFORE NEXT REFILL) 30 tablet 1  . meloxicam (MOBIC) 15 MG tablet Take 15 mg by mouth daily.     No facility-administered  medications prior to visit.    Allergies  Allergen Reactions  . Diltiazem Shortness Of Breath    , bloated  . Metoprolol Nausea And Vomiting    Review of Systems  Constitutional:  Positive for chills.  Respiratory:  Negative for shortness of breath.   Cardiovascular:  Negative for palpitations.  Gastrointestinal:  Positive for nausea.  Genitourinary:  Positive for dysuria, flank pain and frequency.  Musculoskeletal:  Positive for back pain.  Neurological:  Negative for headaches.  All other systems reviewed and are negative.     Objective:    Physical Exam Vitals reviewed.  Constitutional:      Appearance: Normal appearance.  HENT:     Head: Normocephalic.     Nose: Nose normal.  Eyes:     Conjunctiva/sclera: Conjunctivae normal.  Cardiovascular:     Rate and Rhythm: Normal rate and regular rhythm.     Pulses: Normal pulses.     Heart sounds: Normal heart sounds.  Pulmonary:     Effort: Pulmonary effort is normal.     Breath sounds: Normal breath sounds.  Abdominal:     General: Bowel sounds are normal.  Musculoskeletal:     Lumbar back: Tenderness present.  Skin:    Findings: No rash.  Neurological:     Mental Status: She is alert and oriented to person, place, and time.  Psychiatric:        Behavior: Behavior normal.    BP (!) 188/102   Pulse 69   Temp (!) 97.2 F (36.2 C) (Temporal)   Ht 5\' 3"  (1.6 m)   Wt 136 lb (61.7 kg)   BMI 24.09 kg/m  Wt Readings from Last 3 Encounters:  04/08/21 136  lb (61.7 kg)  01/08/21 134 lb (60.8 kg)  09/11/20 144 lb (65.3 kg)    Health Maintenance Due  Topic Date Due  . Pneumococcal Vaccine 41-3 Years old (1 - PCV) Never done  . Zoster Vaccines- Shingrix (1 of 2) Never done  . PAP SMEAR-Modifier  06/28/2011  . MAMMOGRAM  12/05/2017  . COLONOSCOPY (Pts 45-36yrs Insurance coverage will need to be confirmed)  10/29/2018  . COVID-19 Vaccine (2 - [facility] risk series) 01/12/2020  . INFLUENZA VACCINE  04/07/2021    There are no preventive care reminders to display for this patient.   Lab Results  Component Value Date   TSH 0.588 04/24/2020   Lab Results  Component Value Date   WBC 10.4 04/24/2020   HGB 16.5 (H) 04/24/2020   HCT 49.3 (H) 04/24/2020   MCV 85 04/24/2020   PLT 272 04/24/2020   Lab Results  Component Value Date   NA 137 04/24/2020   K 4.0 04/24/2020   CO2 25 04/24/2020   GLUCOSE 115 (H) 04/24/2020   BUN 15 04/24/2020   CREATININE 0.76 04/24/2020   BILITOT <0.2 04/24/2020   ALKPHOS 114 04/24/2020   AST 16 04/24/2020   ALT 13 04/24/2020   PROT 7.9 04/24/2020   ALBUMIN 4.3 04/24/2020   CALCIUM 9.5 04/24/2020   Lab Results  Component Value Date   CHOL 328 (H) 08/29/2015   Lab Results  Component Value Date   HDL 42 08/29/2015   Lab Results  Component Value Date   LDLCALC Comment 08/29/2015   Lab Results  Component Value Date   TRIG 1,039 ([place]) 08/29/2015   Lab Results  Component Value Date   CHOLHDL 7.8 (H) 08/29/2015   No results found for: HGBA1C     Assessment &  Plan:   Problem List Items Addressed This Visit       Cardiovascular and Mediastinum   Essential hypertension    Patient is in today for back pain and UTI symptoms but on assessment blood pressure is not well controlled.  Patient is reporting high stress and states that blood pressure will be better in the morning.  Blood pressure is 199/113, repeat 190/120, and after visit 188/102.  Advised patient to increase losartan from 50 mg  tablet by mouth daily 200 mg tablet by mouth daily, keep blood pressure log and follow-up in a week.  Patient verbalized understanding education provided with printed handouts given.       Relevant Medications   losartan (COZAAR) 100 MG tablet     Other   Dysuria    Symptoms of dysuria in the last few days with pelvic pain CVA tenderness nausea, and pain.  Urinalysis completed results reviewed with patient.  Urinalysis negative for nitrites and leukocytes, positive for bacteria and blood .  Started patient on Keflex waiting on urine cultures.       Relevant Medications   cephALEXin (KEFLEX) 500 MG capsule   Other Relevant Orders   Urinalysis, Routine w reflex microscopic (Completed)   Acute flank pain - Primary    Acute flank pain associated with UTI symptoms.  Completed urinalysis and urine cultures pending.  Started patient on Keflex, increase hydration, Tylenol for pain related.       Relevant Orders   Urinalysis, Routine w reflex microscopic (Completed)   UTI symptoms   Relevant Orders   Urine Culture     Meds ordered this encounter  Medications  . cephALEXin (KEFLEX) 500 MG capsule    Sig: Take 1 capsule (500 mg total) by mouth 2 (two) times daily.    Dispense:  14 capsule    Refill:  0    Order Specific Question:   Supervising Provider    Answer:   Janora Norlander [[id number]]  . losartan (COZAAR) 100 MG tablet    Sig: Take 1 tablet (100 mg total) by mouth daily.    Dispense:  90 tablet    Refill:  0    Order Specific Question:   Supervising Provider    Answer:   Janora Norlander [[id number]]     Ivy Lynn, NP

## 2021-04-08 NOTE — Patient Instructions (Signed)
Acute Back Pain, Adult Acute back pain is sudden and usually short-lived. It is often caused by an injury to the muscles and tissues in the back. The injury may result from: A muscle or ligament getting overstretched or torn (strained). Ligaments are tissues that connect bones to each other. Lifting something improperly can cause a back strain. Wear and tear (degeneration) of the spinal disks. Spinal disks are circular tissue that provide cushioning between the bones of the spine (vertebrae). Twisting motions, such as while playing sports or doing yard work. A hit to the back. Arthritis. You may have a physical exam, lab tests, and imaging tests to find the cause ofyour pain. Acute back pain usually goes away with rest and home care. Follow these instructions at home: Managing pain, stiffness, and swelling Treatment may include medicines for pain and inflammation that are taken by mouth or applied to the skin, prescription pain medicine, or muscle relaxants. Take over-the-counter and prescription medicines only as told by your health care provider. Your health care provider may recommend applying ice during the first 24-48 hours after your pain starts. To do this: Put ice in a plastic bag. Place a towel between your skin and the bag. Leave the ice on for 20 minutes, 2-3 times a day. If directed, apply heat to the affected area as often as told by your health care provider. Use the heat source that your health care provider recommends, such as a moist heat pack or a heating pad. Place a towel between your skin and the heat source. Leave the heat on for 20-30 minutes. Remove the heat if your skin turns bright red. This is especially important if you are unable to feel pain, heat, or cold. You have a greater risk of getting burned. Activity  Do not stay in bed. Staying in bed for more than 1-2 days can delay your recovery. Sit up and stand up straight. Avoid leaning forward when you sit or  hunching over when you stand. If you work at a desk, sit close to it so you do not need to lean over. Keep your chin tucked in. Keep your neck drawn back, and keep your elbows bent at a 90-degree angle (right angle). Sit high and close to the steering wheel when you drive. Add lower back (lumbar) support to your car seat, if needed. Take short walks on even surfaces as soon as you are able. Try to increase the length of time you walk each day. Do not sit, drive, or stand in one place for more than 30 minutes at a time. Sitting or standing for long periods of time can put stress on your back. Do not drive or use heavy machinery while taking prescription pain medicine. Use proper lifting techniques. When you bend and lift, use positions that put less stress on your back: Bend your knees. Keep the load close to your body. Avoid twisting. Exercise regularly as told by your health care provider. Exercising helps your back heal faster and helps prevent back injuries by keeping muscles strong and flexible. Work with a physical therapist to make a safe exercise program, as recommended by your health care provider. Do any exercises as told by your physical therapist.  Lifestyle Maintain a healthy weight. Extra weight puts stress on your back and makes it difficult to have good posture. Avoid activities or situations that make you feel anxious or stressed. Stress and anxiety increase muscle tension and can make back pain worse. Learn ways to manage   anxiety and stress, such as through exercise. General instructions Sleep on a firm mattress in a comfortable position. Try lying on your side with your knees slightly bent. If you lie on your back, put a pillow under your knees. Follow your treatment plan as told by your health care provider. This may include: Cognitive or behavioral therapy. Acupuncture or massage therapy. Meditation or yoga. Contact a health care provider if: You have pain that is not  relieved with rest or medicine. You have increasing pain going down into your legs or buttocks. Your pain does not improve after 2 weeks. You have pain at night. You lose weight without trying. You have a fever or chills. Get help right away if: You develop new bowel or bladder control problems. You have unusual weakness or numbness in your arms or legs. You develop nausea or vomiting. You develop abdominal pain. You feel faint. Summary Acute back pain is sudden and usually short-lived. Use proper lifting techniques. When you bend and lift, use positions that put less stress on your back. Take over-the-counter and prescription medicines and apply heat or ice as directed by your health care provider. This information is not intended to replace advice given to you by your health care provider. Make sure you discuss any questions you have with your healthcare provider. Document Revised: 05/14/2020 Document Reviewed: 05/17/2020 Elsevier Patient Education  2022 [employer]  

## 2021-04-08 NOTE — Assessment & Plan Note (Signed)
Patient is in today for back pain and UTI symptoms but on assessment blood pressure is not well controlled.  Patient is reporting high stress and states that blood pressure will be better in the morning.  Blood pressure is 199/113, repeat 190/120, and after visit 188/102.  Advised patient to increase losartan from 50 mg tablet by mouth daily 200 mg tablet by mouth daily, keep blood pressure log and follow-up in a week.  Patient verbalized understanding education provided with printed handouts given.

## 2021-04-08 NOTE — Assessment & Plan Note (Signed)
Symptoms of dysuria in the last few days with pelvic pain CVA tenderness nausea, and pain.  Urinalysis completed results reviewed with patient.  Urinalysis negative for nitrites and leukocytes, positive for bacteria and blood .  Started patient on Keflex waiting on urine cultures.

## 2021-04-08 NOTE — Assessment & Plan Note (Signed)
Acute flank pain associated with UTI symptoms.  Completed urinalysis and urine cultures pending.  Started patient on Keflex, increase hydration, Tylenol for pain related.

## 2021-04-09 ENCOUNTER — Other Ambulatory Visit: Payer: Self-pay | Admitting: Nurse Practitioner

## 2021-04-09 ENCOUNTER — Telehealth: Payer: Self-pay | Admitting: Family Medicine

## 2021-04-09 MED ORDER — IBUPROFEN 600 MG PO TABS: 600.0000 mg | ORAL_TABLET | Freq: Three times a day (TID) | ORAL | 1 refills | Status: DC | PRN

## 2021-04-09 NOTE — Telephone Encounter (Signed)
Pt aware per JE

## 2021-04-10 LAB — URINE CULTURE

## 2021-04-11 NOTE — Progress Notes (Signed)
Pt aware.

## 2021-04-13 ENCOUNTER — Other Ambulatory Visit: Payer: Self-pay | Admitting: Family Medicine

## 2021-04-13 DIAGNOSIS — F324 Major depressive disorder, single episode, in partial remission: Secondary | ICD-10-CM

## 2021-04-13 DIAGNOSIS — I479 Paroxysmal tachycardia, unspecified: Secondary | ICD-10-CM

## 2021-04-13 DIAGNOSIS — F411 Generalized anxiety disorder: Secondary | ICD-10-CM

## 2021-04-13 DIAGNOSIS — J449 Chronic obstructive pulmonary disease, unspecified: Secondary | ICD-10-CM

## 2021-05-07 ENCOUNTER — Other Ambulatory Visit: Payer: Self-pay

## 2021-05-07 ENCOUNTER — Encounter: Payer: Self-pay | Admitting: Family Medicine

## 2021-05-07 ENCOUNTER — Ambulatory Visit (INDEPENDENT_AMBULATORY_CARE_PROVIDER_SITE_OTHER): Payer: Managed Care, Other (non HMO) | Admitting: Family Medicine

## 2021-05-07 VITALS — BP 130/79 | HR 91 | Temp 97.9°F | Resp 20 | Ht 63.0 in | Wt 143.0 lb

## 2021-05-07 DIAGNOSIS — C3412 Malignant neoplasm of upper lobe, left bronchus or lung: Secondary | ICD-10-CM

## 2021-05-07 DIAGNOSIS — R569 Unspecified convulsions: Secondary | ICD-10-CM

## 2021-05-07 DIAGNOSIS — F410 Panic disorder [episodic paroxysmal anxiety] without agoraphobia: Secondary | ICD-10-CM | POA: Diagnosis not present

## 2021-05-07 DIAGNOSIS — F5104 Psychophysiologic insomnia: Secondary | ICD-10-CM

## 2021-05-07 DIAGNOSIS — J449 Chronic obstructive pulmonary disease, unspecified: Secondary | ICD-10-CM

## 2021-05-07 DIAGNOSIS — F411 Generalized anxiety disorder: Secondary | ICD-10-CM

## 2021-05-07 MED ORDER — BREZTRI AEROSPHERE 160-9-4.8 MCG/ACT IN AERO: 2.0000 | INHALATION_SPRAY | Freq: Two times a day (BID) | RESPIRATORY_TRACT | 11 refills | Status: DC

## 2021-05-07 MED ORDER — TRAZODONE HCL 100 MG PO TABS: 50.0000 mg | ORAL_TABLET | Freq: Every evening | ORAL | 3 refills | Status: DC | PRN

## 2021-05-07 NOTE — Progress Notes (Signed)
Subjective: CC:GAD PCP: Janora Norlander, DO QHU:TMLYY Gabrielle Gallegos is a 65 y.o. female presenting to clinic today for:  1. GAD/seizure-like activity Does report increased anxiety.  She has been given Valium by her oncologist and uses this on an almost daily basis.  She admits that over the last month she has been having some seizure-like activity where she will wake up in the middle of the night, feel cold and then have a total contracture of her entire body where she has some mild convulsion.  This has been observed by her spouse.  She does admit that she is had a seizure in the past in her early 4s after she had a cyst removal from her groin.  She was seen by neurology greater than 25 years ago and told that she had some type of seizure disorder but she cannot recall what medication she was prescribed.  She has been off of medication for this issue for several decades because symptoms had resolved on their own.  She admits to some increased stressors due to cancer diagnosis and responsibilities at home with her debilitated child.  She did mention this to her cancer doctor but it was not felt to be secondary to her cancer treatments.  2.  Insomnia Patient notes that she had issues with prazosin causing weird dreams so she discontinued roughly 2 weeks ago.  She has been using some of the Belsomra that we prescribed previously but this also gives her strange dreams and she would like to resume use of trazodone 100 mg.  3.  COPD Patient reports that the Judithann Sauger was more helpful than the Trelegy is and she would like to resume use of that inhaler.  No hemoptysis.  No thrush symptoms.  Some days breathing is worse than others.   ROS: Per HPI  Allergies  Allergen Reactions   Diltiazem Shortness Of Breath    , bloated   Metoprolol Nausea And Vomiting   Past Medical History:  Diagnosis Date   Acute exacerbation of chronic obstructive pulmonary disease ([place])    05-27-2016 dx started on  omnicef, oral prednisone   Bladder tumor    COPD (chronic obstructive pulmonary disease) ([place])    Depression    Fatigue    GAD (generalized anxiety disorder)    History of adenomatous polyp of colon    tubular adenoma'Gabrielle  10-30-2015   History of bladder stone    01/ 2008   History of chronic bronchitis    last bout 04-02-2016   History of kidney stones    History of panic attacks    Hyperlipidemia    Mixed stress and urge urinary incontinence    Nephrolithiasis    bilateral nonobstructive per ct 05/ 2017   Neuropathy    Productive cough    Renal cyst, left    RLS (restless legs syndrome)    Tachycardia 05/31/2014   Tachycardia, paroxysmal ([place])    last cardiologist-  dr Mare Ferrari  09/ 2015  felt to stress/ anxiety related   Urgency of urination    Wears glasses     Current Outpatient Medications:    albuterol (VENTOLIN HFA) 108 (90 Base) MCG/ACT inhaler, INHALE 2 PUFFS BY MOUTH EVERY 6 HOURS AS NEEDED FOR WHEEZING AND FOR SHORTNESS OF BREATH, Disp: 9 g, Rfl: 0   carvedilol (COREG) 25 MG tablet, Take 1.5 tablets (37.5 mg total) by mouth 2 (two) times daily with a meal., Disp: 270 tablet, Rfl: 3   diazepam (VALIUM) 2  MG tablet, Take 2 mg by mouth every 8 (eight) hours as needed., Disp: , Rfl:    Fluticasone-Umeclidin-Vilant (TRELEGY ELLIPTA) 100-62.5-25 MCG/INH AEPB, Inhale 1 puff into the lungs daily., Disp: 60 each, Rfl: 12   gabapentin (NEURONTIN) 300 MG capsule, TAKE 1 CAPSULE BY MOUTH THREE TIMES DAILY, Disp: 270 capsule, Rfl: 0   ibuprofen (ADVIL) 600 MG tablet, Take 1 tablet (600 mg total) by mouth every 8 (eight) hours as needed., Disp: 30 tablet, Rfl: 1   losartan (COZAAR) 100 MG tablet, Take 1 tablet (100 mg total) by mouth daily., Disp: 90 tablet, Rfl: 0   nicotine (NICODERM CQ - DOSED IN MG/24 HOURS) 21 mg/24hr patch, Place onto the skin., Disp: , Rfl:    omeprazole (PRILOSEC) 20 MG capsule, Take 1 capsule (20 mg total) by mouth daily., Disp: 90 capsule, Rfl: 0    pramipexole (MIRAPEX) 0.5 MG tablet, TAKE 1 TABLET BY MOUTH ONCE DAILY, MAY REPEAT ONCE DAILY IF NEEDED, Disp: 40 tablet, Rfl: 0   prazosin (MINIPRESS) 1 MG capsule, Take 1-2 capsules (1-2 mg total) by mouth at bedtime. For sleep/ nightmares, Disp: 60 capsule, Rfl: 0   tiZANidine (ZANAFLEX) 4 MG tablet, TAKE 1 TABLET BY MOUTH EVERY 8 HOURS AS NEEDED FOR MUSCLE SPASM, Disp: 60 tablet, Rfl: prn Social History   Socioeconomic History   Marital status: Married    Spouse name: Not on file   Number of children: Not on file   Years of education: Not on file   Highest education level: Not on file  Occupational History   Not on file  Tobacco Use   Smoking status: Every Day    Packs/day: 0.10    Years: 30.00    Pack years: 3.00    Types: Cigarettes   Smokeless tobacco: Never  Vaping Use   Vaping Use: Never used  Substance and Sexual Activity   Alcohol use: No    Alcohol/week: 0.0 standard drinks   Drug use: No   Sexual activity: Not on file  Other Topics Concern   Not on file  Social History Narrative   Not on file   Social Determinants of Health   Financial Resource Strain: Not on file  Food Insecurity: Not on file  Transportation Needs: Not on file  Physical Activity: Not on file  Stress: Not on file  Social Connections: Not on file  Intimate Partner Violence: Not on file   Family History  Problem Relation Age of Onset   Heart disease Mother    Cancer Father    Lung cancer Sister    Diabetes Brother     Objective: Office vital signs reviewed. BP 130/79   Pulse 91   Temp 97.9 F (36.6 C)   Resp 20   Ht 5\' 3"  (1.6 m)   Wt 143 lb (64.9 kg)   SpO2 97%   BMI 25.33 kg/m   Physical Examination:  General: Awake, alert, nontoxic female, No acute distress HEENT: Normal; sclera white.  PERRLA, EOMI Cardio: regular rate and rhythm, S1S2 heard, no murmurs appreciated Pulm: Globally decreased breath sounds without wheezing, rales or rhonchi.  Normal work of breathing on  room air. MSK: Normal gait and station Neuro: Cranial nerves II through XII grossly intact.  No focal neurologic deficits.   Psych: Mood stable.  Speech normal  Depression screen [facility] 2/9 05/07/2021 01/08/2021 09/11/2020  Decreased Interest 0 1 0  Down, Depressed, Hopeless 0 0 0  PHQ - 2 Score 0 1 0  Altered  sleeping 3 1 0  Tired, decreased energy 3 1 0  Change in appetite 2 1 0  Feeling bad or failure about yourself  2 0 0  Trouble concentrating 0 0 0  Moving slowly or fidgety/restless 0 0 0  Suicidal thoughts 0 0 -  PHQ-9 Score 10 4 0  Difficult doing work/chores Somewhat difficult Not difficult at all -  Some recent data might be hidden   GAD 7 : Generalized Anxiety Score 05/07/2021 09/11/2020 04/24/2020 10/31/2019  Nervous, Anxious, on Edge 3 2 1 3   Control/stop worrying 3 0 2 1  Worry too much - different things 3 0 2 1  Trouble relaxing 3 0 1 1  Restless 3 0 1 1  Easily annoyed or irritable 3 2 1 1   Afraid - awful might happen 3 0 1 1  Total GAD 7 Score 21 4 9 9   Anxiety Difficulty Somewhat difficult Somewhat difficult Somewhat difficult Somewhat difficult     Assessment/ Plan: 65 y.o. female   GAD (generalized anxiety disorder)  Panic attacks  Controlled substance agreement signed - Plan: ToxASSURE Select 13 (MW), Urine  Malignant neoplasm of upper lobe of left lung (HCC)  Chronic obstructive pulmonary disease, unspecified COPD type ([place]) - Plan: Budeson-Glycopyrrol-Formoterol (BREZTRI AEROSPHERE) 160-9-4.8 MCG/ACT AERO  Psychophysiological insomnia - Plan: traZODone (DESYREL) 100 MG tablet  Seizure-like activity ([place]) - Plan: Ambulatory referral to Neurology  Being prescribed Valium by her specialist.  Discussed that her seizure-like symptoms are concerning and that if she is had any lapse in the benzodiazepine given now chronic use of this that this may be precipitating some withdrawal.  However, she has had a history of seizure and therefore I would like her to see  neurology again.  She certainly has uncontrolled anxiety and depressive symptoms.  We are reinitiating trazodone so would like to see how she responds to this medication before escalating medication further.  She has as needed benzo on hand if needed.  We will plan to reassess in 6 weeks but in the interim I will have social behavioral health reach out to her for counseling.  Breztri renewed and a coupon voucher provided as well as a sample today  Continue to follow-up with oncology as scheduled.  No orders of the defined types were placed in this encounter.  No orders of the defined types were placed in this encounter.    Janora Norlander, DO [place] [phone]

## 2021-05-13 ENCOUNTER — Other Ambulatory Visit: Payer: Self-pay | Admitting: Family Medicine

## 2021-05-13 DIAGNOSIS — G2581 Restless legs syndrome: Secondary | ICD-10-CM

## 2021-05-13 DIAGNOSIS — J449 Chronic obstructive pulmonary disease, unspecified: Secondary | ICD-10-CM

## 2021-05-13 DIAGNOSIS — I479 Paroxysmal tachycardia, unspecified: Secondary | ICD-10-CM

## 2021-05-14 ENCOUNTER — Other Ambulatory Visit: Payer: Self-pay

## 2021-05-14 ENCOUNTER — Encounter: Payer: Self-pay | Admitting: Family Medicine

## 2021-05-14 ENCOUNTER — Ambulatory Visit (INDEPENDENT_AMBULATORY_CARE_PROVIDER_SITE_OTHER): Payer: Managed Care, Other (non HMO) | Admitting: Family Medicine

## 2021-05-14 VITALS — BP 100/66 | HR 90 | Temp 97.0°F | Ht 63.0 in | Wt 143.0 lb

## 2021-05-14 DIAGNOSIS — M25552 Pain in left hip: Secondary | ICD-10-CM | POA: Diagnosis not present

## 2021-05-14 DIAGNOSIS — R2689 Other abnormalities of gait and mobility: Secondary | ICD-10-CM | POA: Diagnosis not present

## 2021-05-14 MED ORDER — DICLOFENAC SODIUM 75 MG PO TBEC: 75.0000 mg | DELAYED_RELEASE_TABLET | Freq: Two times a day (BID) | ORAL | 0 refills | Status: AC

## 2021-05-14 NOTE — Progress Notes (Signed)
Assessment & Plan:  1. Balance problem Advised to stop Trazodone and see if she returns to normal since this was a new start. May possibly need a lower dosage.   2. Acute hip pain, left New onset. Rx'd NSAID. Education provided on hip pain. - diclofenac (VOLTAREN) 75 MG EC tablet; Take 1 tablet (75 mg total) by mouth 2 (two) times daily.  Dispense: 30 tablet; Refill: 0   Follow up plan: Return in about 1 week (around 05/21/2021) for Balance.  Hendricks Limes, MSN, APRN, FNP-C [facility]  Subjective:   Patient ID: Gabrielle Gallegos, female    DOB: 10-05-1955, 65 y.o.   MRN: [id number]  HPI: Gabrielle Gallegos is a 65 y.o. female presenting on 05/14/2021 for loss of balance  (X 4 days) and Hip Pain  Patient reports she has been feeling off balance, running into walls, and "weird" for the past 4 days. She denies dizziness and is "driving normally". She did recently start a new prescription of Trazodone at 50 mg at bedtime. She takes it around 9 PM and is not waking until 7 AM.   She also reports left hip pain that started around the same time. No falls or injuries. She has not taken anything for the pain.   ROS: Negative unless specifically indicated above in HPI.   Relevant past medical history reviewed and updated as indicated.   Allergies and medications reviewed and updated.   Current Outpatient Medications:    albuterol (VENTOLIN HFA) 108 (90 Base) MCG/ACT inhaler, INHALE 2 PUFFS INTO LUNGS EVERY 6 HOURS AS NEEDED FOR WHEEZING AND FOR SHORTNESS OF BREATH, Disp: 9 g, Rfl: 0   Budeson-Glycopyrrol-Formoterol (BREZTRI AEROSPHERE) 160-9-4.8 MCG/ACT AERO, Inhale 2 puffs into the lungs 2 (two) times daily., Disp: 10.7 g, Rfl: 11   carvedilol (COREG) 25 MG tablet, Take 1.5 tablets (37.5 mg total) by mouth 2 (two) times daily with a meal., Disp: 270 tablet, Rfl: 3   diazepam (VALIUM) 2 MG tablet, Take 2 mg by mouth every 8 (eight) hours as needed., Disp: , Rfl:     gabapentin (NEURONTIN) 300 MG capsule, TAKE 1 CAPSULE BY MOUTH THREE TIMES DAILY, Disp: 270 capsule, Rfl: 0   ibuprofen (ADVIL) 600 MG tablet, Take 1 tablet (600 mg total) by mouth every 8 (eight) hours as needed., Disp: 30 tablet, Rfl: 1   losartan (COZAAR) 100 MG tablet, Take 1 tablet (100 mg total) by mouth daily., Disp: 90 tablet, Rfl: 0   nicotine (NICODERM CQ - DOSED IN MG/24 HOURS) 21 mg/24hr patch, Place onto the skin., Disp: , Rfl:    omeprazole (PRILOSEC) 20 MG capsule, Take 1 capsule (20 mg total) by mouth daily., Disp: 90 capsule, Rfl: 0   pramipexole (MIRAPEX) 0.5 MG tablet, TAKE 1 TABLET BY MOUTH ONCE DAILY, MAY REPEAT ONCE DAILY IF NEEDED, Disp: 40 tablet, Rfl: 0   tiZANidine (ZANAFLEX) 4 MG tablet, TAKE 1 TABLET BY MOUTH EVERY 8 HOURS AS NEEDED FOR MUSCLE SPASM, Disp: 60 tablet, Rfl: prn   traZODone (DESYREL) 100 MG tablet, Take 0.5-1 tablets (50-100 mg total) by mouth at bedtime as needed for sleep., Disp: 90 tablet, Rfl: 3  Allergies  Allergen Reactions   Diltiazem Shortness Of Breath    , bloated   Metoprolol Nausea And Vomiting    Objective:   BP 100/66   Pulse 90   Temp (!) 97 F (36.1 C) (Temporal)   Ht 5\' 3"  (1.6 m)   Wt 143 lb (  64.9 kg)   BMI 25.33 kg/m    Physical Exam Vitals reviewed.  Constitutional:      General: She is not in acute distress.    Appearance: Normal appearance. She is not ill-appearing, toxic-appearing or diaphoretic.  HENT:     Head: Normocephalic and atraumatic.  Eyes:     General: No scleral icterus.       Right eye: No discharge.        Left eye: No discharge.     Conjunctiva/sclera: Conjunctivae normal.  Cardiovascular:     Rate and Rhythm: Normal rate.  Pulmonary:     Effort: Pulmonary effort is normal. No respiratory distress.  Musculoskeletal:        General: Normal range of motion.     Cervical back: Normal range of motion.     Left hip: Tenderness present. No deformity, lacerations or crepitus. Normal range of motion.  Normal strength.  Skin:    General: Skin is warm and dry.     Capillary Refill: Capillary refill takes less than 2 seconds.  Neurological:     General: No focal deficit present.     Mental Status: She is alert and oriented to person, place, and time. Mental status is at baseline.     Gait: Gait abnormal.  Psychiatric:        Mood and Affect: Mood normal.        Behavior: Behavior normal.        Thought Content: Thought content normal.        Judgment: Judgment normal.

## 2021-05-14 NOTE — Patient Instructions (Signed)
Stop taking Trazodone at bedtime.

## 2021-05-22 ENCOUNTER — Ambulatory Visit (INDEPENDENT_AMBULATORY_CARE_PROVIDER_SITE_OTHER): Payer: Managed Care, Other (non HMO) | Admitting: Family Medicine

## 2021-05-22 ENCOUNTER — Encounter: Payer: Self-pay | Admitting: Family Medicine

## 2021-05-22 ENCOUNTER — Ambulatory Visit (INDEPENDENT_AMBULATORY_CARE_PROVIDER_SITE_OTHER): Payer: Managed Care, Other (non HMO)

## 2021-05-22 ENCOUNTER — Other Ambulatory Visit: Payer: Self-pay

## 2021-05-22 VITALS — BP 139/81 | HR 97 | Temp 97.6°F | Ht 63.0 in | Wt 143.0 lb

## 2021-05-22 DIAGNOSIS — R2689 Other abnormalities of gait and mobility: Secondary | ICD-10-CM

## 2021-05-22 DIAGNOSIS — M25552 Pain in left hip: Secondary | ICD-10-CM

## 2021-05-22 MED ORDER — METHYLPREDNISOLONE ACETATE 40 MG/ML IJ SUSP
40.0000 mg | Freq: Once | INTRAMUSCULAR | Status: AC
  Administered 2021-05-22: 40 mg via INTRAMUSCULAR

## 2021-05-22 MED ORDER — PREDNISONE 10 MG (21) PO TBPK: ORAL_TABLET | ORAL | 0 refills | Status: DC

## 2021-05-22 NOTE — Progress Notes (Signed)
re  Subjective: VO:HYWVPXTG gait PCP: Gabrielle Norlander, DO GYI:RSWNI RADONNA BRACHER is a 65 y.o. female presenting to clinic today for:  1. Abnormal gait Reports abnormal gait in the setting of left hip pain.  This has been ongoing for about 3 weeks now.  No preceding injury.  She "perhaps slept wrong".  Reports some radiation of pain down left lower extremity.  This has been refractory to Voltaren.  No reports of sensory changes.   ROS: Per HPI  Allergies  Allergen Reactions   Diltiazem Shortness Of Breath    , bloated   Metoprolol Nausea And Vomiting   Past Medical History:  Diagnosis Date   Acute exacerbation of chronic obstructive pulmonary disease ([place])    05-27-2016 dx started on omnicef, oral prednisone   Bladder tumor    COPD (chronic obstructive pulmonary disease) ([place])    Depression    Fatigue    GAD (generalized anxiety disorder)    History of adenomatous polyp of colon    tubular adenoma's  10-30-2015   History of bladder stone    01/ 2008   History of chronic bronchitis    last bout 04-02-2016   History of kidney stones    History of panic attacks    Hyperlipidemia    Mixed stress and urge urinary incontinence    Nephrolithiasis    bilateral nonobstructive per ct 05/ 2017   Neuropathy    Productive cough    Renal cyst, left    RLS (restless legs syndrome)    Tachycardia 05/31/2014   Tachycardia, paroxysmal ([place])    last cardiologist-  dr Mare Ferrari  09/ 2015  felt to stress/ anxiety related   Urgency of urination    Wears glasses     Current Outpatient Medications:    albuterol (VENTOLIN HFA) 108 (90 Base) MCG/ACT inhaler, INHALE 2 PUFFS INTO LUNGS EVERY 6 HOURS AS NEEDED FOR WHEEZING AND FOR SHORTNESS OF BREATH, Disp: 9 g, Rfl: 0   Budeson-Glycopyrrol-Formoterol (BREZTRI AEROSPHERE) 160-9-4.8 MCG/ACT AERO, Inhale 2 puffs into the lungs 2 (two) times daily., Disp: 10.7 g, Rfl: 11   carvedilol (COREG) 25 MG tablet, Take 1.5 tablets (37.5 mg total) by  mouth 2 (two) times daily with a meal., Disp: 270 tablet, Rfl: 3   diazepam (VALIUM) 2 MG tablet, Take 2 mg by mouth every 8 (eight) hours as needed., Disp: , Rfl:    diclofenac (VOLTAREN) 75 MG EC tablet, Take 1 tablet (75 mg total) by mouth 2 (two) times daily., Disp: 30 tablet, Rfl: 0   gabapentin (NEURONTIN) 300 MG capsule, TAKE 1 CAPSULE BY MOUTH THREE TIMES DAILY, Disp: 270 capsule, Rfl: 0   losartan (COZAAR) 100 MG tablet, Take 1 tablet (100 mg total) by mouth daily., Disp: 90 tablet, Rfl: 0   nicotine (NICODERM CQ - DOSED IN MG/24 HOURS) 21 mg/24hr patch, Place onto the skin., Disp: , Rfl:    omeprazole (PRILOSEC) 20 MG capsule, Take 1 capsule (20 mg total) by mouth daily., Disp: 90 capsule, Rfl: 0   pramipexole (MIRAPEX) 0.5 MG tablet, TAKE 1 TABLET BY MOUTH ONCE DAILY, MAY REPEAT ONCE DAILY IF NEEDED, Disp: 40 tablet, Rfl: 0   tiZANidine (ZANAFLEX) 4 MG tablet, TAKE 1 TABLET BY MOUTH EVERY 8 HOURS AS NEEDED FOR MUSCLE SPASM, Disp: 60 tablet, Rfl: prn   traZODone (DESYREL) 100 MG tablet, Take 0.5-1 tablets (50-100 mg total) by mouth at bedtime as needed for sleep., Disp: 90 tablet, Rfl: 3 Social History   Socioeconomic  History   Marital status: Married    Spouse name: Not on file   Number of children: Not on file   Years of education: Not on file   Highest education level: Not on file  Occupational History   Not on file  Tobacco Use   Smoking status: Every Day    Packs/day: 0.10    Years: 30.00    Pack years: 3.00    Types: Cigarettes   Smokeless tobacco: Never  Vaping Use   Vaping Use: Never used  Substance and Sexual Activity   Alcohol use: No    Alcohol/week: 0.0 standard drinks   Drug use: No   Sexual activity: Not on file  Other Topics Concern   Not on file  Social History Narrative   Not on file   Social Determinants of Health   Financial Resource Strain: Not on file  Food Insecurity: Not on file  Transportation Needs: Not on file  Physical Activity: Not on  file  Stress: Not on file  Social Connections: Not on file  Intimate Partner Violence: Not on file   Family History  Problem Relation Age of Onset   Heart disease Mother    Cancer Father    Lung cancer Sister    Diabetes Brother     Objective: Office vital signs reviewed. BP 139/81   Pulse 97   Temp 97.6 F (36.4 C) (Temporal)   Ht 5\' 3"  (1.6 m)   Wt 143 lb (64.9 kg)   SpO2 96%   BMI 25.33 kg/m   Physical Examination:  Extremities: warm, well perfused, No edema, cyanosis or clubbing; +2 pulses bilaterally MSK: Antalgic gait and station.  Does not plan left leg down fully  Left hip: No tenderness to palpation over the trochanteric bursa.  Moderate pain with FABER, mild pain with FADIR.  She has point tenderness over the SI on the left.  No palpable abnormalities Neuro: Light touch sensation grossly intact  Assessment/ Plan: 65 y.o. female   Left hip pain - Plan: methylPREDNISolone acetate (DEPO-MEDROL) injection 40 mg, DG Hip Unilat W OR W/O Pelvis 2-3 Views Left, Ambulatory referral to Physical Therapy, Ambulatory referral to Orthopedic Surgery  Balance problem  I question SI etiology based on today's exam.  Depo-Medrol intramuscularly administered and she will start oral prednisone pack tomorrow.  Referral to physical therapy and orthopedics placed.  Personal review of her x-ray showed some arthritic changes but nothing obvious as an etiology of her symptoms.  Awaiting formal review by radiology  No orders of the defined types were placed in this encounter.  No orders of the defined types were placed in this encounter.    Gabrielle Norlander, DO [place] [phone]

## 2021-05-26 ENCOUNTER — Telehealth: Payer: Self-pay | Admitting: Family Medicine

## 2021-05-28 NOTE — Telephone Encounter (Signed)
Patient aware of results. Patient has appt with orthopedics in [place].  Patient would like to change this to [facility] in [place].

## 2021-06-02 ENCOUNTER — Other Ambulatory Visit: Payer: Self-pay | Admitting: Family Medicine

## 2021-06-02 DIAGNOSIS — M25552 Pain in left hip: Secondary | ICD-10-CM

## 2021-06-02 DIAGNOSIS — R2689 Other abnormalities of gait and mobility: Secondary | ICD-10-CM

## 2021-06-02 NOTE — Telephone Encounter (Signed)
done

## 2021-06-09 ENCOUNTER — Other Ambulatory Visit: Payer: Self-pay | Admitting: Family Medicine

## 2021-06-09 DIAGNOSIS — K2100 Gastro-esophageal reflux disease with esophagitis, without bleeding: Secondary | ICD-10-CM

## 2021-06-20 ENCOUNTER — Ambulatory Visit (HOSPITAL_BASED_OUTPATIENT_CLINIC_OR_DEPARTMENT_OTHER): Payer: Managed Care, Other (non HMO) | Admitting: Orthopaedic Surgery

## 2021-06-21 ENCOUNTER — Other Ambulatory Visit: Payer: Self-pay | Admitting: Family Medicine

## 2021-06-21 DIAGNOSIS — J449 Chronic obstructive pulmonary disease, unspecified: Secondary | ICD-10-CM

## 2021-06-21 DIAGNOSIS — I479 Paroxysmal tachycardia, unspecified: Secondary | ICD-10-CM

## 2021-07-11 ENCOUNTER — Other Ambulatory Visit: Payer: Self-pay | Admitting: Family Medicine

## 2021-07-11 DIAGNOSIS — G2581 Restless legs syndrome: Secondary | ICD-10-CM

## 2021-08-12 ENCOUNTER — Ambulatory Visit (INDEPENDENT_AMBULATORY_CARE_PROVIDER_SITE_OTHER): Payer: Managed Care, Other (non HMO) | Admitting: Family Medicine

## 2021-08-12 DIAGNOSIS — R109 Unspecified abdominal pain: Secondary | ICD-10-CM

## 2021-08-12 DIAGNOSIS — N200 Calculus of kidney: Secondary | ICD-10-CM | POA: Diagnosis not present

## 2021-08-12 MED ORDER — TAMSULOSIN HCL 0.4 MG PO CAPS: 0.4000 mg | ORAL_CAPSULE | Freq: Every day | ORAL | 3 refills | Status: DC

## 2021-08-12 MED ORDER — CEPHALEXIN 500 MG PO CAPS: 500.0000 mg | ORAL_CAPSULE | Freq: Four times a day (QID) | ORAL | 0 refills | Status: AC

## 2021-08-12 NOTE — Progress Notes (Signed)
Telephone visit  Subjective: CC: flank pain PCP: Janora Norlander, DO DHR:CBULA NEMESIS RAINWATER is a 65 y.o. female calls for telephone consult today. Patient provides verbal consent for consult held via phone.  Due to COVID-19 pandemic this visit was conducted virtually. This visit type was conducted due to national recommendations for restrictions regarding the COVID-19 Pandemic (e.g. social distancing, sheltering in place) in an effort to limit this patient's exposure and mitigate transmission in our community. All issues noted in this document were discussed and addressed.  A physical exam was not performed with this format.   Location of patient: home Location of provider: [facility] Others present for call: none  1. Renal stone Patient reports that a right-sided renal stone was noted on recent CT scan.  Over the last week or so, she has been developing some right flank pain that is worse with movement.  She is reported urinary urgency, dark urine.  No hematuria, fevers or nausea or vomiting but she is had some pelvic discomfort.  This seems consistent with her typical UTI/renal stone.  She is required a stent once in the past for her renal stone.  She is not currently established with a urologist anymore however because she has been able to pass most of them on her own.  She is hydrating without difficulty.  She has another CT scan scheduled in 1 week   ROS: Per HPI  Allergies  Allergen Reactions   Diltiazem Shortness Of Breath    , bloated   Metoprolol Nausea And Vomiting   Past Medical History:  Diagnosis Date   Acute exacerbation of chronic obstructive pulmonary disease ([place])    05-27-2016 dx started on omnicef, oral prednisone   Bladder tumor    COPD (chronic obstructive pulmonary disease) ([place])    Depression    Fatigue    GAD (generalized anxiety disorder)    History of adenomatous polyp of colon    tubular adenoma's  10-30-2015   History of bladder stone    01/ 2008    History of chronic bronchitis    last bout 04-02-2016   History of kidney stones    History of panic attacks    Hyperlipidemia    Mixed stress and urge urinary incontinence    Nephrolithiasis    bilateral nonobstructive per ct 05/ 2017   Neuropathy    Productive cough    Renal cyst, left    RLS (restless legs syndrome)    Tachycardia 05/31/2014   Tachycardia, paroxysmal ([place])    last cardiologist-  dr Mare Ferrari  09/ 2015  felt to stress/ anxiety related   Urgency of urination    Wears glasses     Current Outpatient Medications:    albuterol (VENTOLIN HFA) 108 (90 Base) MCG/ACT inhaler, INHALE 2 PUFFS INTO LUNGS EVERY 6 HOURS AS NEEDED FOR WHEEZING AND FOR SHORTNESS OF BREATH, Disp: 9 g, Rfl: 2   Budeson-Glycopyrrol-Formoterol (BREZTRI AEROSPHERE) 160-9-4.8 MCG/ACT AERO, Inhale 2 puffs into the lungs 2 (two) times daily., Disp: 10.7 g, Rfl: 11   carvedilol (COREG) 25 MG tablet, Take 1.5 tablets (37.5 mg total) by mouth 2 (two) times daily with a meal., Disp: 270 tablet, Rfl: 3   diazepam (VALIUM) 2 MG tablet, Take 2 mg by mouth every 8 (eight) hours as needed., Disp: , Rfl:    diclofenac (VOLTAREN) 75 MG EC tablet, Take 1 tablet (75 mg total) by mouth 2 (two) times daily., Disp: 30 tablet, Rfl: 0   gabapentin (NEURONTIN) 300 MG  capsule, TAKE 1 CAPSULE BY MOUTH THREE TIMES DAILY, Disp: 270 capsule, Rfl: 0   losartan (COZAAR) 100 MG tablet, Take 1 tablet (100 mg total) by mouth daily., Disp: 90 tablet, Rfl: 0   nicotine (NICODERM CQ - DOSED IN MG/24 HOURS) 21 mg/24hr patch, Place onto the skin., Disp: , Rfl:    omeprazole (PRILOSEC) 20 MG capsule, Take 1 capsule by mouth once daily, Disp: 90 capsule, Rfl: 3   pramipexole (MIRAPEX) 0.5 MG tablet, TAKE 1 TABLET BY MOUTH ONCE DAILY, MAY REPEAT ONCE DAILT IF NEEDED, Disp: 40 tablet, Rfl: 0   predniSONE (STERAPRED UNI-PAK 21 TAB) 10 MG (21) TBPK tablet, As directed x 6 days. Start 9/16., Disp: 21 tablet, Rfl: 0   tiZANidine (ZANAFLEX) 4 MG  tablet, TAKE 1 TABLET BY MOUTH EVERY 8 HOURS AS NEEDED FOR MUSCLE SPASM, Disp: 60 tablet, Rfl: prn   traZODone (DESYREL) 100 MG tablet, Take 0.5-1 tablets (50-100 mg total) by mouth at bedtime as needed for sleep., Disp: 90 tablet, Rfl: 3  Assessment/ Plan: 65 y.o. female   Right flank pain - Plan: tamsulosin (FLOMAX) 0.4 MG CAPS capsule, cephALEXin (KEFLEX) 500 MG capsule  Nephrolithiasis - Plan: tamsulosin (FLOMAX) 0.4 MG CAPS capsule, cephALEXin (KEFLEX) 500 MG capsule  Empiric treatment for urinary tract infection associated with nephrolithiasis.  I have sent in Flomax.  I think that she will be able to passes 4 mm stone as she has been able to do this in the past.  She is not established with urology currently but we can certainly place an urgent referral if needed.  She will attempt to pass over the next couple of days.  We discussed red flag signs and symptoms warranting further evaluation.  She was good understanding.   Start time: 11:06am (no answer);12:09 pm End time: 12:16pm  Total time spent on patient care (including telephone call/ virtual visit): 7 minutes  [place], [place] [phone]

## 2021-09-05 ENCOUNTER — Other Ambulatory Visit: Payer: Self-pay | Admitting: Family Medicine

## 2021-09-05 DIAGNOSIS — G2581 Restless legs syndrome: Secondary | ICD-10-CM

## 2021-09-08 ENCOUNTER — Other Ambulatory Visit: Payer: Self-pay | Admitting: Nurse Practitioner

## 2021-09-08 ENCOUNTER — Other Ambulatory Visit: Payer: Self-pay | Admitting: Family Medicine

## 2021-09-08 DIAGNOSIS — J449 Chronic obstructive pulmonary disease, unspecified: Secondary | ICD-10-CM

## 2021-09-08 DIAGNOSIS — I10 Essential (primary) hypertension: Secondary | ICD-10-CM

## 2021-09-08 DIAGNOSIS — I479 Paroxysmal tachycardia, unspecified: Secondary | ICD-10-CM

## 2021-09-10 ENCOUNTER — Other Ambulatory Visit: Payer: Self-pay | Admitting: Nurse Practitioner

## 2021-09-10 ENCOUNTER — Other Ambulatory Visit: Payer: Self-pay | Admitting: Family Medicine

## 2021-09-10 DIAGNOSIS — G2581 Restless legs syndrome: Secondary | ICD-10-CM

## 2021-09-10 DIAGNOSIS — I10 Essential (primary) hypertension: Secondary | ICD-10-CM

## 2021-10-10 ENCOUNTER — Other Ambulatory Visit: Payer: Self-pay | Admitting: *Deleted

## 2021-10-10 DIAGNOSIS — I10 Essential (primary) hypertension: Secondary | ICD-10-CM

## 2021-10-10 DIAGNOSIS — G2581 Restless legs syndrome: Secondary | ICD-10-CM

## 2021-10-10 DIAGNOSIS — F4329 Adjustment disorder with other symptoms: Secondary | ICD-10-CM

## 2021-10-10 DIAGNOSIS — R000 Tachycardia, unspecified: Secondary | ICD-10-CM

## 2021-10-10 DIAGNOSIS — K2100 Gastro-esophageal reflux disease with esophagitis, without bleeding: Secondary | ICD-10-CM

## 2021-10-10 DIAGNOSIS — F5104 Psychophysiologic insomnia: Secondary | ICD-10-CM

## 2021-10-10 MED ORDER — OMEPRAZOLE 20 MG PO CPDR: 20.0000 mg | DELAYED_RELEASE_CAPSULE | Freq: Every day | ORAL | 1 refills | Status: AC

## 2021-10-10 MED ORDER — TRAZODONE HCL 100 MG PO TABS: 50.0000 mg | ORAL_TABLET | Freq: Every evening | ORAL | 1 refills | Status: DC | PRN

## 2021-10-12 MED ORDER — PRAMIPEXOLE DIHYDROCHLORIDE 0.5 MG PO TABS: ORAL_TABLET | ORAL | 0 refills | Status: DC

## 2021-10-12 MED ORDER — LOSARTAN POTASSIUM 100 MG PO TABS: 100.0000 mg | ORAL_TABLET | Freq: Every day | ORAL | 2 refills | Status: AC

## 2021-10-12 MED ORDER — GABAPENTIN 300 MG PO CAPS
300.0000 mg | ORAL_CAPSULE | Freq: Three times a day (TID) | ORAL | 1 refills | Status: DC
Start: 2021-10-12 — End: 2022-02-06

## 2021-10-12 MED ORDER — CARVEDILOL 25 MG PO TABS: 37.5000 mg | ORAL_TABLET | Freq: Two times a day (BID) | ORAL | 2 refills | Status: DC

## 2021-10-12 MED ORDER — TIZANIDINE HCL 4 MG PO TABS: ORAL_TABLET | ORAL | 99 refills | Status: AC

## 2021-11-26 ENCOUNTER — Other Ambulatory Visit: Payer: Self-pay | Admitting: Family Medicine

## 2021-11-26 DIAGNOSIS — F4329 Adjustment disorder with other symptoms: Secondary | ICD-10-CM

## 2021-12-22 ENCOUNTER — Other Ambulatory Visit: Payer: Self-pay | Admitting: Family Medicine

## 2021-12-22 ENCOUNTER — Telehealth: Payer: Self-pay | Admitting: Family Medicine

## 2021-12-22 DIAGNOSIS — G2581 Restless legs syndrome: Secondary | ICD-10-CM

## 2021-12-22 MED ORDER — PRAMIPEXOLE DIHYDROCHLORIDE 0.5 MG PO TABS: ORAL_TABLET | ORAL | 12 refills | Status: AC

## 2021-12-22 NOTE — Telephone Encounter (Signed)
sent 

## 2021-12-22 NOTE — Telephone Encounter (Signed)
?  Prescription Request ? ?12/22/2021 ? ?Is this a "Controlled Substance" medicine? NO ? ?Have you seen your PCP in the last 2 weeks? NO, pt made appt for  first available with Dr. Darnell Level. On 01/13/22 she is out of this medication  ? ?If YES, route message to pool  -  If NO, patient needs to be scheduled for appointment. ? ?What is the name of the medication or equipment? pramipexole (MIRAPEX) 0.5 MG tablet [Pharmacy Med Name: PRAMIPEXOLE DIHYDROCHLORIDE TABS 0.5MG ] ? ?Have you contacted your pharmacy to request a refill? YES  ? ?Which pharmacy would you like this sent to? [facility]  ? ? ?Patient notified that their request is being sent to the clinical staff for review and that they should receive a response within 2 business days.  ? ? ?

## 2021-12-22 NOTE — Telephone Encounter (Signed)
Pt aware.

## 2021-12-22 NOTE — Telephone Encounter (Signed)
[facility]. NTBS mail order NOT sent ?

## 2022-01-13 ENCOUNTER — Other Ambulatory Visit: Payer: Self-pay | Admitting: Family Medicine

## 2022-01-13 ENCOUNTER — Ambulatory Visit: Payer: Managed Care, Other (non HMO) | Admitting: Family Medicine

## 2022-01-13 DIAGNOSIS — F4329 Adjustment disorder with other symptoms: Secondary | ICD-10-CM

## 2022-01-22 ENCOUNTER — Ambulatory Visit: Payer: Managed Care, Other (non HMO) | Admitting: Family Medicine

## 2022-02-05 ENCOUNTER — Other Ambulatory Visit: Payer: Self-pay | Admitting: *Deleted

## 2022-02-05 DIAGNOSIS — R109 Unspecified abdominal pain: Secondary | ICD-10-CM

## 2022-02-05 DIAGNOSIS — N200 Calculus of kidney: Secondary | ICD-10-CM

## 2022-02-05 NOTE — Telephone Encounter (Signed)
[facility]. NTBS last OV 08/12/21. Cancelled OVs in May. Mail order NOT sent

## 2022-02-06 MED ORDER — GABAPENTIN 300 MG PO CAPS: 300.0000 mg | ORAL_CAPSULE | Freq: Three times a day (TID) | ORAL | 0 refills | Status: AC

## 2022-02-06 MED ORDER — TAMSULOSIN HCL 0.4 MG PO CAPS: 0.4000 mg | ORAL_CAPSULE | Freq: Every day | ORAL | 0 refills | Status: AC

## 2022-02-06 NOTE — Addendum Note (Signed)
Addended by: Antonietta Barcelona D on: 02/06/2022 03:15 PM   Modules accepted: Orders

## 2022-02-06 NOTE — Telephone Encounter (Signed)
Appt made on 02-19-2022 w/Dr Lajuana Ripple for video bc pt has cancer & she doing chemo.

## 2022-02-09 ENCOUNTER — Other Ambulatory Visit: Payer: Self-pay | Admitting: *Deleted

## 2022-02-09 DIAGNOSIS — F4329 Adjustment disorder with other symptoms: Secondary | ICD-10-CM

## 2022-02-09 DIAGNOSIS — I479 Paroxysmal tachycardia, unspecified: Secondary | ICD-10-CM

## 2022-02-09 DIAGNOSIS — J449 Chronic obstructive pulmonary disease, unspecified: Secondary | ICD-10-CM

## 2022-02-09 MED ORDER — ALBUTEROL SULFATE HFA 108 (90 BASE) MCG/ACT IN AERS: INHALATION_SPRAY | RESPIRATORY_TRACT | 0 refills | Status: AC

## 2022-02-09 MED ORDER — BREZTRI AEROSPHERE 160-9-4.8 MCG/ACT IN AERO: 2.0000 | INHALATION_SPRAY | Freq: Two times a day (BID) | RESPIRATORY_TRACT | 0 refills | Status: DC

## 2022-02-16 ENCOUNTER — Other Ambulatory Visit (HOSPITAL_COMMUNITY): Payer: Self-pay | Admitting: *Deleted

## 2022-02-16 ENCOUNTER — Telehealth (INDEPENDENT_AMBULATORY_CARE_PROVIDER_SITE_OTHER): Payer: Managed Care, Other (non HMO) | Admitting: Family Medicine

## 2022-02-16 DIAGNOSIS — R296 Repeated falls: Secondary | ICD-10-CM

## 2022-02-16 DIAGNOSIS — C7931 Secondary malignant neoplasm of brain: Secondary | ICD-10-CM | POA: Diagnosis not present

## 2022-02-16 DIAGNOSIS — J449 Chronic obstructive pulmonary disease, unspecified: Secondary | ICD-10-CM

## 2022-02-16 DIAGNOSIS — M25562 Pain in left knee: Secondary | ICD-10-CM | POA: Diagnosis not present

## 2022-02-16 DIAGNOSIS — C3492 Malignant neoplasm of unspecified part of left bronchus or lung: Secondary | ICD-10-CM | POA: Insufficient documentation

## 2022-02-16 DIAGNOSIS — F5104 Psychophysiologic insomnia: Secondary | ICD-10-CM

## 2022-02-16 MED ORDER — TRAZODONE HCL 100 MG PO TABS: 50.0000 mg | ORAL_TABLET | Freq: Every evening | ORAL | 1 refills | Status: AC | PRN

## 2022-02-16 MED ORDER — BREZTRI AEROSPHERE 160-9-4.8 MCG/ACT IN AERO: 2.0000 | INHALATION_SPRAY | Freq: Two times a day (BID) | RESPIRATORY_TRACT | 3 refills | Status: AC

## 2022-02-16 NOTE — Progress Notes (Signed)
Telephone visit  Subjective: CC:med follow up PCP: Janora Norlander, DO IOE:VOJJK Gabrielle Gallegos is a 66 y.o. female. Patient provides verbal consent for consult held via phone.  Due to COVID-19 pandemic this visit was conducted virtually. This visit type was conducted due to national recommendations for restrictions regarding the COVID-19 Pandemic (e.g. social distancing, sheltering in place) in an effort to limit this patient's exposure and mitigate transmission in our community. All issues noted in this document were discussed and addressed.  A physical exam was not performed with this format.   Location of patient: home Location of provider: [facility] Others present for call: none  1. Recurrent lung cancer Relying on her walker/ wheelchair for ambulation.  Reports frequent falls.   She injured her left knee and reports swelling and instability in that left knee.  She'll be seeing her cancer specialist on 02/19/2022 for [place].  She reports chronic dyspnea.  She has mets to the brain that were treated with radiation.  No surgical plans at this time.   ROS: Per HPI  Allergies  Allergen Reactions   Diltiazem Shortness Of Breath    , bloated   Metoprolol Nausea And Vomiting   Past Medical History:  Diagnosis Date   Acute exacerbation of chronic obstructive pulmonary disease ([place])    05-27-2016 dx started on omnicef, oral prednisone   Bladder tumor    COPD (chronic obstructive pulmonary disease) ([place])    Depression    Fatigue    GAD (generalized anxiety disorder)    History of adenomatous polyp of colon    tubular adenoma's  10-30-2015   History of bladder stone    01/ 2008   History of chronic bronchitis    last bout 04-02-2016   History of kidney stones    History of panic attacks    Hyperlipidemia    Mixed stress and urge urinary incontinence    Nephrolithiasis    bilateral nonobstructive per ct 05/ 2017   Neuropathy    Productive cough    Renal cyst, left    RLS  (restless legs syndrome)    Tachycardia 05/31/2014   Tachycardia, paroxysmal ([place])    last cardiologist-  dr Mare Ferrari  09/ 2015  felt to stress/ anxiety related   Urgency of urination    Wears glasses     Current Outpatient Medications:    albuterol (VENTOLIN HFA) 108 (90 Base) MCG/ACT inhaler, INHALE 2 PUFFS BY MOUTH EVERY 6 HOURS AS NEEDED FOR WHEEZING AND FOR SHORTNESS OF BREATH, Disp: 20.1 g, Rfl: 0   Budeson-Glycopyrrol-Formoterol (BREZTRI AEROSPHERE) 160-9-4.8 MCG/ACT AERO, Inhale 2 puffs into the lungs 2 (two) times daily., Disp: 32.1 g, Rfl: 0   carvedilol (COREG) 25 MG tablet, Take 1.5 tablets (37.5 mg total) by mouth 2 (two) times daily with a meal., Disp: 270 tablet, Rfl: 2   diazepam (VALIUM) 2 MG tablet, Take 2 mg by mouth every 8 (eight) hours as needed., Disp: , Rfl:    diclofenac (VOLTAREN) 75 MG EC tablet, Take 1 tablet (75 mg total) by mouth 2 (two) times daily., Disp: 30 tablet, Rfl: 0   gabapentin (NEURONTIN) 300 MG capsule, Take 1 capsule (300 mg total) by mouth 3 (three) times daily., Disp: 270 capsule, Rfl: 0   losartan (COZAAR) 100 MG tablet, Take 1 tablet (100 mg total) by mouth daily., Disp: 90 tablet, Rfl: 2   nicotine (NICODERM CQ - DOSED IN MG/24 HOURS) 21 mg/24hr patch, Place onto the skin., Disp: , Rfl:  omeprazole (PRILOSEC) 20 MG capsule, Take 1 capsule (20 mg total) by mouth daily., Disp: 90 capsule, Rfl: 1   pramipexole (MIRAPEX) 0.5 MG tablet, TAKE 1 TABLET BY MOUTH ONCE DAILY MAY  REPEAT  ONCE  DAILY  IF  NEEDED, Disp: 40 tablet, Rfl: 12   tamsulosin (FLOMAX) 0.4 MG CAPS capsule, Take 1 capsule (0.4 mg total) by mouth daily., Disp: 90 capsule, Rfl: 0   tiZANidine (ZANAFLEX) 4 MG tablet, TAKE 1 TABLET BY MOUTH EVERY 8 HOURS AS NEEDED FOR MUSCLE SPASM, Disp: 60 tablet, Rfl: prn   traZODone (DESYREL) 100 MG tablet, Take 0.5-1 tablets (50-100 mg total) by mouth at bedtime as needed for sleep., Disp: 90 tablet, Rfl: 1  Assessment/ Plan: 66 y.o. 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  Uncertain if this is related to the "mini strokes" she reports or metastases or general deconditioning in the setting of chronic illness.  I ordered an x-ray of the left knee but apparently the cancer center at [facility] is not willing to collect this from an outside provider so I have asked that she reach out to her cancer specialist to see if perhaps they might order it for her when she goes for her next treatment  Her inhaler has been renewed as well as her sleep aid.  She did not need refills on anything else at this time  Start time: 10:51am (sent link); 10:52am (LVM); 11:15am  End time: 11:37am  Total time spent on patient care (including video visit/ documentation): 22 minutes  [place], [place] [phone]

## 2022-02-19 ENCOUNTER — Ambulatory Visit: Payer: Managed Care, Other (non HMO) | Admitting: Family Medicine

## 2022-02-19 ENCOUNTER — Telehealth: Payer: Managed Care, Other (non HMO) | Admitting: Family Medicine

## 2022-02-21 ENCOUNTER — Other Ambulatory Visit: Payer: Self-pay | Admitting: Family Medicine

## 2022-02-21 DIAGNOSIS — R000 Tachycardia, unspecified: Secondary | ICD-10-CM

## 2022-02-23 ENCOUNTER — Other Ambulatory Visit: Payer: Self-pay | Admitting: Family Medicine

## 2022-02-23 DIAGNOSIS — F4329 Adjustment disorder with other symptoms: Secondary | ICD-10-CM

## 2022-03-07 DEATH — deceased
